# Patient Record
Sex: Female | Born: 1945
Health system: Southern US, Community
[De-identification: ages and names within clinical notes are randomized; demographics above are authoritative.]

## PROBLEM LIST (undated history)

## (undated) ENCOUNTER — Emergency Department (HOSPITAL_COMMUNITY): Disposition: A | Discharge status: Home / Self Care | Payer: Self-pay

## (undated) DIAGNOSIS — E785 Hyperlipidemia, unspecified: Secondary | ICD-10-CM

## (undated) DIAGNOSIS — I10 Essential (primary) hypertension: Secondary | ICD-10-CM

## (undated) DIAGNOSIS — I639 Cerebral infarction, unspecified: Secondary | ICD-10-CM

## (undated) DIAGNOSIS — B1920 Unspecified viral hepatitis C without hepatic coma: Secondary | ICD-10-CM

## (undated) DIAGNOSIS — E079 Disorder of thyroid, unspecified: Secondary | ICD-10-CM

## (undated) DIAGNOSIS — T7840XA Allergy, unspecified, initial encounter: Secondary | ICD-10-CM

## (undated) DIAGNOSIS — Z9221 Personal history of antineoplastic chemotherapy: Secondary | ICD-10-CM

## (undated) DIAGNOSIS — C801 Malignant (primary) neoplasm, unspecified: Secondary | ICD-10-CM

## (undated) HISTORY — DX: Malignant (primary) neoplasm, unspecified: C80.1

## (undated) HISTORY — DX: Hyperlipidemia, unspecified: E78.5

## (undated) HISTORY — DX: Allergy, unspecified, initial encounter: T78.40XA

## (undated) HISTORY — DX: Unspecified viral hepatitis C without hepatic coma: B19.20

## (undated) HISTORY — PX: FRACTURE SURGERY: SHX138

## (undated) HISTORY — DX: Disorder of thyroid, unspecified: E07.9

## (undated) HISTORY — PX: MASTECTOMY: SHX3

## (undated) HISTORY — PX: LEG SURGERY: SHX1003

## (undated) HISTORY — PX: BREAST SURGERY: SHX581

---

## 2003-08-16 HISTORY — PX: KNEE SURGERY: SHX244

## 2004-04-15 ENCOUNTER — Emergency Department (HOSPITAL_COMMUNITY): Admission: EM | Admit: 2004-04-15 | Discharge: 2004-04-16 | Payer: Self-pay | Admitting: Emergency Medicine

## 2004-04-20 ENCOUNTER — Ambulatory Visit (HOSPITAL_COMMUNITY): Admission: RE | Admit: 2004-04-20 | Discharge: 2004-04-20 | Payer: Self-pay | Admitting: Orthopedic Surgery

## 2004-05-05 ENCOUNTER — Observation Stay (HOSPITAL_COMMUNITY): Admission: RE | Admit: 2004-05-05 | Discharge: 2004-05-07 | Payer: Self-pay | Admitting: Orthopedic Surgery

## 2006-08-15 HISTORY — PX: WRIST SURGERY: SHX841

## 2007-05-12 ENCOUNTER — Inpatient Hospital Stay (HOSPITAL_COMMUNITY): Admission: EM | Admit: 2007-05-12 | Discharge: 2007-05-17 | Payer: Self-pay | Admitting: Emergency Medicine

## 2007-05-14 ENCOUNTER — Encounter (INDEPENDENT_AMBULATORY_CARE_PROVIDER_SITE_OTHER): Payer: Self-pay | Admitting: Internal Medicine

## 2007-05-15 ENCOUNTER — Ambulatory Visit: Payer: Self-pay | Admitting: Physical Medicine & Rehabilitation

## 2007-05-24 ENCOUNTER — Ambulatory Visit: Payer: Self-pay | Admitting: Family Medicine

## 2007-05-24 ENCOUNTER — Encounter (INDEPENDENT_AMBULATORY_CARE_PROVIDER_SITE_OTHER): Payer: Self-pay | Admitting: Nurse Practitioner

## 2007-05-24 LAB — CONVERTED CEMR LAB
ALT: 61 units/L — ABNORMAL HIGH (ref 0–35)
AST: 54 units/L — ABNORMAL HIGH (ref 0–37)
Albumin: 4.3 g/dL (ref 3.5–5.2)
Alkaline Phosphatase: 96 units/L (ref 39–117)
BUN: 10 mg/dL (ref 6–23)
Basophils Relative: 0 % (ref 0–1)
Creatinine, Ser: 0.55 mg/dL (ref 0.40–1.20)
Eosinophils Absolute: 0.1 10*3/uL (ref 0.0–0.7)
Glucose, Bld: 88 mg/dL (ref 70–99)
HCT: 43.9 % (ref 36.0–46.0)
Hemoglobin: 15.2 g/dL — ABNORMAL HIGH (ref 12.0–15.0)
Lymphs Abs: 3.8 10*3/uL — ABNORMAL HIGH (ref 0.7–3.3)
MCHC: 34.6 g/dL (ref 30.0–36.0)
MCV: 99.3 fL (ref 78.0–100.0)
Neutro Abs: 7.5 10*3/uL (ref 1.7–7.7)
Neutrophils Relative %: 62 % (ref 43–77)
Platelets: 291 10*3/uL (ref 150–400)
RDW: 12 % (ref 11.5–14.0)
WBC: 12.1 10*3/uL — ABNORMAL HIGH (ref 4.0–10.5)

## 2007-06-14 ENCOUNTER — Encounter (INDEPENDENT_AMBULATORY_CARE_PROVIDER_SITE_OTHER): Payer: Self-pay | Admitting: Nurse Practitioner

## 2007-06-14 ENCOUNTER — Ambulatory Visit: Payer: Self-pay | Admitting: *Deleted

## 2007-06-14 ENCOUNTER — Ambulatory Visit: Payer: Self-pay | Admitting: Family Medicine

## 2007-06-25 ENCOUNTER — Encounter: Admission: RE | Admit: 2007-06-25 | Discharge: 2007-06-25 | Payer: Self-pay | Admitting: Nurse Practitioner

## 2007-06-29 ENCOUNTER — Encounter: Admission: RE | Admit: 2007-06-29 | Discharge: 2007-06-29 | Payer: Self-pay | Admitting: Nurse Practitioner

## 2007-07-07 ENCOUNTER — Inpatient Hospital Stay (HOSPITAL_COMMUNITY): Admission: EM | Admit: 2007-07-07 | Discharge: 2007-07-13 | Payer: Self-pay | Admitting: Emergency Medicine

## 2007-08-28 ENCOUNTER — Encounter: Admission: RE | Admit: 2007-08-28 | Discharge: 2007-11-26 | Payer: Self-pay | Admitting: Orthopedic Surgery

## 2007-11-06 ENCOUNTER — Encounter: Admission: RE | Admit: 2007-11-06 | Discharge: 2007-11-13 | Payer: Self-pay | Admitting: Nurse Practitioner

## 2007-11-22 ENCOUNTER — Ambulatory Visit: Payer: Self-pay | Admitting: Internal Medicine

## 2007-12-18 ENCOUNTER — Ambulatory Visit: Payer: Self-pay | Admitting: Internal Medicine

## 2007-12-21 ENCOUNTER — Encounter: Admission: RE | Admit: 2007-12-21 | Discharge: 2007-12-21 | Payer: Self-pay | Admitting: Family Medicine

## 2008-01-01 ENCOUNTER — Ambulatory Visit: Payer: Self-pay | Admitting: Family Medicine

## 2008-03-19 ENCOUNTER — Ambulatory Visit: Payer: Self-pay | Admitting: Family Medicine

## 2008-03-19 LAB — CONVERTED CEMR LAB
Albumin: 4 g/dL (ref 3.5–5.2)
Basophils Absolute: 0 10*3/uL (ref 0.0–0.1)
Basophils Relative: 0 % (ref 0–1)
Calcium: 9.2 mg/dL (ref 8.4–10.5)
Eosinophils Absolute: 0.1 10*3/uL (ref 0.0–0.7)
Eosinophils Relative: 1 % (ref 0–5)
Glucose, Bld: 87 mg/dL (ref 70–99)
HDL: 68 mg/dL (ref >39)
MCV: 102.7 fL — ABNORMAL HIGH (ref 78.0–100.0)
Monocytes Absolute: 0.8 10*3/uL (ref 0.1–1.0)
Monocytes Relative: 9 % (ref 3–12)
Neutro Abs: 5.8 10*3/uL (ref 1.7–7.7)
Neutrophils Relative %: 63 % (ref 43–77)
Platelets: 254 10*3/uL (ref 150–400)
RBC: 4.41 M/uL (ref 3.87–5.11)
RDW: 13.3 % (ref 11.5–15.5)
Sodium: 133 meq/L — ABNORMAL LOW (ref 135–145)
TSH: 3.783 microintl units/mL (ref 0.350–4.50)
Total CHOL/HDL Ratio: 2.5
Total Protein: 7.7 g/dL (ref 6.0–8.3)
Triglycerides: 65 mg/dL (ref <150)
VLDL: 13 mg/dL (ref 0–40)

## 2008-03-20 ENCOUNTER — Encounter (INDEPENDENT_AMBULATORY_CARE_PROVIDER_SITE_OTHER): Payer: Self-pay | Admitting: Family Medicine

## 2008-04-02 ENCOUNTER — Ambulatory Visit: Payer: Self-pay | Admitting: Internal Medicine

## 2008-05-02 ENCOUNTER — Ambulatory Visit (HOSPITAL_BASED_OUTPATIENT_CLINIC_OR_DEPARTMENT_OTHER): Admission: RE | Admit: 2008-05-02 | Discharge: 2008-05-02 | Payer: Self-pay | Admitting: General Surgery

## 2008-06-30 ENCOUNTER — Encounter: Admission: RE | Admit: 2008-06-30 | Discharge: 2008-06-30 | Payer: Self-pay | Admitting: Family Medicine

## 2008-07-31 ENCOUNTER — Ambulatory Visit: Payer: Self-pay | Admitting: Internal Medicine

## 2008-12-03 ENCOUNTER — Ambulatory Visit: Payer: Self-pay | Admitting: Internal Medicine

## 2008-12-05 ENCOUNTER — Encounter (INDEPENDENT_AMBULATORY_CARE_PROVIDER_SITE_OTHER): Payer: Self-pay | Admitting: *Deleted

## 2008-12-08 ENCOUNTER — Ambulatory Visit: Payer: Self-pay | Admitting: Internal Medicine

## 2009-03-27 ENCOUNTER — Ambulatory Visit: Payer: Self-pay | Admitting: Internal Medicine

## 2009-03-27 ENCOUNTER — Encounter (INDEPENDENT_AMBULATORY_CARE_PROVIDER_SITE_OTHER): Payer: Self-pay | Admitting: Internal Medicine

## 2009-03-27 LAB — CONVERTED CEMR LAB
Chloride: 98 meq/L (ref 96–112)
Microalb, Ur: 0.5 mg/dL (ref 0.00–1.89)

## 2009-04-22 ENCOUNTER — Ambulatory Visit: Payer: Self-pay | Admitting: Internal Medicine

## 2009-04-23 ENCOUNTER — Encounter: Payer: Self-pay | Admitting: Family Medicine

## 2009-04-23 LAB — CONVERTED CEMR LAB
BUN: 18 mg/dL (ref 6–23)
CO2: 23 meq/L (ref 19–32)
Calcium: 9.4 mg/dL (ref 8.4–10.5)
Chloride: 98 meq/L (ref 96–112)
Creatinine, Ser: 0.6 mg/dL (ref 0.40–1.20)
Glucose, Bld: 111 mg/dL — ABNORMAL HIGH (ref 70–99)
Sodium: 133 meq/L — ABNORMAL LOW (ref 135–145)

## 2009-05-21 ENCOUNTER — Encounter (INDEPENDENT_AMBULATORY_CARE_PROVIDER_SITE_OTHER): Payer: Self-pay | Admitting: Internal Medicine

## 2009-05-21 ENCOUNTER — Ambulatory Visit: Payer: Self-pay | Admitting: Internal Medicine

## 2009-05-21 LAB — CONVERTED CEMR LAB
Calcium: 9.5 mg/dL (ref 8.4–10.5)
Chloride: 103 meq/L (ref 96–112)
Creatinine, Ser: 0.6 mg/dL (ref 0.40–1.20)
Glucose, Bld: 96 mg/dL (ref 70–99)
Sodium: 136 meq/L (ref 135–145)

## 2009-06-26 ENCOUNTER — Ambulatory Visit: Payer: Self-pay | Admitting: Internal Medicine

## 2009-06-26 ENCOUNTER — Encounter (INDEPENDENT_AMBULATORY_CARE_PROVIDER_SITE_OTHER): Payer: Self-pay | Admitting: Internal Medicine

## 2009-06-26 LAB — CONVERTED CEMR LAB
BUN: 14 mg/dL (ref 6–23)
CO2: 20 meq/L (ref 19–32)
Calcium: 9.4 mg/dL (ref 8.4–10.5)
Glucose, Bld: 102 mg/dL — ABNORMAL HIGH (ref 70–99)
Potassium: 4.8 meq/L (ref 3.5–5.3)

## 2009-07-16 ENCOUNTER — Ambulatory Visit: Payer: Self-pay | Admitting: Internal Medicine

## 2009-08-18 ENCOUNTER — Encounter: Admission: RE | Admit: 2009-08-18 | Discharge: 2009-08-18 | Payer: Self-pay | Admitting: Family Medicine

## 2009-09-25 ENCOUNTER — Ambulatory Visit: Payer: Self-pay | Admitting: Internal Medicine

## 2009-10-22 ENCOUNTER — Ambulatory Visit: Payer: Self-pay | Admitting: Internal Medicine

## 2009-10-23 ENCOUNTER — Encounter (INDEPENDENT_AMBULATORY_CARE_PROVIDER_SITE_OTHER): Payer: Self-pay | Admitting: Internal Medicine

## 2009-10-23 LAB — CONVERTED CEMR LAB
Calcium: 9.7 mg/dL (ref 8.4–10.5)
Cholesterol: 166 mg/dL (ref 0–200)
Creatinine, Ser: 0.63 mg/dL (ref 0.40–1.20)
Glucose, Bld: 87 mg/dL (ref 70–99)
Sodium: 139 meq/L (ref 135–145)
Total CHOL/HDL Ratio: 2.8

## 2009-10-29 ENCOUNTER — Ambulatory Visit: Payer: Self-pay | Admitting: Internal Medicine

## 2010-01-18 ENCOUNTER — Ambulatory Visit: Payer: Self-pay | Admitting: Internal Medicine

## 2010-01-18 LAB — CONVERTED CEMR LAB
ALT: 38 units/L — ABNORMAL HIGH (ref 0–35)
BUN: 14 mg/dL (ref 6–23)
Basophils Relative: 0 % (ref 0–1)
CO2: 25 meq/L (ref 19–32)
Calcium: 10.2 mg/dL (ref 8.4–10.5)
Chloride: 101 meq/L (ref 96–112)
Cholesterol: 163 mg/dL (ref 0–200)
Creatinine, Ser: 0.6 mg/dL (ref 0.40–1.20)
Eosinophils Absolute: 0.1 10*3/uL (ref 0.0–0.7)
Eosinophils Relative: 1 % (ref 0–5)
Glucose, Bld: 99 mg/dL (ref 70–99)
HCT: 44.6 % (ref 36.0–46.0)
Hgb A1c MFr Bld: 5.4 % (ref <5.7)
Lymphs Abs: 2.5 10*3/uL (ref 0.7–4.0)
MCHC: 32.5 g/dL (ref 30.0–36.0)
MCV: 102.5 fL — ABNORMAL HIGH (ref 78.0–100.0)
Monocytes Absolute: 0.6 10*3/uL (ref 0.1–1.0)
Monocytes Relative: 7 % (ref 3–12)
Neutrophils Relative %: 60 % (ref 43–77)
RBC: 4.35 M/uL (ref 3.87–5.11)
Total Bilirubin: 0.5 mg/dL (ref 0.3–1.2)
Total CHOL/HDL Ratio: 2.7
Triglycerides: 86 mg/dL (ref <150)
VLDL: 17 mg/dL (ref 0–40)
WBC: 8 10*3/uL (ref 4.0–10.5)

## 2010-01-20 ENCOUNTER — Ambulatory Visit: Payer: Self-pay | Admitting: Internal Medicine

## 2010-02-17 ENCOUNTER — Ambulatory Visit: Payer: Self-pay | Admitting: Internal Medicine

## 2010-02-17 LAB — CONVERTED CEMR LAB
BUN: 11 mg/dL (ref 6–23)
CO2: 23 meq/L (ref 19–32)
Chloride: 96 meq/L (ref 96–112)
Creatinine, Ser: 0.5 mg/dL (ref 0.40–1.20)
Potassium: 4.4 meq/L (ref 3.5–5.3)

## 2010-03-31 ENCOUNTER — Ambulatory Visit: Payer: Self-pay | Admitting: Internal Medicine

## 2010-03-31 LAB — CONVERTED CEMR LAB
BUN: 13 mg/dL (ref 6–23)
Chloride: 103 meq/L (ref 96–112)
Creatinine, Ser: 0.67 mg/dL (ref 0.40–1.20)
Glucose, Bld: 117 mg/dL — ABNORMAL HIGH (ref 70–99)
Potassium: 4.8 meq/L (ref 3.5–5.3)

## 2010-12-28 NOTE — Op Note (Signed)
NAME:  [name], [name] NO.:  [id number]   MEDICAL RECORD NO.:  [id number]          PATIENT TYPE:  INP   LOCATION:  5020                         FACILITY:  [facility]   PHYSICIAN:  Madelynn Done, MD  DATE OF BIRTH:  30-Nov-1945   DATE OF PROCEDURE:  07/07/2007  DATE OF DISCHARGE:                               OPERATIVE REPORT   PREOPERATIVE DIAGNOSIS:  1. Left displaced intra-articular distal radius fracture, three or      more fragments.  2. Tobacco use.  3. History of cerebrovascular accident and left-sided weakness.   POSTOPERATIVE DIAGNOSIS:  1. Left displaced intra-articular distal radius fracture, three or      more fragments.  2. Tobacco use.  3. History of cerebrovascular accident and left-sided weakness.   SURGEON:  Madelynn Done, M.D. who was scrubbed and present for the  entire procedure.   ASSISTANT SURGEON:  None.   PROCEDURE:  1. Open treatment of left distal radius fracture, intra-articular      fracture with three or more fragments with internal fixation.  2. Radiograph three views, left wrist,stress radiography  3. Brachial radialis tenotomy, left wrist.   SURGICAL IMPLANTS:  Hand innovation, the volar distal radius plate with  three proximal bicortical screws and distal locking pegs and two  partially threaded locking pegs.  VITOSS 5 mL of bone graft substitute.   ANESTHESIA:  Infraclavicular block with IV sedation.   TOURNIQUET TIME:  75 minutes at 250 mmHg.   BLOOD LOSS:  Minimal.   INDICATIONS:  Michelle Sosa is a 65 year old, right-hand-dominant  female who fell while at the grocery store.  She landed on her left  wrist.  She sustained a displaced intra-articular distal radius  fracture.  The patient was seen and evaluated in the emergency  department.  We talked about the displacement of the fracture.  We felt  it was amenable to open reduction and internal fixation.   Risks, benefits and alternatives were discussed in  detail with the  patient and signed informed consent was obtained on the day of surgery.  Risks include but not limited to bleeding, infection, damage to nearby  nerves and arteries, nonunion, malunion, hardware failure, need for  further surgical intervention, loss of motion of the wrist and digits.   DESCRIPTION OF PROCEDURE:  The patient was properly identified in preop  holding area, marked with a permanent marker. [name] was made  on the left  wrist  to indicate correct operative site.  The infraclavicular block  was performed by Anesthesia.  The patient tolerated this well.  The  patient then brought back to the operating room.  She received  preoperative antibiotics prior to any skin incision.  A well-padded  tourniquet was then placed on the left brachium and sealed with a 1000  drape after to the appropriate position.  The left upper extremity was  prepped with DuraPrep and sterilely draped.  Time-out was called.  The  correct site was identified.  The procedure was then begun.  Using a  standard FCR approach to the distal radius and after adequate Esmarch  exsanguination, the tourniquet was then insufflated.  Dissection was  carried down through the skin and subcutaneous tissues.  The FCR tendon  was identified and retracted ulnarly.  The FPL was then identified.  Care was taken to protect the cutaneous veins.  Dissection was carried  down to identify the pronator quadratus.  An L-shaped pronator quadratus  flap was then elevated ulnarly.  The fracture site was then exposed.  An  open reduction was then performed.  There were several fragments  displaced intra-articular which extended into the articular surface.  There was a defect volarly extending dorsally and this, therefore, the  bone graft substitute, VITOSS, was then placed within the defect and  also in order to help support the radial and central columns to the  wrist.  Following placement of the bone graft the hand  innovations plate  was placed volarly.  The oblong screw hole proximally was then  appropriately drilled and then the position of the plate was then  confirmed using the mini C-arm.  After it was felt that adequate  position of the plate, the distal locking screws and pegs were then  appropriately drilled.  Beginning at the ulnar corner, the 2 most ulnar  screws were then placed.  One partially-threaded locking peg and one  fully-threaded pegs were then placed after the appropriate measurement.  The remaining pegs were then placed moving radially.  One more locking  partially-threaded peg was then placed in the central column.  The  remaining pegs were nonthreaded.  Following placement of the distal  fixation, two more proximal screws were then placed after the  appropriate drill guide, drill and depth gauge measurement.   Final mini C-arm images were then obtained and live flouroscopy and  stress imaging.  The wound was then thoroughly irrigated.  The pronator  quadratus was loosely tacked back with a 3-0 Vicryl to provide some  layer between the tendons in the plate.  The tourniquet was then  deflated.  Good hemostasis was obtained.  The subcutaneous tissues were  then closed with 4-0 Vicryl suture and then the skin was then closed  with a running 4-0 nylon horizontal mattress suture.  Adaptic dressing  and sterile compressive dressing were then applied.  The distal  radioulnar joint was assessed.  There did not appear to be any  instability after fixation of the distal radius nor was there any under  stress radiography of the wrist.  There did not appear to be any  widening of the intercarpal ligament.   The patient was then placed in a well molded sugar-tong splint for pain  control.  The patient was then taken to recovery room in good condition.   POSTOPERATIVE PLAN:  The patient is going to be admitted for IV  antibiotics and pain control.  We will see her back at the at 2-week   mark for wound check and suture removal.  X-rays at that visit.  Total  of 3 weeks long-arm immobilization and 3 more weeks of short arm  immobilization.  See her back at the 2-week mark, 3-week mark, 4-1/2  week mark, and then the 6-week mark.   Radiograph reviews of left wrist do show the internal fixation plate,  there appears to be good  alignment in all three planes.      Madelynn Done, MD  Electronically Signed     FWO/MEDQ  D:  07/07/2007  T:  07/09/2007  Job:  [id number]

## 2010-12-28 NOTE — Discharge Summary (Signed)
[name], PLANT NO.:  [id number]   MEDICAL RECORD NO.:  [id number]          PATIENT TYPE:  INP   LOCATION:  3034                         FACILITY:  [facility]   PHYSICIAN:  Marcellus Scott, MD     DATE OF BIRTH:  1946/06/06   DATE OF ADMISSION:  05/12/2007  DATE OF DISCHARGE:                               DISCHARGE SUMMARY   DISCHARGE DIAGNOSES:  1. Acute right brain stroke with left hemiparesis.  2. Hypertension.  3. E-coli urinary tract infection.  4. Tobacco abuse.  5. Alcohol abuse.  6. Mild transaminitis.   DISCHARGE MEDICATIONS:  1. Enteric coated aspirin 81 mg p.o. daily.  2. Hydrochlorothiazide  12.5 mg p.o. daily.  3. Lisinopril 10 mg p.o. daily.  4. Metoprolol 25 mg p.o. b.i.d.  5. Multivitamins 1 p.o. daily.  6. Nicotine 21 mg for 24 hour patch, 1 daily for 5 weeks and then      taper.  7. K-Dur 20 mEq p.o. daily.  8. Thiamin 100 mg p.o. daily.  9. Tylenol 650 mg p.o. q.4-6 hours p.r.n.  10.Senokot S 1 p.o. q.h.s.   PROCEDURES:  1. MRI of the brain without and with contrast.  Impression:      a.     Acute right hemisphere infarct affecting the thalamus and       posterior limb of the internal capsule.      b.     Premature atrophy with advanced small vessel disease.      c.     Small micro bleeds in the brain stem and in the right       hemisphere likely to due to chronic hypertension.      d.     No abnormal enhancement.  2. MRA of the head. Impression:  Unremarkable MRA of the intracranial      circulation without contrast.  3. MRA of the neck.  Impression:  Unremarkable MR and angiography of      the extra cranial circulation without and with contrast.  4. CT of the head without contrast.  Impression:      a.     Moderate chronic small vessel white matter ischemic changes       in both cerebral hemisphere's.  Any of these could potentially       represent an area of subacute infarction.      b.     Old bilateral basal ganglion lacunar  infarct or prominent       ventricular spaces.      c.     No intracranial hemorrhage.  5. Two-D echocardiogram on the 09/29.  Summary: Overall left      ventricular systolic function was normal.  Left ventricular      ejection fraction with estimated range being 60 to 65%.  There were      no left ventricular regional wall motion abnormalities.  Left      ventricular wall thickness was mildly increased.  Features were      consistent with mild diastolic dysfunction.  No intracardiac      thrombus was identified.  There was mild mitral annular      calcification.  6. Carotid Doppler's.  Summary:  Vertebral artery flow antegrade      bilaterally.  No significant right or left internal carotid artery      stenosis.   PERTINENT LABORATORY RESULTS:  Urine culture E. coli which is pan  sensitive.  Basic metabolic panel normal with BUN 12, creatinine 0.87,  homocystine of 7.3, hemoglobin A1c 5.3, TSH 6.183, lipid profile except  an HDL of 37 normal.  CBC is with hemoglobin 13.2, hematocrit 38.5,  blood alcohol level of 42 on admission.  Urine microscopy with many  bacteria, and positive for nitrite.   HOSPITAL COURSE AND PATIENT DISPOSITION:  For details of initial  admission please refer to the history and physical note.  In summary,  the patient is a pleasant 65 year old Caucasian female patient with  history of hypertension, noncompliant with medications, right breast  cancer status post mastectomy chemotherapy and radiation therapy,  hepatitis C secondary to IV drug abuse who presented with history of  left sided weakness and numbness on the morning of admission.  She  called EMS and was transferred to the emergency room.  In the emergency  room she was noted to have significant elevated blood pressures of  226/93.  On further evaluation revealed a left hemiparesis consistent  with acute right brain stroke over underlying previous infarcts and  malignant hypertension.  Also with  alcohol abuse.  The patient was  thereby admitted to telemetry for further evaluation and management.  Extensive evaluation was done including MRI, MRA of the head and neck,  echocardiogram and carotid Doppler's with findings as indicated above.  She was initially placed on full dose aspirin but with the micro bleeds  was reduced to a low dose aspirin.  Her blood pressure was addressed  with metoprolol, hydrochlorothiazide and lisinopril with good control.  She passed a swallow evaluation and her diet was advanced.  She was also  placed on Thiamin and multivitamins and monitored for withdraw symptoms  which she has not had.  Tobacco and alcohol cessation counseling has  been provided.  Physical therapy as worked with the patient.  Patient at  this time is only symptomatic of her left extremity hemiparesis with  upper extremity strength 4/5 and lower extremity strength 3/5.  Physical  therapy and occupational therapy have indicated the patient can either  be discharged home with Home Health versus SNF. Inpatient rehab had seen  her and thought she was an appropriate candidate, they have no beds  available at this time and future is still unclear.  However, patient  has indicated that she would be willing to go home with Home Health.  However if her weakness does not improve or it gets worse she would like  to be considered for SNF from home.  Social worker has revealed that  this maybe possible.  The patient at this time is stable for discharge.  The patient has also applied for emergency Medicaid which is pending.  Meanwhile, the services will be provided for her to see if she qualifies  with [facility]  for followup until her insurance comes through.  Patient has also been instructed to seek immediate medical attention if  there is any further deterioration in her condition.      Marcellus Scott, MD  Electronically Signed     AH/MEDQ  D:  05/17/2007  T:  05/17/2007  Job:  [id number]

## 2010-12-28 NOTE — Discharge Summary (Signed)
Michelle Sosa, Michelle Sosa NO.:  [id number]   MEDICAL RECORD NO.:  [id number]          PATIENT TYPE:  INP   LOCATION:  5020                         FACILITY:  [facility]   PHYSICIAN:  Madelynn Done, MD  DATE OF BIRTH:  1945-10-08   DATE OF ADMISSION:  07/07/2007  DATE OF DISCHARGE:  07/10/2007                               DISCHARGE SUMMARY   ADMISSION DIAGNOSES:  1. Left comminuted intraarticular distal radius fracture.  2. Cerebral vascular accident with history of cerebral vascular      accident with left-sided weakness.  3. Hypertension.  4. Chronic tobacco use.  5. Hepatitis C.   DISCHARGE DIAGNOSES:  1. Left comminuted intraarticular distal radius fracture.  2. Cerebral vascular accident with history of cerebral vascular      accident with left-sided weakness.  3. Hypertension.  4. Chronic tobacco use.  5. Hepatitis C.   PROCEDURES AND DATES:  Left wrist open reduction internal fixation on  July 07, 2007.   DISCHARGE MEDICATIONS:  1. Resume home medications and doses.  2. Colace 100 mg p.o. b.i.d.  3. Percocet 1 to 2 tablets every 4 to 6 hours as needed for pain.   REASON FOR ADMISSION:  Patient is a 65 year old female who sustained a  fall on an outstretched left wrist.  The patient presented to the  emergency department with pain and deformity to her left wrist.  She was  taken to the operating room on the day of admission to undergo surgery  for her left wrist.   HOSPITAL COURSE:  The patient was admitted postoperatively.  Pain  control was obtained with IV and oral pain medications.  She was  continued on postoperative antibiotics.  Physical therapy and OT were  consulted for postoperative evaluation and treatment.  She was able to  get up out of bed with the use of assistive devices.  The patient was  seen and examined on postoperative day #3 and felt ready to be  discharged to home versus a skilled nursing facility.  On the day of  discharge, the patient is afebrile, vital signs are stable and normal,  she was tolerating a regular diet, and her pain was controlled on oral  pain medications.   RECOMMENDATIONS AND DISPOSITION:  The patient is to follow up in my  office in 2 weeks.  She needs to keep the current splint on at all  times.  No weight through the left wrist.  Smoking cessation is highly  encouraged.  She is to continue with the above discharge medications.  Keep her hand elevated while at rest.  She needs to keep the bandage  clean and dry.  Prior to her discharge, the patient's discharge  instructions were explained to her, her questions were answered, and the  patient voiced understanding with the discharge instructions.  All  questions were addressed.   ADDENDUM: Pt was discharged to home on 07/11/2007. Pt wanted to go home  and had arranged assistance to do so at home.  Pt felt comfortable going  home.  She was given my cell phone number to call  if she has any  problems.  Her hospitalization was necessary to help arrange placement  and assistance for her to go home or to a skilled nursing facility.      Madelynn Done, MD  Electronically Signed     FWO/MEDQ  D:  07/10/2007  T:  07/10/2007  Job:  [id number]

## 2010-12-28 NOTE — Op Note (Signed)
NAME:  Michelle Sosa, Michelle Sosa             ACCOUNT NO.:  [id number]   MEDICAL RECORD NO.:  [id number]          PATIENT TYPE:  AMB   LOCATION:  DSC                          FACILITY:  [facility]   PHYSICIAN:  Cherylynn Ridges, M.D.    DATE OF BIRTH:  09-15-1945   DATE OF PROCEDURE:  05/02/2008  DATE OF DISCHARGE:                               OPERATIVE REPORT   PREOPERATIVE DIAGNOSIS:  Left subclavian Port-A-Cath.   POSTOPERATIVE DIAGNOSIS:  Left subclavian Port-A-Cath with irretrievable  vascular portion of the Port-A-Cath.   PROCEDURE:  Partial removal of Port-A-Cath.   SURGEON:  Cherylynn Ridges, MD.   ANESTHESIA:  Monitored anesthesia care with local anesthetic, 1%  Xylocaine without epinephrine.   ESTIMATED BLOOD LOSS:  Less than 20 mL.   COMPLICATIONS:  None.   CONDITION:  Stable.   FINDINGS:  The patient had a 65 year old Port-A-Cath that had been in  place for treatment of breast cancer.  She was urged to have it removed  by her primary care physicians at [facility] because of possibility of  infection.  There was no signs of infection when she came into the  office.  However, the patient desired the port to be removed, and  therefore, we scheduled her to have it removed.   OPERATION:  The patient was taken to the operating room, and placed on  the table in the supine position.  After an adequate amount of IV  sedation was given, she was prepped and draped in usual sterile manner  exposing the chest wall in the subclavian area.   We marked the area of the previous incision for the port of her Port-A-  Cath, and then anesthetized the area with 1% Xylocaine.  We made an  incision around the previous scar to be closed primarily, and dissected  down to subcutaneous tissue, down to the port was that the superior  aspect of the incision.  We dissected it off the port from its scarred  capsule and an attempt was made to retrieve the catheter from the pocket  side of the port, however, but  it was snagging or pulling, which did not  allow the catheter to pull through, and therefore, the decision was made  to make a counter-incision at the subclavian site in order to continue  to remove the catheter.   We anesthetized the area in the subclavian area where the catheter went  deep underneath the clavicle and into the vein.  We made a transverse  incision initially about 1-1/2 to 2 cm long and elongated that to about  4 cm long after we had difficulty removing the catheter.   We dissected on to the subcu and to the area of the catheter, and we  were able to dissect and encircled it easily.  However, on attempts to  retrieve it from the subclavian sites from the vein down towards the  deltopectoral and the clavipectoral groove, it was unsuccessful removing  the catheter.  All we can manage to do is stretch it and perhaps pull it  back some as we demonstrated on the fluoroscopy.  However, we cannot  remove the catheter.  The catheter was also tethered to the subcutaneous  tissue between the port site in the tunnel, which we do not free up  either mainly because at some point we decided that the catheter was not  retrievable from the vein.   Attempts to dissect down underneath the clavipectoral and the  deltopectoral fascia into the area where the vein may have gone directly  into the vein to cause some venous bleeding possibly secondary to some  tearing of the subclavian vein.  This was not enough to require any type  of primary venous vascular repair.  We were able to take care of it  primarily with pressure.  Once this was done, we did use fluoroscopy to  confirm that the catheter was in the innominate vein, which we could  demonstrate.  However, it did not abut or move at all upon attempts to  remove it from the vein itself.  We pulled up on an appropriate amount  of tension, did not break the catheter.  Although, there was a tear on  superior wall, and therefore, the  decision was made to leave the  catheter in place.  We attempted to access the port with a Huber needle  and saline, and could not flush or aspirate from the catheter, and once  we had decided to cut the catheter in the tunnel between the port site  and the tunnel site, there was clot in the catheter itself.   We placed two medium-sized clips on the catheter completely occluding it  just before it went deep into the tunnel up to the subclavian site, and  also two clips on the catheter as it just before dogged on into the  subclavian vein in order to occlude it.  We allowed the catheter to fall  back into its natural position, and then transected the catheter just  before it entered the tunnel.  This left a significant amount of  catheter in the chest wall, and in case attempts were made to retrieve  it in the future, which I would not recommend based on the fact that  this was very difficult to retrieve currently, and will probably require  some type of thoracic procedure.   We irrigated with saline solution, and then we closed in several layers.  A 3-0 Vicryl layer was used in subcu, and then the skin above the port  site and the counter-incision site were closed using running  subcuticular stitch with 4-0 Monocryl.  Dermabond, Steri-Strips, and  Tegaderm were applied as final dressing.  All counts were correct.      Cherylynn Ridges, M.D.  Electronically Signed     JOW/MEDQ  D:  05/02/2008  T:  05/03/2008  Job:  [id number]

## 2010-12-28 NOTE — H&P (Signed)
NAME:  Michelle Sosa, Michelle Sosa NO.:  [id number]   MEDICAL RECORD NO.:  [id number]          PATIENT TYPE:  INP   LOCATION:  1833                         FACILITY:  [facility]   PHYSICIAN:  Elliot Cousin, M.D.    DATE OF BIRTH:  01-26-1946   DATE OF ADMISSION:  05/12/2007  DATE OF DISCHARGE:                              HISTORY & PHYSICAL   PRIMARY CARE PHYSICIAN:  The patient is unassigned.   CHIEF COMPLAINT:  Left-sided numbness and weakness.   HISTORY OF PRESENT ILLNESS:  The patient is a 65 year old woman with a  past medical history significant for breast cancer and hypertension, who  presents to the emergency department with a chief complaint of left-  sided weakness and numbness.  The patient was in her usual state of  health this morning.  She did some shopping at the [employer] at  around 9 o'clock a.m.  She proceeded to go to another grocery store at  approximately 10:30 a.m.  While in the grocery store, she suddenly felt  her left arm and leg go numb.  They began to feel heavy.  She tried to  walk and noticed that she was having difficulty because her left leg was  weak.  She was able to walk to her car and proceeded to drive home  without any difficulty given that she was using her right arm and right  leg to steer and brake.  When she attempted to get out of the car, she  found it much more difficult to ambulate to the door.  She held on to  the side of the car and on the railing of her porch.  She proceeded into  the house to lay down.  She became nervous and drank two beers.  As her  symptoms worsened, she decided to call EMS for further assistance.  She  denies associated right-sided weakness and numbness.  She denies any  associated dizziness, headache, slurred speech, difficulty speaking,  difficulty swallowing, visual changes, incontinence of her bladder or  bowels, shortness of breath, palpitations, or chest pain.  She decided  to take two aspirin  earlier today thinking that she may be having a  stroke.   During the evaluation in the emergency department, the patient was noted  to be quite hypertensive on arrival to the emergency department with a  blood pressure of 226/93. A CT scan of her head was ordered by the  emergency department physician and it revealed moderate chronic small  vessel white matter ischemic changes in both hemispheres, old bilateral  basal ganglia lacunar infarcts or prominent perivascular spaces, and no  intracranial hemorrhage.  Her lab data are significant for a serum  potassium of 3.3, serum sodium of 130, and an alcohol level of 42.  The  patient will be admitted for further evaluation and management.   PAST MEDICAL HISTORY:  1. History of hypertension, diagnosed during the hospitalization in      2005.  The patient was lost to follow-up.  She has not been on any      antihypertensive medications since then.  2.  Right-sided breast cancer status post mastectomy and status post      chemotherapy and radiation therapy in 1987.  3. Status post ORIF of the left knee in 2005.  4. Hepatitis C, probably secondary to remote history of intravenous      drug use in the 1970s.  5. History of right leg fracture, status post surgery.   MEDICATIONS:  The patient takes no routine medications, although  occasionally she will take an aspirin but certainly not daily.   ALLERGIES:  The patient is allergic to PENICILLIN and SULFA DRUGS.   SOCIAL HISTORY:  The patient is single.  She has no children.  [place]  She is currently unemployed and is  receiving unemployment benefits.  She is a former Estate agent.  She smokes one-pack of cigarettes per day and has been  doing so for 40 years.  She drinks approximately one six-pack of beer  daily.  She denies any illicit drug use.  She acknowledges a remote  history of intravenous drug use in the early 1970s.    FAMILY HISTORY:  Her mother is 63 years of age and has a history of  hypertension and an abdominal aortic aneurysm.  Her father died at 64  years of age of complications of pulmonary interstitial fibrosis.  He  did have a history of heart disease.   REVIEW OF SYSTEMS:  Positive for anxiousness, otherwise negative.   PHYSICAL EXAMINATION:  VITAL SIGNS:  Temperature 97.5, blood pressure  226/93, repeated at 164/81 following labetalol.  Pulse 88, respiratory  rate 18, oxygen saturation 97% on room air.  GENERAL:  The patient is a pleasant 65 year old Caucasian woman who is  currently sitting up in bed in no acute distress.  HEENT: Head is normocephalic, nontraumatic.  Pupils equal, round,  reactive to light.  Extraocular muscles are intact.  Conjunctivae are  clear.  Sclerae are white.  Tympanic membranes clear bilaterally.  Nasal  mucosa is mildly dry.  No sinus tenderness.  Oropharynx reveals very  poor dentition with multiple caries, cavities, and gingivitis.  Mucous  membranes are mildly dry.  No posterior exudates or erythema.  NECK:  Supple.  No thyromegaly, no bruit, no JVD, no adenopathy.  LUNGS:  Clear to auscultation bilaterally.  HEART:  S1, S2 with no murmurs, rubs or gallops.  CHEST WALL:  Well-healed mastectomy scar on the right without any  tenderness or abnormal masses palpated.  BREAST:  Left breast is soft without any masses palpated.  ABDOMEN:  Positive bowel sounds, soft, nontender, nondistended.  No  hepatosplenomegaly, no masses palpated.  GU AND RECTAL:  Deferred.  EXTREMITIES:  Pedal pulses palpable bilaterally.  No pretibial edema and  no pedal edema.  NEUROLOGIC:  The patient is alert and oriented x3.  Cranial nerves II-  XII appear to be grossly intact with the exception of decrease in  sensation over the left side of the face.  Gag reflex is present.  No  obvious facial droop.  Strength of the left upper extremity is  approximately 4/5 and 5/5 of  the right  upper extremity.  Strength of  the left lower extremity is approximately 3+/ 5 and 5/5 of the right  lower extremity.  Pronator drift is positive on the left.  Sensation is  grossly deficient to soft and sharp touch on the left.  Cerebellar with  finger-to-nose testing reveals a slight overshoot on the left and within  normal limits on the right.  No obvious nystagmus.  Plantar reflexes  appear to be upgoing on the left and downgoing on the right.  Gait not  assessed.   ADMISSION LABORATORIES:  EKG reveals normal sinus rhythm with PVCs and a  heart rate of 89 beats per minute.  A wbc of 11.7, hemoglobin 14.6,  platelet count 229.  PTT 28, PT 13.5, INR 1.  Troponin I 0.01.  Sodium  130, potassium 3.4, chloride 98, BUN 8, creatinine 0.5, bicarbonate 21,  glucose 116.  Urinalysis revealed positive nitrites, many bacteria,  leukocyte esterase negative.  Alcohol level 42.   ASSESSMENT:  1. This is a 65 year old right-handed Caucasian woman who presents      with left-sided hemiparesis consistent with an acute right brain      stroke.  She also has evidence of previous infarcts per the CT scan      of the head performed in the emergency department.  She presented      to the emergency department today outside of the window for TPA      therapy.  2. Malignant hypertension.  The patient's presenting blood pressure      was 226/93.  The patient has a history of hypertension but was lost      to follow-up.  3. Alcohol use/abuse.  The patient admits to drinking a six-pack of      beer most days of the week if not daily.  She denies any history of      alcohol withdrawal symptoms or DTs.  She acknowledges that she      recently went without drinking for at least one week while she was      visiting a relative.  Her alcohol level was noted to be 42 in the      emergency department.  She does not appear to be intoxicated.  4. Mild hyponatremia and hypokalemia.  5. Bacteriuria with no complaints of  dysuria.  6. Mild leukocytosis.   PLAN:  1. The patient will be admitted for further evaluation and management.  2. She was given intravenous labetalol by the emergency department      physician.  3. Will continue blood pressure management with metoprolol,      hydrochlorothiazide and lisinopril.  Will add p.r.n. IV Lopressor      for a systolic blood pressure greater than or equal to 185 and a      diastolic blood pressure greater than or equal to 105.  4. Bedside swallow evaluation.  If she passes, will start a heart      healthy diet.  If she does not pass, will consult the speech      therapist for further evaluation.  5. Will start aspirin therapy at 325 mg daily.  6. Will start vitamin therapy with thiamine and a multivitamin with      iron once daily.  7. Tobacco and alcohol cessation counseling.  Will add Ativan p.r.n.  8. Prophylactic Protonix and Lovenox.  9. For further evaluation, will check a MRI and MRA of the brain and      head, carotid Dopplers, and a 2-D echocardiogram.  We will also      culture the urine and if convincingly positive, consider antibiotic      treatment.  10.Replete potassium chloride.      Elliot Cousin, M.D.  Electronically Signed     DF/MEDQ  D:  05/12/2007  T:  05/13/2007  Job:  [id number]

## 2010-12-31 NOTE — Discharge Summary (Signed)
NAMESHEMIKA, ROBBS NO.:  [id number]   MEDICAL RECORD NO.:  [id number]          PATIENT TYPE:  INP   LOCATION:  5020                         FACILITY:  [facility]   PHYSICIAN:  Madelynn Done, MD  DATE OF BIRTH:  June 16, 1946   DATE OF ADMISSION:  07/07/2007  DATE OF DISCHARGE:  07/13/2007                               DISCHARGE SUMMARY   ADDENDUM:  Covers November 25, 200 to July 13, 2007.  Mrs. Carolan was  continued in her hospital course secondary to placement issues.  She did  not have someone to go home with and someone to take care of her.  Therefore, she was a remained in the hospital until  the placement issue  and potentially skilled nursing facility was found.  The patient was  seen and examined on July 13, 2007.  She was comfortable going home  with the assistance of friends and family.  The patient was discharged  to home on September 11, 2006, in good condition.  On the day of  discharge, the patient was seen examined.  Her vital signs were stable  and normal.  She was tolerating a regular diet and felt ready to go  home.   This is an addendum to an already-dictated discharge summary.      Madelynn Done, MD  Electronically Signed     FWO/MEDQ  D:  07/26/2007  T:  07/27/2007  Job:  [id number]

## 2010-12-31 NOTE — Op Note (Signed)
NAMEEFFA, Michelle Sosa             ACCOUNT NO.:  [id number]   MEDICAL RECORD NO.:  [id number]          PATIENT TYPE:  OBV   LOCATION:  0474                         [facility]ACILITY:  [facility]   PHYSICIAN:  Madlyn Frankel. Charlann Boxer, M.D.  DATE OF BIRTH:  04/18/1946   DATE OF PROCEDURE:  05/05/2004  DATE OF DISCHARGE:                                 OPERATIVE REPORT   PREOPERATIVE DIAGNOSIS:  Displaced split pattern at lateral tibial plateau  fracture, left.   POSTOPERATIVE DIAGNOSIS:  Displaced split pattern at lateral tibial plateau  fracture, left.   PROCEDURE:  1.  Arthroscopically assisted open reduction/internal fixation of left      tibial plateau fracture.  2.  Diagnostic and operative left knee arthroscopy with hematoma debridement      as well as minor chondroplasty in the lateral compartment off the tibial      plateau surface.   SURGEON:  Madlyn Frankel. Charlann Boxer, M.D.   ASSISTANTDruscilla Brownie. Cherlynn June.   ANESTHESIA:  Spinal plus MAC.   BLOOD LOSS:  Minimal.   DRAINS:  None.   INDICATIONS FOR PROCEDURE:  Michelle Sosa is a 65 year old female who had  presented to the office on referral evaluation by Dr. Leonides Grills for  evaluation of left tibial plateau fracture.  After initial evaluation, CT  scan was obtained, which he already had but was not initially available for  my review.  Upon review of this, there is an indication of a 3-4 mm  displacement of the lateral tibial plateau surface.  This corresponded with  radiographs, indicating a majority split-type pattern to the tibial plateau.  After reviewing this with several of my partners, it was felt that at least  a diagnostic evaluation arthroscopically and exam under anesthesia would be  beneficial to maximize her potential return to normalcy as well as an early  range of motion.  Based on this, risks and benefits were reviewed with Ms.  Sosa, including the diagnostic and arthroscopic portion of the case.  Knee arthroscopy was  carried out to rule out and evaluate for a meniscal  pathology or significant chondral defects or loose bodies.  After reviewing  these risks and benefits, she consented for the above procedure.   PROCEDURE IN DETAIL:  Patient was brought to the operating theater.  Once  adequate anesthesia and preoperative antibiotics were administered, Ancef 1  gm, the patient was positioned supine.  The left lower extremity was then  prepped and draped in a sterile fashion.  Please note that preoperatively,  she does have a nondisplaced left distal fibular fracture.  We did place  into a double sugar-tong splint that was just placed on the distal one third  of the leg and covered with Coban.  This allowed [place] to prep this out with an  impervious stockinette as well as to prevent any further motion across this  fibular fracture that would cause a problem and cause it to become  displaced.  Following this, knee arthroscopy was carried out, utilizing a  standard inferolateral, superolateral, and inferomedial portals.  Following  debridement of the knee of  hematoma, the knee was evaluated.  We found that  the patellofemoral compartment was intact.  Other than hematoma, the medial  compartment was relatively intact with some grade 2 chondromalacia changes  over the medial femoral condyle, there was an intact meniscus.  The ACL was  intact.  Laterally, there was an intact meniscus with no evidence of tears.  There were some chondral defects noted, mainly on the anterolateral aspect  of the tibial plateau surface.  The fracture line was never identified;  however, I did not spend time debriding significant tissue in order to  identify it.  Following this and further debridement of the knee, the  arthroscopic instruments were removed, and attention was directed to fixing  the fracture.  X-ray was brought into the field.  Under radiographic  evaluation, the fracture site was identified.  A [place] clamp was  placed  on the fracture fragment to help it to be reduced.  This was confirmed  radiographically.  A guidewire for a 6.5 cannulated screw was then passed  from anterolateral to slightly posteromedial direction.  This was confirmed  radiographically and angulated slightly to capture this oblique pattern of  the fracture in an effort to possibly elevate this fracture fragment.  Following this guidewire position, we measured a 65 mm screw, and a 65 mm  cannulated 6.5 mm Titanium screw was passed.  We utilized a washer to help  with compression.  The pressure was applied, and under radiograph  evaluation, it confirmed reduction and elevation of the fracture fragments.  Final radiographs were obtained, and the guidewire was removed.  The wounds  were copiously irrigated with normal saline solution.  The wounds were then  reapproximated using a 2-0 Vicryl and 3-0 nylon for all portal sites.  Note  that there were two stab incisions for the reduction and passing of a  cannulated screw.  The portals were also reapproximated.  There were a total  of six stab-type incisions for this procedure.  The patient's knee was then  cleaned, dried, and dressed sterilely with Adaptic dressing, sponges, and a  bulky Jones dressing.  Her splint was removed.  The cam walker was  reapplied.  Ice packs applied to the knee, and a knee immobilizer  positioned.  The patient was then transferred to the recovery room in stable  condition.  She tolerated the procedure well without apparent complication.      MDO/MEDQ  D:  05/05/2004  T:  05/06/2004  Job:  [id number]

## 2011-05-16 LAB — BASIC METABOLIC PANEL
CO2: 27
Calcium: 9.4
Creatinine, Ser: 0.55
GFR calc Af Amer: >60
GFR calc non Af Amer: >60
Glucose, Bld: 125 — ABNORMAL HIGH

## 2011-05-24 LAB — CBC
MCHC: 34.2
MCV: 98.6
Platelets: 241
RDW: 12

## 2011-05-24 LAB — I-STAT 8, (EC8 V) (CONVERTED LAB)
BUN: 8
Bicarbonate: 23
Glucose, Bld: 105 — ABNORMAL HIGH
Hemoglobin: 16.7 — ABNORMAL HIGH
Sodium: 126 — ABNORMAL LOW

## 2011-05-24 LAB — DIFFERENTIAL
Basophils Absolute: 0.1
Basophils Relative: 1
Eosinophils Absolute: 0 — ABNORMAL LOW
Monocytes Relative: 6
Neutrophils Relative %: 73

## 2011-05-24 LAB — PROTIME-INR
INR: 1
Prothrombin Time: 13.2

## 2011-05-26 LAB — COMPREHENSIVE METABOLIC PANEL
ALT: 57 — ABNORMAL HIGH
ALT: 69 — ABNORMAL HIGH
AST: 65 — ABNORMAL HIGH
Albumin: 3.5
Alkaline Phosphatase: 103
BUN: 6
CO2: 22
CO2: 24
Calcium: 8.9
Chloride: 107
Chloride: 96
GFR calc Af Amer: >60
GFR calc non Af Amer: >60
GFR calc non Af Amer: >60
Glucose, Bld: 99
Potassium: 4.8
Sodium: 130 — ABNORMAL LOW
Sodium: 139
Total Bilirubin: 0.7
Total Bilirubin: 0.8

## 2011-05-26 LAB — CBC
HCT: 38.5
HCT: 42.5
Hemoglobin: 13.2
Hemoglobin: 14.6
MCV: 99.5
RBC: 3.88
RBC: 4.27
RDW: 12.3
WBC: 11.7 — ABNORMAL HIGH
WBC: 9.8

## 2011-05-26 LAB — BASIC METABOLIC PANEL
CO2: 26
Chloride: 101
GFR calc Af Amer: >60
Glucose, Bld: 101 — ABNORMAL HIGH
Sodium: 135

## 2011-05-26 LAB — LIPID PANEL
LDL Cholesterol: 78
Total CHOL/HDL Ratio: 3.5
VLDL: 13

## 2011-05-26 LAB — I-STAT 8, (EC8 V) (CONVERTED LAB)
Acid-base deficit: 3 — ABNORMAL HIGH
Bicarbonate: 20.7
Chloride: 98
HCT: 47 — ABNORMAL HIGH
Operator id: 285491
pCO2, Ven: 32.5 — ABNORMAL LOW

## 2011-05-26 LAB — URINALYSIS, ROUTINE W REFLEX MICROSCOPIC
Bilirubin Urine: NEGATIVE
Glucose, UA: NEGATIVE
Hgb urine dipstick: NEGATIVE
Ketones, ur: NEGATIVE
Protein, ur: NEGATIVE

## 2011-05-26 LAB — URINE CULTURE: Colony Count: >=100000

## 2011-05-26 LAB — HEMOGLOBIN A1C
Hgb A1c MFr Bld: 5.3
Mean Plasma Glucose: 111

## 2011-05-26 LAB — POCT I-STAT CREATININE: Creatinine, Ser: 0.5

## 2011-05-26 LAB — DIFFERENTIAL
Eosinophils Absolute: 0
Eosinophils Relative: 0
Lymphs Abs: 1.5
Monocytes Absolute: 0.5

## 2011-05-26 LAB — PROTIME-INR: Prothrombin Time: 13.5

## 2011-09-21 DIAGNOSIS — I10 Essential (primary) hypertension: Secondary | ICD-10-CM | POA: Diagnosis not present

## 2011-09-21 DIAGNOSIS — F17200 Nicotine dependence, unspecified, uncomplicated: Secondary | ICD-10-CM | POA: Diagnosis not present

## 2012-03-09 DIAGNOSIS — E785 Hyperlipidemia, unspecified: Secondary | ICD-10-CM | POA: Diagnosis not present

## 2012-03-09 DIAGNOSIS — E782 Mixed hyperlipidemia: Secondary | ICD-10-CM | POA: Diagnosis not present

## 2012-03-09 DIAGNOSIS — I10 Essential (primary) hypertension: Secondary | ICD-10-CM | POA: Diagnosis not present

## 2012-07-18 DIAGNOSIS — Z23 Encounter for immunization: Secondary | ICD-10-CM | POA: Diagnosis not present

## 2012-10-25 ENCOUNTER — Emergency Department (INDEPENDENT_AMBULATORY_CARE_PROVIDER_SITE_OTHER)
Admission: EM | Admit: 2012-10-25 | Discharge: 2012-10-25 | Disposition: A | Discharge status: Home / Self Care | Payer: Medicare Other | Source: Home / Self Care

## 2012-10-25 ENCOUNTER — Encounter (HOSPITAL_COMMUNITY): Payer: Self-pay

## 2012-10-25 DIAGNOSIS — I10 Essential (primary) hypertension: Secondary | ICD-10-CM | POA: Diagnosis not present

## 2012-10-25 HISTORY — DX: Essential (primary) hypertension: I10

## 2012-10-25 HISTORY — DX: Cerebral infarction, unspecified: I63.9

## 2012-10-25 LAB — BASIC METABOLIC PANEL
BUN: 12 mg/dL (ref 6–23)
Chloride: 99 mEq/L (ref 96–112)
Creatinine, Ser: 0.46 mg/dL — ABNORMAL LOW (ref 0.50–1.10)
Glucose, Bld: 100 mg/dL — ABNORMAL HIGH (ref 70–99)
Potassium: 4.3 mEq/L (ref 3.5–5.1)

## 2012-10-25 MED ORDER — POTASSIUM CHLORIDE CRYS ER 20 MEQ PO TBCR: 20.0000 meq | EXTENDED_RELEASE_TABLET | Freq: Every day | ORAL | Status: DC

## 2012-10-25 MED ORDER — IRBESARTAN 300 MG PO TABS: 300.0000 mg | ORAL_TABLET | Freq: Every day | ORAL | Status: DC

## 2012-10-25 MED ORDER — AMLODIPINE BESYLATE 10 MG PO TABS: 10.0000 mg | ORAL_TABLET | Freq: Every day | ORAL | Status: DC

## 2012-10-25 NOTE — ED Notes (Signed)
Patient has a history of high blood pressure Needs medication refills

## 2012-10-25 NOTE — ED Provider Notes (Signed)
History     CSN: [id number]  Arrival date & time 10/25/12  1147   None     Chief Complaint  Patient presents with  . Hypertension    (Consider location/radiation/quality/duration/timing/severity/associated sxs/prior treatment) HPI Patient is 67 year old female who presents to clinic for regular followup, blood pressure check, needs medication refill. She reports being in usual state of health, no chest pain or shortness of breath, no abdominal or urinary concerns. Patient reports taking medications as prescribed, checks her blood pressure regularly and reports numbers are usually less than 130/80.  History reviewed. No pertinent past medical history.  History reviewed. No pertinent past surgical history.  No family history of medical problem as per pt.   History  Substance Use Topics  . Smoking status: Not on file  . Smokeless tobacco: Not on file  . Alcohol Use: Not on file    OB History   Grav Para Term Preterm Abortions TAB SAB Ect Mult Living                  Review of Systems Review of Systems  Constitutional: Negative for fever, chills, diaphoresis, activity change, appetite change and fatigue.  HENT: Negative for ear pain, nosebleeds, congestion, facial swelling, rhinorrhea, neck pain, neck stiffness and ear discharge.   Eyes: Negative for pain, discharge, redness, itching and visual disturbance.  Respiratory: Negative for cough, choking, chest tightness, shortness of breath, wheezing and stridor.   Cardiovascular: Negative for chest pain, palpitations and leg swelling.  Gastrointestinal: Negative for abdominal distention.  Genitourinary: Negative for dysuria, urgency, frequency, hematuria, flank pain, decreased urine volume, difficulty urinating and dyspareunia.  Musculoskeletal: Negative for back pain, joint swelling, arthralgias and gait problem.  Neurological: Negative for dizziness, tremors, seizures, syncope, facial asymmetry, speech difficulty, weakness,  light-headedness, numbness and headaches.  Hematological: Negative for adenopathy. Does not bruise/bleed easily.  Psychiatric/Behavioral: Negative for hallucinations, behavioral problems, confusion, dysphoric mood, decreased concentration and agitation.    Allergies  Codeine; Penicillins; and Sulfa antibiotics  Home Medications   Current Outpatient Rx  Name  Route  Sig  Dispense  Refill  . amLODipine (NORVASC) 10 MG tablet   Oral   Take 10 mg by mouth daily.         . irbesartan (AVAPRO) 300 MG tablet   Oral   Take 300 mg by mouth at bedtime.         . potassium chloride SA (K-DUR,KLOR-CON) 20 MEQ tablet      20 mEq daily.           BP 166/69  Pulse 86  Temp(Src) 97.7 F (36.5 C) (Oral)  Resp 16  SpO2 97%  Physical Exam Physical Exam  Constitutional: Appears well-developed and well-nourished. No distress.  HENT: Normocephalic. External right and left ear normal. Oropharynx is clear and moist.  Eyes: Conjunctivae and EOM are normal. PERRLA, no scleral icterus.  Neck: Normal ROM. Neck supple. No JVD. No tracheal deviation. No thyromegaly.  CVS: RRR, S1/S2 +, no murmurs, no gallops, no carotid bruit.  Pulmonary: Effort and breath sounds normal, no stridor, rhonchi, wheezes, rales.  Abdominal: Soft. BS +,  no distension, tenderness, rebound or guarding.  Musculoskeletal: Normal range of motion. No edema and no tenderness.  Lymphadenopathy: No lymphadenopathy noted, cervical, inguinal. Neuro: Alert. Normal reflexes, muscle tone coordination. No cranial nerve deficit. Skin: Skin is warm and dry. No rash noted. Not diaphoretic. No erythema. No pallor.  Psychiatric: Normal mood and affect. Behavior, judgment, thought content normal.  ED Course  Procedures (including critical care time)  Labs Reviewed - No data to display No results found.  Hypertension  - Somewhat elevated on today's exam - Since patient is on ARB and potassium will check a BMP today to ensure  that electrolytes are stable - I have advised patient to continue checking her blood pressure regularly and to write it down, also advised to bring that record with her so that we can readjust medication dosage appropriately - Continue current blood pressure medication Norvasc and Avapro   MDM  Hypertension         Dorothea Ogle, MD 10/25/12 1300

## 2012-12-20 ENCOUNTER — Emergency Department (INDEPENDENT_AMBULATORY_CARE_PROVIDER_SITE_OTHER)
Admission: EM | Admit: 2012-12-20 | Discharge: 2012-12-20 | Disposition: A | Discharge status: Home / Self Care | Payer: Medicare Other | Source: Home / Self Care

## 2012-12-20 ENCOUNTER — Encounter (HOSPITAL_COMMUNITY): Payer: Self-pay

## 2012-12-20 DIAGNOSIS — I10 Essential (primary) hypertension: Secondary | ICD-10-CM

## 2012-12-20 DIAGNOSIS — R635 Abnormal weight gain: Secondary | ICD-10-CM

## 2012-12-20 LAB — CBC
Platelets: 240 10*3/uL (ref 150–400)
RDW: 12.4 % (ref 11.5–15.5)
WBC: 8.1 10*3/uL (ref 4.0–10.5)

## 2012-12-20 LAB — LIPID PANEL
HDL: 68 mg/dL (ref >39)
LDL Cholesterol: 105 mg/dL — ABNORMAL HIGH (ref 0–99)
Total CHOL/HDL Ratio: 2.8 RATIO
VLDL: 18 mg/dL (ref 0–40)

## 2012-12-20 LAB — TSH: TSH: 5.177 u[IU]/mL — ABNORMAL HIGH (ref 0.350–4.500)

## 2012-12-20 MED ORDER — ASPIRIN 81 MG PO TBEC: 81.0000 mg | DELAYED_RELEASE_TABLET | Freq: Every day | ORAL | Status: DC

## 2012-12-20 NOTE — ED Notes (Signed)
Follow up hypertension

## 2012-12-20 NOTE — ED Provider Notes (Signed)
History     CSN: [id number]  Arrival date & time 12/20/12  1006   None     Chief Complaint  Patient presents with  . Follow-up   67 year old female who is here for followup of her hypertension as well as overall health. The patient has been charting her blood pressure at home but has not brought in these numbers for me to review. The patient states that she quit smoking a year ago and has gained about 20 pounds in the last one year. She is concerned about her weight gain.. She states that she had a lipid panel checked at [facility]  last July. She had a stroke 2 years ago which has left her with permanent weakness and debility and she uses a cane to walk. HPI  Past Medical History  Diagnosis Date  . Hypertension   . Stroke     Past Surgical History  Procedure Laterality Date  . Mastectomy Right     No family history on file.  History  Substance Use Topics  . Smoking status: Former Games developer  . Smokeless tobacco: Not on file  . Alcohol Use: No    OB History   Grav Para Term Preterm Abortions TAB SAB Ect Mult Living                  Review of Systems Constitutional: Negative for fever, chills, diaphoresis, activity change, appetite change and fatigue.  HENT: Negative for ear pain, nosebleeds, congestion, facial swelling, rhinorrhea, neck pain, neck stiffness and ear discharge.  Eyes: Negative for pain, discharge, redness, itching and visual disturbance.  Respiratory: Negative for cough, choking, chest tightness, shortness of breath, wheezing and stridor.  Cardiovascular: Negative for chest pain, palpitations and leg swelling.  Gastrointestinal: Negative for abdominal distention.  Genitourinary: Negative for dysuria, urgency, frequency, hematuria, flank pain, decreased urine volume, difficulty urinating and dyspareunia.  Musculoskeletal: Negative for back pain, joint swelling, arthralgias and gait problem.  Neurological: Negative for dizziness, tremors, seizures, syncope,  facial asymmetry, speech difficulty, weakness, light-headedness, numbness and headaches.  Hematological: Negative for adenopathy. Does not bruise/bleed easily.  Psychiatric/Behavioral: Negative for hallucinations, behavioral problems, confusion, dysphoric mood, decreased concentration and agitation.   Allergies  Codeine; Penicillins; and Sulfa antibiotics  Home Medications   Current Outpatient Rx  Name  Route  Sig  Dispense  Refill  . amLODipine (NORVASC) 10 MG tablet   Oral   Take 1 tablet (10 mg total) by mouth daily.   30 tablet   3   . aspirin 81 MG EC tablet   Oral   Take 1 tablet (81 mg total) by mouth daily. Swallow whole.   30 tablet   12   . irbesartan (AVAPRO) 300 MG tablet   Oral   Take 1 tablet (300 mg total) by mouth at bedtime.   30 tablet   3   . potassium chloride SA (K-DUR,KLOR-CON) 20 MEQ tablet   Oral   Take 1 tablet (20 mEq total) by mouth daily.   30 tablet   3     BP 142/56  Pulse 68  Temp(Src) 97.7 F (36.5 C) (Oral)  Resp 17  SpO2 99%  Physical Exam Constitutional: Appears well-developed and well-nourished. No distress.  HENT: Normocephalic. External right and left ear normal. Oropharynx is clear and moist.  Eyes: Conjunctivae and EOM are normal. PERRLA, no scleral icterus.  Neck: Normal ROM. Neck supple. No JVD. No tracheal deviation. No thyromegaly.  CVS: RRR, S1/S2 +, no murmurs, no  gallops, no carotid bruit.  Pulmonary: Effort and breath sounds normal, no stridor, rhonchi, wheezes, rales.  Abdominal: Soft. BS +, no distension, tenderness, rebound or guarding.  Musculoskeletal: Normal range of motion. No edema and no tenderness.  Lymphadenopathy: No lymphadenopathy noted, cervical, inguinal. Neuro: Alert. Normal reflexes, muscle tone coordination. No cranial nerve deficit.  Skin: Skin is warm and dry. No rash noted. Not diaphoretic. No erythema. No pallor.  Psychiatric: Normal mood and affect. Behavior, judgment, thought content  normal.   ED Course  Procedures (including critical care time)  Labs Reviewed  LIPID PANEL  CBC  TSH   No results found.   1. Hypertension continue current medications, continue charting blood pressure at home, followup in 2 months   2. Weight gain and check a TSH    3. Previous CVA last check a lipid panel, patient advised to continue with aspirin    MDM           Richarda Overlie, MD 12/20/12 1035

## 2013-01-02 ENCOUNTER — Telehealth: Payer: Self-pay | Admitting: General Practice

## 2013-02-13 ENCOUNTER — Ambulatory Visit: Payer: Medicare Other | Attending: Family Medicine | Admitting: Internal Medicine

## 2013-02-13 VITALS — BP 162/80 | HR 75 | Temp 98.2°F | Resp 15 | Ht 63.0 in | Wt 157.0 lb

## 2013-02-13 DIAGNOSIS — R5381 Other malaise: Secondary | ICD-10-CM | POA: Diagnosis not present

## 2013-02-13 DIAGNOSIS — R5383 Other fatigue: Secondary | ICD-10-CM | POA: Insufficient documentation

## 2013-02-13 DIAGNOSIS — I10 Essential (primary) hypertension: Secondary | ICD-10-CM | POA: Diagnosis not present

## 2013-02-13 DIAGNOSIS — R946 Abnormal results of thyroid function studies: Secondary | ICD-10-CM | POA: Diagnosis not present

## 2013-02-13 DIAGNOSIS — E039 Hypothyroidism, unspecified: Secondary | ICD-10-CM

## 2013-02-13 DIAGNOSIS — Z8673 Personal history of transient ischemic attack (TIA), and cerebral infarction without residual deficits: Secondary | ICD-10-CM | POA: Insufficient documentation

## 2013-02-13 LAB — BASIC METABOLIC PANEL
BUN: 13 mg/dL (ref 6–23)
Calcium: 10.2 mg/dL (ref 8.4–10.5)
Chloride: 102 mEq/L (ref 96–112)
Creat: 0.64 mg/dL (ref 0.50–1.10)

## 2013-02-13 LAB — T4, FREE: Free T4: 1.08 ng/dL (ref 0.80–1.80)

## 2013-02-13 LAB — TSH: TSH: 6.16 u[IU]/mL — ABNORMAL HIGH (ref 0.350–4.500)

## 2013-02-13 MED ORDER — IRBESARTAN 300 MG PO TABS: 300.0000 mg | ORAL_TABLET | Freq: Every day | ORAL | Status: DC

## 2013-02-13 MED ORDER — ASPIRIN 81 MG PO TBEC: 81.0000 mg | DELAYED_RELEASE_TABLET | Freq: Every day | ORAL | Status: DC

## 2013-02-13 MED ORDER — POTASSIUM CHLORIDE CRYS ER 20 MEQ PO TBCR: 20.0000 meq | EXTENDED_RELEASE_TABLET | Freq: Every day | ORAL | Status: DC

## 2013-02-13 MED ORDER — AMLODIPINE BESYLATE 10 MG PO TABS: 10.0000 mg | ORAL_TABLET | Freq: Every day | ORAL | Status: DC

## 2013-02-13 NOTE — Progress Notes (Signed)
Patient ID: Michelle Sosa, female   DOB: [birth year], 67 y.o.   MRN: [id number]  CC:  HPI: 67 year old female who is here for hypertension followup She states that she has been gaining some weight and has gained 20 pounds in the last couple of years. She had a stroke in 2008. She does not exercise. She checks her blood pressure at home and her systolic blood pressures have been running between 120 and 130. She denies any chest pain, shortness of breath, dizziness .  Allergies  Allergen Reactions  . Codeine   . Penicillins   . Sulfa Antibiotics    Past Medical History  Diagnosis Date  . Hypertension   . Stroke    No current outpatient prescriptions on file prior to visit.   No current facility-administered medications on file prior to visit.   History reviewed. No pertinent family history. History   Social History  . Marital Status: Single    Spouse Name: N/A    Number of Children: N/A  . Years of Education: N/A   Occupational History  . Not on file.   Social History Main Topics  . Smoking status: Former Games developer  . Smokeless tobacco: Not on file  . Alcohol Use: No  . Drug Use: Not on file  . Sexually Active: Not on file   Other Topics Concern  . Not on file   Social History Narrative  . No narrative on file    Review of Systems  Constitutional: Negative for fever, chills, diaphoresis, activity change, appetite change and fatigue.  HENT: Negative for ear pain, nosebleeds, congestion, facial swelling, rhinorrhea, neck pain, neck stiffness and ear discharge.   Eyes: Negative for pain, discharge, redness, itching and visual disturbance.  Respiratory: Negative for cough, choking, chest tightness, shortness of breath, wheezing and stridor.   Cardiovascular: Negative for chest pain, palpitations and leg swelling.  Gastrointestinal: Negative for abdominal distention.  Genitourinary: Negative for dysuria, urgency, frequency, hematuria, flank pain, decreased urine  volume, difficulty urinating and dyspareunia.  Musculoskeletal: Negative for back pain, joint swelling, arthralgias and gait problem.  Neurological: Negative for dizziness, tremors, seizures, syncope, facial asymmetry, speech difficulty, weakness, light-headedness, numbness and headaches.  Hematological: Negative for adenopathy. Does not bruise/bleed easily.  Psychiatric/Behavioral: Negative for hallucinations, behavioral problems, confusion, dysphoric mood, decreased concentration and agitation.    Objective:   Filed Vitals:   02/13/13 0918  BP: 162/80  Pulse: 75  Temp: 98.2 F (36.8 C)  Resp: 15    Physical Exam  Constitutional: Appears well-developed and well-nourished. No distress.  HENT: Normocephalic. External right and left ear normal. Oropharynx is clear and moist.  Eyes: Conjunctivae and EOM are normal. PERRLA, no scleral icterus.  Neck: Normal ROM. Neck supple. No JVD. No tracheal deviation. No thyromegaly.  CVS: RRR, S1/S2 +, no murmurs, no gallops, no carotid bruit.  Pulmonary: Effort and breath sounds normal, no stridor, rhonchi, wheezes, rales.  Abdominal: Soft. BS +,  no distension, tenderness, rebound or guarding.  Musculoskeletal: Normal range of motion. No edema and no tenderness.  Lymphadenopathy: No lymphadenopathy noted, cervical, inguinal. Neuro: Alert. Normal reflexes, muscle tone coordination. No cranial nerve deficit. Skin: Skin is warm and dry. No rash noted. Not diaphoretic. No erythema. No pallor.  Psychiatric: Normal mood and affect. Behavior, judgment, thought content normal.   Lab Results  Component Value Date   WBC 8.1 12/20/2012   HGB 13.5 12/20/2012   HCT 39.0 12/20/2012   MCV 92.4 12/20/2012   PLT 240 12/20/2012  Lab Results  Component Value Date   CREATININE 0.46* 10/25/2012   BUN 12 10/25/2012   NA 134* 10/25/2012   K 4.3 10/25/2012   CL 99 10/25/2012   CO2 24 10/25/2012    Lab Results  Component Value Date   HGBA1C 5.4 01/18/2010   Lipid  Panel     Component Value Date/Time   CHOL 191 12/20/2012 1028   TRIG 92 12/20/2012 1028   HDL 68 12/20/2012 1028   CHOLHDL 2.8 12/20/2012 1028   VLDL 18 12/20/2012 1028   LDLCALC 105* 12/20/2012 1028       Assessment and plan:   There are no active problems to display for this patient.  Hypertension Continue current dose of the same medications Patient states that her blood pressure is quite well controlled at home Check her kidney function today, ordered a BMP  Fatigue TSH mildly elevated We'll check a free T4, free T3 Patient instructed to call in a couple of days to get the results If a free T3 and T4 are normal, no further workup is indicated If these are low than she would need to start on Synthroid

## 2013-02-13 NOTE — Progress Notes (Signed)
Patient here for follow up for her blood pressure Would like results of lab work

## 2013-02-18 ENCOUNTER — Ambulatory Visit: Payer: Medicare Other

## 2013-04-16 ENCOUNTER — Ambulatory Visit: Payer: Medicare Other | Attending: Internal Medicine | Admitting: Internal Medicine

## 2013-04-16 ENCOUNTER — Encounter: Payer: Self-pay | Admitting: Internal Medicine

## 2013-04-16 VITALS — BP 138/80 | HR 72 | Temp 98.3°F | Resp 16 | Ht 64.17 in | Wt 158.0 lb

## 2013-04-16 DIAGNOSIS — I10 Essential (primary) hypertension: Secondary | ICD-10-CM | POA: Insufficient documentation

## 2013-04-16 MED ORDER — IRBESARTAN 300 MG PO TABS: 300.0000 mg | ORAL_TABLET | Freq: Every day | ORAL | Status: DC

## 2013-04-16 MED ORDER — HYDROCHLOROTHIAZIDE 12.5 MG PO TABS: 12.5000 mg | ORAL_TABLET | Freq: Every day | ORAL | Status: DC

## 2013-04-16 MED ORDER — AMLODIPINE BESYLATE 10 MG PO TABS: 10.0000 mg | ORAL_TABLET | Freq: Every day | ORAL | Status: DC

## 2013-04-16 MED ORDER — POTASSIUM CHLORIDE CRYS ER 20 MEQ PO TBCR: 20.0000 meq | EXTENDED_RELEASE_TABLET | Freq: Every day | ORAL | Status: DC

## 2013-04-16 NOTE — Progress Notes (Signed)
Patient ID: Michelle Sosa, female   DOB: July 07, 1946, 67 y.o.   MRN: [id number]  CC: Followup visit  HPI: Patient is in the clinic today for followup visit. She has no significant complaint except for the weight gain which has been ongoing since she stopped smoking. She claims she has gained about 20 pounds and she is worried where the weight gain is coming from. Thyroid function was checked and is within normal event noted TSH was slightly elevated. She has no chest pain no headache no abdominal pain, no shortness of breath. She thinks she may be retaining fluid and remembers she had done than before and she was placed on water pill. She wants to try the water pill again.  Allergies  Allergen Reactions  . Codeine   . Penicillins   . Sulfa Antibiotics    Past Medical History  Diagnosis Date  . Hypertension   . Stroke    Current Outpatient Prescriptions on File Prior to Visit  Medication Sig Dispense Refill  . aspirin 81 MG EC tablet Take 1 tablet (81 mg total) by mouth daily. Swallow whole.  30 tablet  12   No current facility-administered medications on file prior to visit.   No family history on file. History   Social History  . Marital Status: Single    Spouse Name: N/A    Number of Children: N/A  . Years of Education: N/A   Occupational History  . Not on file.   Social History Main Topics  . Smoking status: Former Smoker    Types: Cigarettes    Quit date: 02/14/2011  . Smokeless tobacco: Not on file  . Alcohol Use: No  . Drug Use: Not on file  . Sexual Activity: Not on file   Other Topics Concern  . Not on file   Social History Narrative  . No narrative on file    Review of Systems: Constitutional: Negative for fever, chills, diaphoresis, activity change, appetite change and fatigue. HENT: Negative for ear pain, nosebleeds, congestion, facial swelling, rhinorrhea, neck pain, neck stiffness and ear discharge.  Eyes: Negative for pain, discharge, redness,  itching and visual disturbance. Respiratory: Negative for cough, choking, chest tightness, shortness of breath, wheezing and stridor.  Cardiovascular: Negative for chest pain, palpitations and leg swelling. Gastrointestinal: Negative for abdominal distention. Genitourinary: Negative for dysuria, urgency, frequency, hematuria, flank pain, decreased urine volume, difficulty urinating and dyspareunia.  Musculoskeletal: Negative for back pain, joint swelling, arthralgias and gait problem. Neurological: Negative for dizziness, tremors, seizures, syncope, facial asymmetry, speech difficulty, weakness, light-headedness, numbness and headaches.  Hematological: Negative for adenopathy. Does not bruise/bleed easily. Psychiatric/Behavioral: Negative for hallucinations, behavioral problems, confusion, dysphoric mood, decreased concentration and agitation.    Objective:   Filed Vitals:   04/16/13 1550  BP: 138/80  Pulse: 72  Temp: 98.3 F (36.8 C)  Resp: 16    Physical Exam: Constitutional: Patient appears well-developed and well-nourished. No distress. HENT: Normocephalic, atraumatic, External right and left ear normal. Oropharynx is clear and moist.  Eyes: Conjunctivae and EOM are normal. PERRLA, no scleral icterus. Neck: Normal ROM. Neck supple. No JVD. No tracheal deviation. No thyromegaly. CVS: RRR, S1/S2 +, no murmurs, no gallops, no carotid bruit.  Pulmonary: Effort and breath sounds normal, no stridor, rhonchi, wheezes, rales.  Abdominal: Soft. BS +,  no distension, tenderness, rebound or guarding.  Musculoskeletal: Normal range of motion. No edema and no tenderness.  Lymphadenopathy: No lymphadenopathy noted, cervical, inguinal or axillary Neuro: Alert. Normal reflexes,  muscle tone coordination. No cranial nerve deficit. Skin: Skin is warm and dry. No rash noted. Not diaphoretic. No erythema. No pallor. Psychiatric: Normal mood and affect. Behavior, judgment, thought content  normal.  Lab Results  Component Value Date   WBC 8.1 12/20/2012   HGB 13.5 12/20/2012   HCT 39.0 12/20/2012   MCV 92.4 12/20/2012   PLT 240 12/20/2012   Lab Results  Component Value Date   CREATININE 0.64 02/13/2013   BUN 13 02/13/2013   NA 137 02/13/2013   K 4.2 02/13/2013   CL 102 02/13/2013   CO2 23 02/13/2013    Lab Results  Component Value Date   HGBA1C 5.4 01/18/2010   Lipid Panel     Component Value Date/Time   CHOL 191 12/20/2012 1028   TRIG 92 12/20/2012 1028   HDL 68 12/20/2012 1028   CHOLHDL 2.8 12/20/2012 1028   VLDL 18 12/20/2012 1028   LDLCALC 105* 12/20/2012 1028       Assessment and plan:   Patient Active Problem List   Diagnosis Date Noted  . Essential hypertension, benign 04/16/2013   Refill the medication: Amlodipine 10 mg tablet. By mouth daily Irbesartan 300 mg tablet by mouth daily at night Potassium tablet 20 mEq by mouth daily Add hydrochlorothiazide 12.5 mg tablet by mouth daily Aspirin 81 mg tablet by mouth daily  Patient extensively counseled to continue smoking cessation Exercise and nutrition counseling done in the clinic Return to clinic in 3 months for follow-up  Michelle Sosa was given clear instructions to go to ER or return to the clinic if symptoms don't improve, worsen or new problems develop.  Michelle Sosa verbalized understanding.  Michelle Sosa was told to call to get lab results if hasn't heard anything in the next week.    Jeanann Lewandowsky, MD [facility] [place], [place] [phone]   04/16/2013, 4:10 PM

## 2013-04-16 NOTE — Progress Notes (Signed)
Pt is here today for a follow up visit. She is here today to go over her lab results. She has questions about her recent wait gain.

## 2013-06-20 DIAGNOSIS — Z23 Encounter for immunization: Secondary | ICD-10-CM | POA: Diagnosis not present

## 2013-07-16 ENCOUNTER — Ambulatory Visit: Payer: Medicare Other | Attending: Internal Medicine | Admitting: Internal Medicine

## 2013-07-16 ENCOUNTER — Encounter: Payer: Self-pay | Admitting: Internal Medicine

## 2013-07-16 VITALS — BP 133/79 | HR 75 | Temp 98.2°F | Resp 16 | Ht 63.0 in | Wt 159.0 lb

## 2013-07-16 DIAGNOSIS — I10 Essential (primary) hypertension: Secondary | ICD-10-CM | POA: Diagnosis not present

## 2013-07-16 DIAGNOSIS — E039 Hypothyroidism, unspecified: Secondary | ICD-10-CM

## 2013-07-16 LAB — CMP AND LIVER
AST: 28 U/L (ref 0–37)
Albumin: 4.3 g/dL (ref 3.5–5.2)
Alkaline Phosphatase: 78 U/L (ref 39–117)
BUN: 14 mg/dL (ref 6–23)
Potassium: 4.7 mEq/L (ref 3.5–5.3)

## 2013-07-16 LAB — LIPID PANEL
HDL: 53 mg/dL (ref >39)
LDL Cholesterol: 107 mg/dL — ABNORMAL HIGH (ref 0–99)
Total CHOL/HDL Ratio: 3.5 Ratio
VLDL: 27 mg/dL (ref 0–40)

## 2013-07-16 NOTE — Patient Instructions (Signed)
Hypertension  As your heart beats, it forces blood through your arteries. This force is your blood pressure. If the pressure is too high, it is called hypertension (HTN) or high blood pressure. HTN is dangerous because you may have it and not know it. High blood pressure may mean that your heart has to work harder to pump blood. Your arteries may be narrow or stiff. The extra work puts you at risk for heart disease, stroke, and other problems.   Blood pressure consists of two numbers, a higher number over a lower, 110/72, for example. It is stated as "110 over 72." The ideal is below 120 for the top number (systolic) and under 80 for the bottom (diastolic). Write down your blood pressure today.  You should pay close attention to your blood pressure if you have certain conditions such as:   Heart failure.   Prior heart attack.   Diabetes   Chronic kidney disease.   Prior stroke.   Multiple risk factors for heart disease.  To see if you have HTN, your blood pressure should be measured while you are seated with your arm held at the level of the heart. It should be measured at least twice. A one-time elevated blood pressure reading (especially in the Emergency Department) does not mean that you need treatment. There may be conditions in which the blood pressure is different between your right and left arms. It is important to see your caregiver soon for a recheck.  Most people have essential hypertension which means that there is not a specific cause. This type of high blood pressure may be lowered by changing lifestyle factors such as:   Stress.   Smoking.   Lack of exercise.   Excessive weight.   Drug/tobacco/alcohol use.   Eating less salt.  Most people do not have symptoms from high blood pressure until it has caused damage to the body. Effective treatment can often prevent, delay or reduce that damage.  TREATMENT    When a cause has been identified, treatment for high blood pressure is directed at the cause. There are a large number of medications to treat HTN. These fall into several categories, and your caregiver will help you select the medicines that are best for you. Medications may have side effects. You should review side effects with your caregiver.  If your blood pressure stays high after you have made lifestyle changes or started on medicines,    Your medication(s) may need to be changed.   Other problems may need to be addressed.   Be certain you understand your prescriptions, and know how and when to take your medicine.   Be sure to follow up with your caregiver within the time frame advised (usually within two weeks) to have your blood pressure rechecked and to review your medications.   If you are taking more than one medicine to lower your blood pressure, make sure you know how and at what times they should be taken. Taking two medicines at the same time can result in blood pressure that is too low.  SEEK IMMEDIATE MEDICAL CARE IF:   You develop a severe headache, blurred or changing vision, or confusion.   You have unusual weakness or numbness, or a faint feeling.   You have severe chest or abdominal pain, vomiting, or breathing problems.  MAKE SURE YOU:    Understand these instructions.   Will watch your condition.   Will get help right away if you are not doing well   or get worse.  Document Released: 08/01/2005 Document Revised: 10/24/2011 Document Reviewed: 03/21/2008  ExitCare Patient Information 2014 [employer]  DASH Diet   The DASH diet stands for "Dietary Approaches to Stop Hypertension." It is a healthy eating plan that has been shown to reduce high blood pressure (hypertension) in as little as 14 days, while also possibly providing other significant health benefits. These other health benefits include reducing the risk of breast cancer after menopause and reducing the risk of type 2 diabetes, heart disease, colon cancer, and stroke. Health benefits also include weight loss and slowing kidney failure in patients with chronic kidney disease.   DIET GUIDELINES   Limit salt (sodium). Your diet should contain less than 1500 mg of sodium daily.   Limit refined or processed carbohydrates. Your diet should include mostly whole grains. Desserts and added sugars should be used sparingly.   Include small amounts of heart-healthy fats. These types of fats include nuts, oils, and tub margarine. Limit saturated and trans fats. These fats have been shown to be harmful in the body.  CHOOSING FOODS   The following food groups are based on a 2000 calorie diet. See your Registered Dietitian for individual calorie needs.  Grains and Grain Products (6 to 8 servings daily)   Eat More Often: Whole-wheat bread, brown rice, whole-grain or wheat pasta, quinoa, popcorn without added fat or salt (air popped).   Eat Less Often: White bread, white pasta, white rice, cornbread.  Vegetables (4 to 5 servings daily)   Eat More Often: Fresh, frozen, and canned vegetables. Vegetables may be raw, steamed, roasted, or grilled with a minimal amount of fat.   Eat Less Often/Avoid: Creamed or fried vegetables. Vegetables in a cheese sauce.  Fruit (4 to 5 servings daily)   Eat More Often: All fresh, canned (in natural juice), or frozen fruits. Dried fruits without added sugar. One hundred percent fruit juice ( cup [237 mL] daily).   Eat Less Often: Dried fruits with added sugar. Canned fruit in light or heavy syrup.   Lean Meats, Fish, and Poultry (2 servings or less daily. One serving is 3 to 4 oz [85-114 g]).   Eat More Often: Ninety percent or leaner ground beef, tenderloin, sirloin. Round cuts of beef, chicken breast, turkey breast. All fish. Grill, bake, or broil your meat. Nothing should be fried.   Eat Less Often/Avoid: Fatty cuts of meat, turkey, or chicken leg, thigh, or wing. Fried cuts of meat or fish.  Dairy (2 to 3 servings)   Eat More Often: Low-fat or fat-free milk, low-fat plain or light yogurt, reduced-fat or part-skim cheese.   Eat Less Often/Avoid: Milk (whole, 2%).Whole milk yogurt. Full-fat cheeses.  Nuts, Seeds, and Legumes (4 to 5 servings per week)   Eat More Often: All without added salt.   Eat Less Often/Avoid: Salted nuts and seeds, canned beans with added salt.  Fats and Sweets (limited)   Eat More Often: Vegetable oils, tub margarines without trans fats, sugar-free gelatin. Mayonnaise and salad dressings.   Eat Less Often/Avoid: Coconut oils, palm oils, butter, stick margarine, cream, half and half, cookies, candy, pie.  FOR MORE INFORMATION  The Dash Diet Eating Plan: www.dashdiet.org  Document Released: 07/21/2011 Document Revised: 10/24/2011 Document Reviewed: 07/21/2011  [employer] Patient Information 2014 [employer]

## 2013-07-16 NOTE — Progress Notes (Signed)
Pt is here following up on her HTN. 

## 2013-07-16 NOTE — Progress Notes (Signed)
Patient ID: Michelle Sosa, female   DOB: 05-22-1946, 67 y.o.   MRN: [id number] Patient Demographics  Michelle Sosa, is a 67 y.o. female  [id number]  [id number]  DOB - October 01, 1945  Chief Complaint  Patient presents with  . Follow-up        Subjective:   Michelle Sosa is a 67 y.o. pleasant woman here today for a follow up visit. She is known to have hypertension with remote history of stroke on amlodipine and hydrochlorothiazide, blood pressure is well-controlled. She has no complaint today. Recently got her pneumococcal vaccine as well as flu shot. She has enough refills of her medications Patient has No headache, No chest pain, No abdominal pain - No Nausea, No new weakness tingling or numbness, No Cough - SOB.  ALLERGIES: Allergies  Allergen Reactions  . Codeine   . Penicillins   . Sulfa Antibiotics     PAST MEDICAL HISTORY: Past Medical History  Diagnosis Date  . Hypertension   . Stroke     MEDICATIONS AT HOME: Prior to Admission medications   Medication Sig Start Date End Date Taking? Authorizing Provider  amLODipine (NORVASC) 10 MG tablet Take 1 tablet (10 mg total) by mouth daily. 04/16/13  Yes Jeanann Lewandowsky, MD  aspirin 81 MG EC tablet Take 1 tablet (81 mg total) by mouth daily. Swallow whole. 02/13/13  Yes Richarda Overlie, MD  hydrochlorothiazide (HYDRODIURIL) 12.5 MG tablet Take 1 tablet (12.5 mg total) by mouth daily. 04/16/13  Yes Jeanann Lewandowsky, MD  irbesartan (AVAPRO) 300 MG tablet Take 1 tablet (300 mg total) by mouth at bedtime. 04/16/13  Yes Jeanann Lewandowsky, MD  potassium chloride SA (K-DUR,KLOR-CON) 20 MEQ tablet Take 1 tablet (20 mEq total) by mouth daily. 04/16/13  Yes Jeanann Lewandowsky, MD  thiamine (VITAMIN B-1) 100 MG tablet Take 100 mg by mouth daily.   Yes Historical Provider, MD     Objective:   Filed Vitals:   07/16/13 0918  BP: 133/79  Pulse: 75  Temp: 98.2 F (36.8 C)  TempSrc: Oral  Resp: 16  Height: 5\' 3"  (1.6 m)   Weight: 159 lb (72.122 kg)  SpO2: 98%    Exam General appearance : Awake, alert, not in any distress. Speech Clear. Not toxic looking, poor dentition HEENT: Atraumatic and Normocephalic, pupils equally reactive to light and accomodation Neck: supple, no JVD. No cervical lymphadenopathy.  Chest:Good air entry bilaterally, no added sounds  CVS: S1 S2 regular, no murmurs.  Abdomen: Bowel sounds present, Non tender and not distended with no gaurding, rigidity or rebound. Extremities: B/L Lower Ext shows no edema, both legs are warm to touch Neurology: Awake alert, and oriented X 3, CN II-XII intact, Non focal Skin:No Rash Wounds:N/A   Data Review   CBC No results found for this basename: WBC, HGB, HCT, PLT, MCV, MCH, MCHC, RDW, NEUTRABS, LYMPHSABS, MONOABS, EOSABS, BASOSABS, BANDABS, BANDSABD,  in the last 168 hours  Chemistries   No results found for this basename: NA, K, CL, CO2, GLUCOSE, BUN, CREATININE, GFRCGP, CALCIUM, MG, AST, ALT, ALKPHOS, BILITOT,  in the last 168 hours ------------------------------------------------------------------------------------------------------------------ No results found for this basename: HGBA1C,  in the last 72 hours ------------------------------------------------------------------------------------------------------------------ No results found for this basename: CHOL, HDL, LDLCALC, TRIG, CHOLHDL, LDLDIRECT,  in the last 72 hours ------------------------------------------------------------------------------------------------------------------ No results found for this basename: TSH, T4TOTAL, FREET3, T3FREE, THYROIDAB,  in the last 72 hours ------------------------------------------------------------------------------------------------------------------ No results found for this basename: VITAMINB12, FOLATE, FERRITIN, TIBC, IRON, RETICCTPCT,  in the last 72 hours  Coagulation profile  No results found for this basename: INR, PROTIME,  in the  last 168 hours    Assessment & Plan   1. Essential hypertension, benign  Continue hydrochlorothiazide 12.5 mg tablet by mouth daily Continue amlodipine 10 mg tablet by mouth daily Continue irbesartan 300 mg tablet by mouth daily  - CMP and Liver - Urinalysis, Complete - Lipid panel  2. Unspecified hypothyroidism Previous results shows normal T3 and T4 with high TSH, asymptomatic - TSH - T4, free  Patient has been counseled extensively about nutrition and diet   Health Maintenance -Colonoscopy: Patient wants to defer -Pap Smear: Deferred by patient -Mammogram: Deferred by patient, saying too many things going on in the family at the moment -Vaccinations:  -Influenza already given  Follow up in 3 months or when necessary   The patient was given clear instructions to go to ER or return to medical center if symptoms don't improve, worsen or new problems develop. The patient verbalized understanding. The patient was told to call to get lab results if they haven't heard anything in the next week.    Jeanann Lewandowsky, MD, MHA, FACP, FAAP [facility] [place], [place] [phone]   07/16/2013, 9:49 AM

## 2013-07-17 ENCOUNTER — Telehealth: Payer: Self-pay | Admitting: Emergency Medicine

## 2013-07-17 LAB — URINALYSIS, COMPLETE
Bilirubin Urine: NEGATIVE
Casts: NONE SEEN
Crystals: NONE SEEN
Glucose, UA: NEGATIVE mg/dL
Hgb urine dipstick: NEGATIVE
Ketones, ur: NEGATIVE mg/dL
Nitrite: NEGATIVE
Protein, ur: NEGATIVE mg/dL
Specific Gravity, Urine: 1.008 (ref 1.005–1.030)
Squamous Epithelial / HPF: NONE SEEN
Urobilinogen, UA: 0.2 mg/dL (ref 0.0–1.0)
pH: 6.5 (ref 5.0–8.0)

## 2013-07-17 LAB — T4, FREE: Free T4: 1.14 ng/dL (ref 0.80–1.80)

## 2013-07-17 LAB — TSH: TSH: 3.876 u[IU]/mL (ref 0.350–4.500)

## 2013-07-17 NOTE — Telephone Encounter (Signed)
Pt given normal lab results 

## 2013-07-17 NOTE — Telephone Encounter (Signed)
Message copied by Darlis Loan on Wed Jul 17, 2013  5:38 PM ------      Message from: Jeanann Lewandowsky E      Created: Wed Jul 17, 2013  4:44 PM       Please inform patient that the results came back normal. Her thyroid function is within normal as well as her cholesterol level and urine test ------

## 2013-10-11 ENCOUNTER — Telehealth: Payer: Self-pay | Admitting: Surgery

## 2013-10-11 DIAGNOSIS — I10 Essential (primary) hypertension: Secondary | ICD-10-CM

## 2013-10-11 MED ORDER — POTASSIUM CHLORIDE CRYS ER 20 MEQ PO TBCR: 20.0000 meq | EXTENDED_RELEASE_TABLET | Freq: Every day | ORAL | Status: DC

## 2013-10-11 MED ORDER — AMLODIPINE BESYLATE 10 MG PO TABS: 10.0000 mg | ORAL_TABLET | Freq: Every day | ORAL | Status: DC

## 2013-10-11 MED ORDER — HYDROCHLOROTHIAZIDE 12.5 MG PO TABS: 12.5000 mg | ORAL_TABLET | Freq: Every day | ORAL | Status: DC

## 2013-10-11 MED ORDER — IRBESARTAN 300 MG PO TABS: 300.0000 mg | ORAL_TABLET | Freq: Every day | ORAL | Status: DC

## 2013-10-11 NOTE — Telephone Encounter (Signed)
Contacted patient regarding B/P check appointment. Rescheduled with Courtney CPP 3/4 at 945. Medication refills ordered and sent to [facility] on [address] at patient's request. Appt confirmed.

## 2013-10-14 ENCOUNTER — Ambulatory Visit: Payer: Medicare Other | Admitting: Internal Medicine

## 2013-10-16 ENCOUNTER — Encounter: Payer: Self-pay | Admitting: Pharmacist

## 2013-10-16 ENCOUNTER — Ambulatory Visit: Payer: Medicare Other | Attending: Internal Medicine | Admitting: Pharmacist

## 2013-10-16 VITALS — BP 115/68 | HR 72 | Temp 98.1°F | Ht 63.0 in | Wt 152.6 lb

## 2013-10-16 DIAGNOSIS — I10 Essential (primary) hypertension: Secondary | ICD-10-CM

## 2013-10-16 NOTE — Patient Instructions (Signed)
DASH Diet  The DASH diet stands for "Dietary Approaches to Stop Hypertension." It is a healthy eating plan that has been shown to reduce high blood pressure (hypertension) in as little as 14 days, while also possibly providing other significant health benefits. These other health benefits include reducing the risk of breast cancer after menopause and reducing the risk of type 2 diabetes, heart disease, colon cancer, and stroke. Health benefits also include weight loss and slowing kidney failure in patients with chronic kidney disease.   DIET GUIDELINES  · Limit salt (sodium). Your diet should contain less than 1500 mg of sodium daily.  · Limit refined or processed carbohydrates. Your diet should include mostly whole grains. Desserts and added sugars should be used sparingly.  · Include small amounts of heart-healthy fats. These types of fats include nuts, oils, and tub margarine. Limit saturated and trans fats. These fats have been shown to be harmful in the body.  CHOOSING FOODS   The following food groups are based on a 2000 calorie diet. See your Registered Dietitian for individual calorie needs.  Grains and Grain Products (6 to 8 servings daily)  · Eat More Often: Whole-wheat bread, brown rice, whole-grain or wheat pasta, quinoa, popcorn without added fat or salt (air popped).  · Eat Less Often: White bread, white pasta, white rice, cornbread.  Vegetables (4 to 5 servings daily)  · Eat More Often: Fresh, frozen, and canned vegetables. Vegetables may be raw, steamed, roasted, or grilled with a minimal amount of fat.  · Eat Less Often/Avoid: Creamed or fried vegetables. Vegetables in a cheese sauce.  Fruit (4 to 5 servings daily)  · Eat More Often: All fresh, canned (in natural juice), or frozen fruits. Dried fruits without added sugar. One hundred percent fruit juice (½ cup [237 mL] daily).  · Eat Less Often: Dried fruits with added sugar. Canned fruit in light or heavy syrup.  Lean Meats, Fish, and Poultry (2  servings or less daily. One serving is 3 to 4 oz [85-114 g]).  · Eat More Often: Ninety percent or leaner ground beef, tenderloin, sirloin. Round cuts of beef, chicken breast, turkey breast. All fish. Grill, bake, or broil your meat. Nothing should be fried.  · Eat Less Often/Avoid: Fatty cuts of meat, turkey, or chicken leg, thigh, or wing. Fried cuts of meat or fish.  Dairy (2 to 3 servings)  · Eat More Often: Low-fat or fat-free milk, low-fat plain or light yogurt, reduced-fat or part-skim cheese.  · Eat Less Often/Avoid: Milk (whole, 2%). Whole milk yogurt. Full-fat cheeses.  Nuts, Seeds, and Legumes (4 to 5 servings per week)  · Eat More Often: All without added salt.  · Eat Less Often/Avoid: Salted nuts and seeds, canned beans with added salt.  Fats and Sweets (limited)  · Eat More Often: Vegetable oils, tub margarines without trans fats, sugar-free gelatin. Mayonnaise and salad dressings.  · Eat Less Often/Avoid: Coconut oils, palm oils, butter, stick margarine, cream, half and half, cookies, candy, pie.  FOR MORE INFORMATION  The Dash Diet Eating Plan: www.dashdiet.org  Document Released: 07/21/2011 Document Revised: 10/24/2011 Document Reviewed: 07/21/2011  Exit[employer]® Patient Information ©2014 [employer].

## 2013-10-16 NOTE — Progress Notes (Signed)
S:    Patient arrives this morning to clinic for ambulatory blood pressure evaluation.  Diagnosed with Hypertension in the year of  2008.  Medication compliance is reported to taking everyday.  Current BP Medications include:  Amlodipine 10 mg, HCTZ 12. 5 mg, avapro 300 mg  Antihypertensives tried in the past include: n/a  Patient returned to the clinic and reported no issues at this time.  She checks blood pressure at home at least 3 times a week.  O:  Last 3 Office BP readings: 133/79 mmHg 138/80 mmHg 162/80 mmHg  Today's Office BP reading: 115/68 mmHg   BMET    Component Value Date/Time   NA 136 07/16/2013 0950   K 4.7 07/16/2013 0950   CL 98 07/16/2013 0950   CO2 28 07/16/2013 0950   GLUCOSE 87 07/16/2013 0950   BUN 14 07/16/2013 0950   CREATININE 0.61 07/16/2013 0950   CREATININE 0.46* 10/25/2012 1250   CALCIUM 10.0 07/16/2013 0950   GFRNONAA >90 10/25/2012 1250   GFRAA >90 10/25/2012 1250    A/P: There are no changes at this time.  BP is at goal this visit.  Pt is candidate for pneumo vaccine. Also, pt had a stroke in 2008 and she is not currently on statin therapy. Continue to take BP at home.  Follow up in 3 months with PCP.      

## 2013-10-16 NOTE — Assessment & Plan Note (Deleted)
S:    Patient arrives this morning to clinic for ambulatory blood pressure evaluation.  Diagnosed with Hypertension in the year of  2008.  Medication compliance is reported to taking everyday.  Current BP Medications include:  Amlodipine 10 mg, HCTZ 12. 5 mg, avapro 300 mg  Antihypertensives tried in the past include: n/a  Patient returned to the clinic and reported no issues at this time.  She checks blood pressure at home at least 3 times a week.  O:  Last 3 Office BP readings: 133/79 mmHg 138/80 mmHg 162/80 mmHg  Today's Office BP reading: 115/68 mmHg   BMET    Component Value Date/Time   NA 136 07/16/2013 0950   K 4.7 07/16/2013 0950   CL 98 07/16/2013 0950   CO2 28 07/16/2013 0950   GLUCOSE 87 07/16/2013 0950   BUN 14 07/16/2013 0950   CREATININE 0.61 07/16/2013 0950   CREATININE 0.46* 10/25/2012 1250   CALCIUM 10.0 07/16/2013 0950   GFRNONAA >90 10/25/2012 1250   GFRAA >90 10/25/2012 1250    A/P: There are no changes at this time.  BP is at goal this visit.  Pt is candidate for pneumo vaccine. Also, pt had a stroke in 2008 and she is not currently on statin therapy. Continue to take BP at home.  Follow up in 3 months with PCP.

## 2014-01-13 ENCOUNTER — Encounter: Payer: Self-pay | Admitting: Internal Medicine

## 2014-01-13 ENCOUNTER — Ambulatory Visit: Payer: Medicare Other | Attending: Internal Medicine | Admitting: Internal Medicine

## 2014-01-13 VITALS — BP 141/84 | HR 65 | Temp 98.4°F | Resp 16 | Ht 63.0 in | Wt 154.0 lb

## 2014-01-13 DIAGNOSIS — M7989 Other specified soft tissue disorders: Secondary | ICD-10-CM

## 2014-01-13 DIAGNOSIS — I10 Essential (primary) hypertension: Secondary | ICD-10-CM

## 2014-01-13 LAB — COMPLETE METABOLIC PANEL WITH GFR
ALBUMIN: 4.4 g/dL (ref 3.5–5.2)
ALT: 28 U/L (ref 0–35)
AST: 25 U/L (ref 0–37)
Alkaline Phosphatase: 64 U/L (ref 39–117)
BUN: 12 mg/dL (ref 6–23)
CALCIUM: 10.1 mg/dL (ref 8.4–10.5)
CHLORIDE: 93 meq/L — AB (ref 96–112)
CO2: 26 meq/L (ref 19–32)
CREATININE: 0.58 mg/dL (ref 0.50–1.10)
GLUCOSE: 87 mg/dL (ref 70–99)
POTASSIUM: 4.7 meq/L (ref 3.5–5.3)
Sodium: 127 mEq/L — ABNORMAL LOW (ref 135–145)
Total Bilirubin: 0.5 mg/dL (ref 0.2–1.2)
Total Protein: 8 g/dL (ref 6.0–8.3)

## 2014-01-13 MED ORDER — IRBESARTAN 300 MG PO TABS
300.0000 mg | ORAL_TABLET | Freq: Every day | ORAL | Status: DC
Start: 2014-01-13 — End: 2014-04-14

## 2014-01-13 MED ORDER — HYDROCHLOROTHIAZIDE 12.5 MG PO TABS: 12.5000 mg | ORAL_TABLET | Freq: Every day | ORAL | Status: DC

## 2014-01-13 MED ORDER — DICLOFENAC SODIUM 1 % TD GEL: 2.0000 g | Freq: Two times a day (BID) | TRANSDERMAL | Status: DC

## 2014-01-13 MED ORDER — AMLODIPINE BESYLATE 10 MG PO TABS: 10.0000 mg | ORAL_TABLET | Freq: Every day | ORAL | Status: DC

## 2014-01-13 NOTE — Patient Instructions (Signed)
If continue to have back pain return to clinic DASH Diet [employer] stands for "Dietary Approaches to Stop Hypertension." It is a healthy eating plan that has been shown to reduce high blood pressure (hypertension) in as little as 14 days, while also possibly providing other significant health benefits. These other health benefits include reducing the risk of breast cancer after menopause and reducing the risk of type 2 diabetes, heart disease, colon cancer, and stroke. Health benefits also include weight loss and slowing kidney failure in patients with chronic kidney disease.  DIET GUIDELINES  Limit salt (sodium). Your diet should contain less than 1500 mg of sodium daily.  Limit refined or processed carbohydrates. Your diet should include mostly whole grains. Desserts and added sugars should be used sparingly.  Include small amounts of heart-healthy fats. These types of fats include nuts, oils, and tub margarine. Limit saturated and trans fats. These fats have been shown to be harmful in the body. CHOOSING FOODS  The following food groups are based on a 2000 calorie diet. See your Registered Dietitian for individual calorie needs. Grains and Grain Products (6 to 8 servings daily)  Eat More Often: Whole-wheat bread, brown rice, whole-grain or wheat pasta, quinoa, popcorn without added fat or salt (air popped).  Eat Less Often: White bread, white pasta, white rice, cornbread. Vegetables (4 to 5 servings daily)  Eat More Often: Fresh, frozen, and canned vegetables. Vegetables may be raw, steamed, roasted, or grilled with a minimal amount of fat.  Eat Less Often/Avoid: Creamed or fried vegetables. Vegetables in a cheese sauce. Fruit (4 to 5 servings daily)  Eat More Often: All fresh, canned (in natural juice), or frozen fruits. Dried fruits without added sugar. One hundred percent fruit juice ( cup [237 mL] daily).  Eat Less Often: Dried fruits with added sugar. Canned fruit in light or  heavy syrup. [employer], Fish, and Poultry (2 servings or less daily. One serving is 3 to 4 oz [85-114 g]).  Eat More Often: Ninety percent or leaner ground beef, tenderloin, sirloin. Round cuts of beef, chicken breast, [place] breast. All fish. Grill, bake, or broil your meat. Nothing should be fried.  Eat Less Often/Avoid: Fatty cuts of meat, [place], or chicken leg, thigh, or wing. Fried cuts of meat or fish. Dairy (2 to 3 servings)  Eat More Often: Low-fat or fat-free milk, low-fat plain or light yogurt, reduced-fat or part-skim cheese.  Eat Less Often/Avoid: Milk (whole, 2%).Whole milk yogurt. Full-fat cheeses. Nuts, Seeds, and Legumes (4 to 5 servings per week)  Eat More Often: All without added salt.  Eat Less Often/Avoid: Salted nuts and seeds, canned beans with added salt. Fats and Sweets (limited)  Eat More Often: Vegetable oils, tub margarines without trans fats, sugar-free gelatin. Mayonnaise and salad dressings.  Eat Less Often/Avoid: Coconut oils, palm oils, butter, stick margarine, cream, half and half, cookies, candy, pie. FOR MORE INFORMATION The Dash Diet Eating Plan: www.dashdiet.org Document Released: 07/21/2011 Document Revised: 10/24/2011 Document Reviewed: 07/21/2011 [facility] Patient Information 2014 [place], [place]. Back Injury Prevention Back injuries can be extremely painful and difficult to heal. After having one back injury, you are much more likely to experience another later on. It is important to learn how to avoid injuring or re-injuring your back. The following tips can help you to prevent a back injury. PHYSICAL FITNESS  Exercise regularly and try to develop good tone in your abdominal muscles. Your abdominal muscles provide a lot of the support needed by your  back.  Do aerobic exercises (walking, jogging, biking, swimming) regularly.  Do exercises that increase balance and strength (tai chi, yoga) regularly. This can decrease your risk of falling and  injuring your back.  Stretch before and after exercising.  Maintain a healthy weight. The more you weigh, the more stress is placed on your back. For every pound of weight, 10 times that amount of pressure is placed on the back. DIET  Talk to your caregiver about how much calcium and vitamin D you need per day. These nutrients help to prevent weakening of the bones (osteoporosis). Osteoporosis can cause broken (fractured) bones that lead to back pain.  Include good sources of calcium in your diet, such as dairy products, green, leafy vegetables, and products with calcium added (fortified).  Include good sources of vitamin D in your diet, such as milk and foods that are fortified with vitamin D.  Consider taking a nutritional supplement or a multivitamin if needed.  Stop smoking if you smoke. POSTURE  Sit and stand up straight. Avoid leaning forward when you sit or hunching over when you stand.  Choose chairs with good low back (lumbar) support.  If you work at a desk, sit close to your work so you do not need to lean over. Keep your chin tucked in. Keep your neck drawn back and elbows bent at a right angle. Your arms should look like the letter "L."  Sit high and close to the steering wheel when you drive. Add a lumbar support to your car seat if needed.  Avoid sitting or standing in one position for too long. Take breaks to get up, stretch, and walk around at least once every hour. Take breaks if you are driving for long periods of time.  Sleep on your side with your knees slightly bent, or sleep on your back with a pillow under your knees. Do not sleep on your stomach. LIFTING, TWISTING, AND REACHING  Avoid heavy lifting, especially repetitive lifting. If you must do heavy lifting:  Stretch before lifting.  Work slowly.  Rest between lifts.  Use carts and dollies to move objects when possible.  Make several small trips instead of carrying 1 heavy load.  Ask for help when  you need it.  Ask for help when moving big, awkward objects.  Follow these steps when lifting:  Stand with your feet shoulder-width apart.  Get as close to the object as you can. Do not try to pick up heavy objects that are far from your body.  Use handles or lifting straps if they are available.  Bend at your knees. Squat down, but keep your heels off the floor.  Keep your shoulders pulled back, your chin tucked in, and your back straight.  Lift the object slowly, tightening the muscles in your legs, abdomen, and buttocks. Keep the object as close to the center of your body as possible.  When you put a load down, use these same guidelines in reverse.  Do not:  Lift the object above your waist.  Twist at the waist while lifting or carrying a load. Move your feet if you need to turn, not your waist.  Bend over without bending at your knees.  Avoid reaching over your head, across a table, or for an object on a high surface. OTHER TIPS  Avoid wet floors and keep sidewalks clear of ice to prevent falls.  Do not sleep on a mattress that is too soft or too hard.  Keep items  that are used frequently within easy reach.  Put heavier objects on shelves at waist level and lighter objects on lower or higher shelves.  Find ways to decrease your stress, such as exercise, massage, or relaxation techniques. Stress can build up in your muscles. Tense muscles are more vulnerable to injury.  Seek treatment for depression or anxiety if needed. These conditions can increase your risk of developing back pain. SEEK MEDICAL CARE IF:  You injure your back.  You have questions about diet, exercise, or other ways to prevent back injuries. MAKE SURE YOU:  Understand these instructions.  Will watch your condition.  Will get help right away if you are not doing well or get worse. Document Released: 09/08/2004 Document Revised: 10/24/2011 Document Reviewed: 09/12/2011 [facility] Patient  Information 2014 [place], [place].

## 2014-01-13 NOTE — Progress Notes (Signed)
Patient ID: Michelle Sosa, female   DOB: 1945/11/13, 68 y.o.   MRN: [id number]  CC: f/u HTN, back pain  HPI: Patient reports that she has been checking her BP at home and they have been around 120's over 70's.  Patient reports that she is complaint with medication.  Reports that she has been having pain in her thoracic spine area for one week.  She denies any injuries or falls.  Reports that she did lift a heavy watermelon around that time that may have caused her pain.  She reports achy pain and has not tried anything for pain. She reports some improvement in pain today.    Allergies  Allergen Reactions  . Codeine   . Penicillins   . Sulfa Antibiotics    Past Medical History  Diagnosis Date  . Hypertension   . Stroke    Current Outpatient Prescriptions on File Prior to Visit  Medication Sig Dispense Refill  . amLODipine (NORVASC) 10 MG tablet Take 1 tablet (10 mg total) by mouth daily.  30 tablet  3  . aspirin 81 MG EC tablet Take 1 tablet (81 mg total) by mouth daily. Swallow whole.  30 tablet  12  . irbesartan (AVAPRO) 300 MG tablet Take 1 tablet (300 mg total) by mouth at bedtime.  30 tablet  3  . multivitamin-iron-minerals-folic acid (CENTRUM) chewable tablet Chew 1 tablet by mouth daily.      . potassium chloride SA (K-DUR,KLOR-CON) 20 MEQ tablet Take 1 tablet (20 mEq total) by mouth daily.  30 tablet  3  . thiamine (VITAMIN B-1) 100 MG tablet Take 100 mg by mouth daily.      . vitamin B-12 (CYANOCOBALAMIN) 100 MCG tablet Take 100 mcg by mouth daily.      . hydrochlorothiazide (HYDRODIURIL) 12.5 MG tablet Take 1 tablet (12.5 mg total) by mouth daily.  90 tablet  3   No current facility-administered medications on file prior to visit.   History reviewed. No pertinent family history. History   Social History  . Marital Status: Single    Spouse Name: N/A    Number of Children: N/A  . Years of Education: N/A   Occupational History  . Not on file.   Social History Main  Topics  . Smoking status: Former Smoker    Types: Cigarettes    Quit date: 02/14/2011  . Smokeless tobacco: Not on file  . Alcohol Use: No  . Drug Use: Not on file  . Sexual Activity: Not on file   Other Topics Concern  . Not on file   Social History Narrative  . No narrative on file    Review of Systems  Eyes: Negative.   Respiratory: Negative.   Cardiovascular: Negative.   Musculoskeletal: Positive for back pain. Negative for falls.  Neurological: Negative for dizziness, tingling, sensory change and headaches.       Reports equilibrium is off due to hemiparesis from past stroke in 2008      Objective:   Filed Vitals:   01/13/14 1052  BP: 141/84  Pulse: 65  Temp: 98.4 F (36.9 C)  Resp: 16   Physical Exam  Vitals reviewed. Constitutional: She is oriented to person, place, and time.  Neck: Normal range of motion. Neck supple.  Cardiovascular: Normal rate, regular rhythm and normal heart sounds.   Pulmonary/Chest: Effort normal and breath sounds normal.  Abdominal: Soft. Bowel sounds are normal. She exhibits no distension. There is no tenderness.  Musculoskeletal: Normal range  of motion. She exhibits tenderness.  Has muscle swelling in left perivertebral area. Tender to touch  Neurological: She is alert and oriented to person, place, and time. She has normal reflexes.       Lab Results  Component Value Date   WBC 8.1 12/20/2012   HGB 13.5 12/20/2012   HCT 39.0 12/20/2012   MCV 92.4 12/20/2012   PLT 240 12/20/2012   Lab Results  Component Value Date   CREATININE 0.61 07/16/2013   BUN 14 07/16/2013   NA 136 07/16/2013   K 4.7 07/16/2013   CL 98 07/16/2013   CO2 28 07/16/2013    Lab Results  Component Value Date   HGBA1C 5.4 01/18/2010   Lipid Panel     Component Value Date/Time   CHOL 187 07/16/2013 0950   TRIG 135 07/16/2013 0950   HDL 53 07/16/2013 0950   CHOLHDL 3.5 07/16/2013 0950   VLDL 27 07/16/2013 0950   LDLCALC 107* 07/16/2013 0950       Assessment  and plan:   Michelle Sosa was seen today for follow-up and hypertension.  Diagnoses and associated orders for this visit:  Essential hypertension, benign - COMPLETE METABOLIC PANEL WITH GFR Continue current medication regimen - irbesartan (AVAPRO) 300 MG tablet; Take 1 tablet (300 mg total) by mouth at bedtime. - hydrochlorothiazide (HYDRODIURIL) 12.5 MG tablet; Take 1 tablet (12.5 mg total) by mouth daily. - amLODipine (NORVASC) 10 MG tablet; Take 1 tablet (10 mg total) by mouth daily. If potassium is low, will send refill to your pharmacy. Will not refill until lab results return.    Muscle swelling - diclofenac sodium (VOLTAREN) 1 % GEL; Apply 2 g topically 2 (two) times daily. May also try heat to area.    Return in about 3 months (around 04/15/2014) for with [facility] for HTN.       Lance Bosch, [facility] [phone] 01/14/2014, 1:38 PM

## 2014-01-13 NOTE — Progress Notes (Signed)
Pt comes in for blood pressure recheck with medication refills on Bp meds States pressure is reading higher than normal  BP 141/84 67 denies h/a,blurry vision or dizziness Denies sob, sats 96% r/a C/o upper left back strain

## 2014-01-14 ENCOUNTER — Encounter: Payer: Self-pay | Admitting: *Deleted

## 2014-01-14 ENCOUNTER — Telehealth: Payer: Self-pay | Admitting: *Deleted

## 2014-01-14 NOTE — Telephone Encounter (Signed)
Patient called to see if [facility] had contacted [employer] for Authorization. Informed patient fax has been received so we can start PA process. Patient verbalized agreement. Alverda Skeans, RN

## 2014-01-14 NOTE — Progress Notes (Signed)
PA completed  for Voltaren Gel. Will await approval and notify pharmacy of approval or denied. Alverda Skeans, RN

## 2014-04-14 ENCOUNTER — Encounter: Payer: Self-pay | Admitting: Internal Medicine

## 2014-04-14 ENCOUNTER — Ambulatory Visit: Payer: Medicare Other | Admitting: Internal Medicine

## 2014-04-14 ENCOUNTER — Ambulatory Visit: Payer: Medicare Other | Attending: Internal Medicine | Admitting: Internal Medicine

## 2014-04-14 VITALS — BP 122/75 | HR 67 | Temp 97.7°F | Resp 16 | Ht 63.0 in | Wt 152.1 lb

## 2014-04-14 DIAGNOSIS — C50919 Malignant neoplasm of unspecified site of unspecified female breast: Secondary | ICD-10-CM | POA: Diagnosis not present

## 2014-04-14 DIAGNOSIS — Z7982 Long term (current) use of aspirin: Secondary | ICD-10-CM | POA: Diagnosis not present

## 2014-04-14 DIAGNOSIS — Z853 Personal history of malignant neoplasm of breast: Secondary | ICD-10-CM | POA: Insufficient documentation

## 2014-04-14 DIAGNOSIS — Z87891 Personal history of nicotine dependence: Secondary | ICD-10-CM | POA: Diagnosis not present

## 2014-04-14 DIAGNOSIS — R5381 Other malaise: Secondary | ICD-10-CM | POA: Diagnosis not present

## 2014-04-14 DIAGNOSIS — I10 Essential (primary) hypertension: Secondary | ICD-10-CM | POA: Diagnosis not present

## 2014-04-14 DIAGNOSIS — R5383 Other fatigue: Secondary | ICD-10-CM

## 2014-04-14 DIAGNOSIS — Z9010 Acquired absence of unspecified breast and nipple: Secondary | ICD-10-CM | POA: Diagnosis not present

## 2014-04-14 DIAGNOSIS — I69998 Other sequelae following unspecified cerebrovascular disease: Secondary | ICD-10-CM | POA: Insufficient documentation

## 2014-04-14 DIAGNOSIS — C50911 Malignant neoplasm of unspecified site of right female breast: Secondary | ICD-10-CM

## 2014-04-14 MED ORDER — AMLODIPINE BESYLATE 10 MG PO TABS: 10.0000 mg | ORAL_TABLET | Freq: Every day | ORAL | Status: DC

## 2014-04-14 MED ORDER — IRBESARTAN 300 MG PO TABS: 300.0000 mg | ORAL_TABLET | Freq: Every day | ORAL | Status: DC

## 2014-04-14 MED ORDER — HYDROCHLOROTHIAZIDE 12.5 MG PO TABS: 12.5000 mg | ORAL_TABLET | Freq: Every day | ORAL | Status: DC

## 2014-04-14 NOTE — Progress Notes (Signed)
Patient ID: Nathanial Rancher, female   DOB: 03-Nov-1945, 68 y.o.   MRN: [id number]   Kirbie Stodghill, is a 68 y.o. female  [id number]  [id number]  DOB - August 17, 1945  Chief Complaint  Patient presents with  . Hypertension        Subjective:   Starsha Morning is a 68 y.o. female here today for a follow up visit. Patient has hypertension on amlodipine and hydrochlorothiazide, remote CVA with some residual weakness, here today for routine follow-up of hypertension. She has no new complaints. She is compliant with medications. She is also requesting for a prescription for breast prosthesis, status post right mastectomy for breast cancer. She quit smoking about 2 years ago. Patient has No headache, No chest pain, No abdominal pain - No Nausea, No new weakness tingling or numbness, No Cough - SOB.  Problem  Breast Cancer    ALLERGIES: Allergies  Allergen Reactions  . Codeine   . Penicillins   . Sulfa Antibiotics     PAST MEDICAL HISTORY: Past Medical History  Diagnosis Date  . Hypertension   . Stroke     MEDICATIONS AT HOME: Prior to Admission medications   Medication Sig Start Date End Date Taking? Authorizing Provider  amLODipine (NORVASC) 10 MG tablet Take 1 tablet (10 mg total) by mouth daily. 04/14/14  Yes Tresa Garter, MD  aspirin 81 MG EC tablet Take 1 tablet (81 mg total) by mouth daily. Swallow whole. 02/13/13  Yes Reyne Dumas, MD  hydrochlorothiazide (HYDRODIURIL) 12.5 MG tablet Take 1 tablet (12.5 mg total) by mouth daily. 04/14/14  Yes Tresa Garter, MD  irbesartan (AVAPRO) 300 MG tablet Take 1 tablet (300 mg total) by mouth at bedtime. 04/14/14  Yes Tresa Garter, MD  multivitamin-iron-minerals-folic acid (CENTRUM) chewable tablet Chew 1 tablet by mouth daily.   Yes Historical Provider, MD  thiamine (VITAMIN B-1) 100 MG tablet Take 100 mg by mouth daily.   Yes Historical Provider, MD  vitamin B-12 (CYANOCOBALAMIN) 100 MCG tablet Take 100 mcg  by mouth daily.   Yes Historical Provider, MD  diclofenac sodium (VOLTAREN) 1 % GEL Apply 2 g topically 2 (two) times daily. 01/13/14   Lance Bosch, NP  potassium chloride SA (K-DUR,KLOR-CON) 20 MEQ tablet Take 1 tablet (20 mEq total) by mouth daily. 10/11/13   Tresa Garter, MD     Objective:   Filed Vitals:   04/14/14 1237  BP: 122/75  Pulse: 67  Temp: 97.7 F (36.5 C)  TempSrc: Oral  Resp: 16  Height: 5\' 3"  (1.6 m)  Weight: 152 lb 2 oz (69.003 kg)  SpO2: 96%    Exam General appearance : Awake, alert, not in any distress. Speech Clear. Not toxic looking HEENT: Atraumatic and Normocephalic, pupils equally reactive to light and accomodation Neck: supple, no JVD. No cervical lymphadenopathy.  Chest: Status post right mastectomy Good air entry bilaterally, no added sounds  CVS: S1 S2 regular, no murmurs.  Abdomen: Bowel sounds present, Non tender and not distended with no gaurding, rigidity or rebound. Extremities: B/L Lower Ext shows no edema, both legs are warm to touch Neurology: Awake alert, and oriented X 3, CN II-XII intact, Non focal   Data Review Lab Results  Component Value Date   HGBA1C 5.4 01/18/2010   HGBA1C  Value: 5.3 (NOTE)   The ADA recommends the following therapeutic goals for glycemic   control related to Hgb A1C measurement:   Goal of Therapy:   < 7.0% Hgb  A1C   Action Suggested:  > 8.0% Hgb A1C   Ref:  Diabetes Care, 22, Suppl. 1, 1999 05/13/2007     Assessment & Plan   1. Essential hypertension, benign  - amLODipine (NORVASC) 10 MG tablet; Take 1 tablet (10 mg total) by mouth daily.  Dispense: 90 tablet; Refill: 3 - hydrochlorothiazide (HYDRODIURIL) 12.5 MG tablet; Take 1 tablet (12.5 mg total) by mouth daily.  Dispense: 90 tablet; Refill: 3 - irbesartan (AVAPRO) 300 MG tablet; Take 1 tablet (300 mg total) by mouth at bedtime.  Dispense: 90 tablet; Refill: 3  2. Breast cancer, right  Follow-up with oncologist for possible prescription of right  breast prosthesis   Patient was extensively counseled about nutrition and exercise  Return in about 6 months (around 10/13/2014), or if symptoms worsen or fail to improve, for Follow up HTN.  The patient was given clear instructions to go to ER or return to medical center if symptoms don't improve, worsen or new problems develop. The patient verbalized understanding. The patient was told to call to get lab results if they haven't heard anything in the next week.   This note has been created with Surveyor, quantity. Any transcriptional errors are unintentional.    Angelica Chessman, MD, [place], [place], [place] and [place], [place]   04/14/2014, 1:18 PM

## 2014-04-14 NOTE — Progress Notes (Signed)
Patient here for f/u on HTN  Patient need refills Patient would like to know if you can right a rx for a right breast prosthesis

## 2014-04-14 NOTE — Patient Instructions (Signed)
DASH Eating Plan DASH stands for "Dietary Approaches to Stop Hypertension." The DASH eating plan is a healthy eating plan that has been shown to reduce high blood pressure (hypertension). Additional health benefits may include reducing the risk of type 2 diabetes mellitus, heart disease, and stroke. The DASH eating plan may also help with weight loss. WHAT DO I NEED TO KNOW ABOUT THE DASH EATING PLAN? For the DASH eating plan, you will follow these general guidelines:  Choose foods with a percent daily value for sodium of less than 5% (as listed on the food label).  Use salt-free seasonings or herbs instead of table salt or sea salt.  Check with your health care provider or pharmacist before using salt substitutes.  Eat lower-sodium products, often labeled as "lower sodium" or "no salt added."  Eat fresh foods.  Eat more vegetables, fruits, and low-fat dairy products.  Choose whole grains. Look for the word "whole" as the first word in the ingredient list.  Choose fish and skinless chicken or turkey more often than red meat. Limit fish, poultry, and meat to 6 oz (170 g) each day.  Limit sweets, desserts, sugars, and sugary drinks.  Choose heart-healthy fats.  Limit cheese to 1 oz (28 g) per day.  Eat more home-cooked food and less restaurant, buffet, and fast food.  Limit fried foods.  Cook foods using methods other than frying.  Limit canned vegetables. If you do use them, rinse them well to decrease the sodium.  When eating at a restaurant, ask that your food be prepared with less salt, or no salt if possible. WHAT FOODS CAN I EAT? Seek help from a dietitian for individual calorie needs. Grains Whole grain or whole wheat bread. Brown rice. Whole grain or whole wheat pasta. Quinoa, bulgur, and whole grain cereals. Low-sodium cereals. Corn or whole wheat flour tortillas. Whole grain cornbread. Whole grain crackers. Low-sodium crackers. Vegetables Fresh or frozen vegetables  (raw, steamed, roasted, or grilled). Low-sodium or reduced-sodium tomato and vegetable juices. Low-sodium or reduced-sodium tomato sauce and paste. Low-sodium or reduced-sodium canned vegetables.  Fruits All fresh, canned (in natural juice), or frozen fruits. Meat and Other Protein Products Ground beef (85% or leaner), grass-fed beef, or beef trimmed of fat. Skinless chicken or turkey. Ground chicken or turkey. Pork trimmed of fat. All fish and seafood. Eggs. Dried beans, peas, or lentils. Unsalted nuts and seeds. Unsalted canned beans. Dairy Low-fat dairy products, such as skim or 1% milk, 2% or reduced-fat cheeses, low-fat ricotta or cottage cheese, or plain low-fat yogurt. Low-sodium or reduced-sodium cheeses. Fats and Oils Tub margarines without trans fats. Light or reduced-fat mayonnaise and salad dressings (reduced sodium). Avocado. Safflower, olive, or canola oils. Natural peanut or almond butter. Other Unsalted popcorn and pretzels. The items listed above may not be a complete list of recommended foods or beverages. Contact your dietitian for more options. WHAT FOODS ARE NOT RECOMMENDED? Grains White bread. White pasta. White rice. Refined cornbread. Bagels and croissants. Crackers that contain trans fat. Vegetables Creamed or fried vegetables. Vegetables in a cheese sauce. Regular canned vegetables. Regular canned tomato sauce and paste. Regular tomato and vegetable juices. Fruits Dried fruits. Canned fruit in light or heavy syrup. Fruit juice. Meat and Other Protein Products Fatty cuts of meat. Ribs, chicken wings, bacon, sausage, bologna, salami, chitterlings, fatback, hot dogs, bratwurst, and packaged luncheon meats. Salted nuts and seeds. Canned beans with salt. Dairy Whole or 2% milk, cream, half-and-half, and cream cheese. Whole-fat or sweetened yogurt. Full-fat   cheeses or blue cheese. Nondairy creamers and whipped toppings. Processed cheese, cheese spreads, or cheese  curds. Condiments Onion and garlic salt, seasoned salt, table salt, and sea salt. Canned and packaged gravies. Worcestershire sauce. Tartar sauce. Barbecue sauce. Teriyaki sauce. Soy sauce, including reduced sodium. Steak sauce. Fish sauce. Oyster sauce. Cocktail sauce. Horseradish. Ketchup and mustard. Meat flavorings and tenderizers. Bouillon cubes. Hot sauce. Tabasco sauce. Marinades. Taco seasonings. Relishes. Fats and Oils Butter, stick margarine, lard, shortening, ghee, and bacon fat. Coconut, palm kernel, or palm oils. Regular salad dressings. Other Pickles and olives. Salted popcorn and pretzels. The items listed above may not be a complete list of foods and beverages to avoid. Contact your dietitian for more information. WHERE CAN I FIND MORE INFORMATION? National Heart, Lung, and Blood Institute: www.nhlbi.nih.gov/health/health-topics/topics/dash/ Document Released: 07/21/2011 Document Revised: 12/16/2013 Document Reviewed: 06/05/2013 ExitCare Patient Information 2015 ExitCare[employer] LLC. This information is not intended to replace advice given to you by your health care provider. Make sure you discuss any questions you have with your health care provider. Hypertension Hypertension, commonly called high blood pressure, is when the force of blood pumping through your arteries is too strong. Your arteries are the blood vessels that carry blood from your heart throughout your body. A blood pressure reading consists of a higher number over a lower number, such as 110/72. The higher number (systolic) is the pressure inside your arteries when your heart pumps. The lower number (diastolic) is the pressure inside your arteries when your heart relaxes. Ideally you want your blood pressure below 120/80. Hypertension forces your heart to work harder to pump blood. Your arteries may become narrow or stiff. Having hypertension puts you at risk for heart disease, stroke, and other problems.  RISK  FACTORS Some risk factors for high blood pressure are controllable. Others are not.  Risk factors you cannot control include:   Race. You may be at higher risk if you are African American.  Age. Risk increases with age.  Gender. Men are at higher risk than women before age 45 years. After age 65, women are at higher risk than men. Risk factors you can control include:  Not getting enough exercise or physical activity.  Being overweight.  Getting too much fat, sugar, calories, or salt in your diet.  Drinking too much alcohol. SIGNS AND SYMPTOMS Hypertension does not usually cause signs or symptoms. Extremely high blood pressure (hypertensive crisis) may cause headache, anxiety, shortness of breath, and nosebleed. DIAGNOSIS  To check if you have hypertension, your health care provider will measure your blood pressure while you are seated, with your arm held at the level of your heart. It should be measured at least twice using the same arm. Certain conditions can cause a difference in blood pressure between your right and left arms. A blood pressure reading that is higher than normal on one occasion does not mean that you need treatment. If one blood pressure reading is high, ask your health care provider about having it checked again. TREATMENT  Treating high blood pressure includes making lifestyle changes and possibly taking medicine. Living a healthy lifestyle can help lower high blood pressure. You may need to change some of your habits. Lifestyle changes may include:  Following the DASH diet. This diet is high in fruits, vegetables, and whole grains. It is low in salt, red meat, and added sugars.  Getting at least 2 hours of brisk physical activity every week.  Losing weight if necessary.  Not smoking.  Limiting   alcoholic beverages.  Learning ways to reduce stress. If lifestyle changes are not enough to get your blood pressure under control, your health care provider may  prescribe medicine. You may need to take more than one. Work closely with your health care provider to understand the risks and benefits. HOME CARE INSTRUCTIONS  Have your blood pressure rechecked as directed by your health care provider.   Take medicines only as directed by your health care provider. Follow the directions carefully. Blood pressure medicines must be taken as prescribed. The medicine does not work as well when you skip doses. Skipping doses also puts you at risk for problems.   Do not smoke.   Monitor your blood pressure at home as directed by your health care provider. SEEK MEDICAL CARE IF:   You think you are having a reaction to medicines taken.  You have recurrent headaches or feel dizzy.  You have swelling in your ankles.  You have trouble with your vision. SEEK IMMEDIATE MEDICAL CARE IF:  You develop a severe headache or confusion.  You have unusual weakness, numbness, or feel faint.  You have severe chest or abdominal pain.  You vomit repeatedly.  You have trouble breathing. MAKE SURE YOU:   Understand these instructions.  Will watch your condition.  Will get help right away if you are not doing well or get worse. Document Released: 08/01/2005 Document Revised: 12/16/2013 Document Reviewed: 05/24/2013 ExitCare Patient Information 2015 ExitCare, LLC. This information is not intended to replace advice given to you by your health care provider. Make sure you discuss any questions you have with your health care provider.  

## 2014-04-15 LAB — COMPLETE METABOLIC PANEL WITH GFR
ALT: 29 U/L (ref 0–35)
AST: 26 U/L (ref 0–37)
Albumin: 4.4 g/dL (ref 3.5–5.2)
Alkaline Phosphatase: 70 U/L (ref 39–117)
BILIRUBIN TOTAL: 0.6 mg/dL (ref 0.2–1.2)
BUN: 14 mg/dL (ref 6–23)
CO2: 27 meq/L (ref 19–32)
Calcium: 9.9 mg/dL (ref 8.4–10.5)
Chloride: 98 mEq/L (ref 96–112)
Creat: 0.56 mg/dL (ref 0.50–1.10)
GFR, Est Non African American: >89 mL/min
Glucose, Bld: 91 mg/dL (ref 70–99)
Potassium: 4.4 mEq/L (ref 3.5–5.3)
SODIUM: 136 meq/L (ref 135–145)
TOTAL PROTEIN: 8 g/dL (ref 6.0–8.3)

## 2014-05-09 ENCOUNTER — Telehealth: Payer: Self-pay | Admitting: Emergency Medicine

## 2014-05-09 NOTE — Telephone Encounter (Signed)
Message copied by Ricci Barker on Fri May 09, 2014  3:44 PM ------      Message from: Angelica Chessman E      Created: Wed May 07, 2014 10:29 AM       Please inform patient that her comprehensive metabolic panel is normal ------

## 2014-05-09 NOTE — Telephone Encounter (Signed)
Pt given blood work results

## 2014-05-20 DIAGNOSIS — Z23 Encounter for immunization: Secondary | ICD-10-CM | POA: Diagnosis not present

## 2014-10-27 ENCOUNTER — Ambulatory Visit: Payer: Medicare Other | Admitting: Internal Medicine

## 2014-11-06 ENCOUNTER — Ambulatory Visit: Payer: Medicare Other | Attending: Internal Medicine | Admitting: Internal Medicine

## 2014-11-06 ENCOUNTER — Encounter: Payer: Self-pay | Admitting: Internal Medicine

## 2014-11-06 VITALS — BP 128/75 | HR 70 | Temp 98.2°F | Resp 16 | Ht 63.0 in | Wt 151.0 lb

## 2014-11-06 DIAGNOSIS — Z8673 Personal history of transient ischemic attack (TIA), and cerebral infarction without residual deficits: Secondary | ICD-10-CM | POA: Insufficient documentation

## 2014-11-06 DIAGNOSIS — I10 Essential (primary) hypertension: Secondary | ICD-10-CM | POA: Insufficient documentation

## 2014-11-06 LAB — CBC WITH DIFFERENTIAL/PLATELET
BASOS ABS: 0 10*3/uL (ref 0.0–0.1)
Basophils Relative: 0 % (ref 0–1)
EOS ABS: 0.1 10*3/uL (ref 0.0–0.7)
EOS PCT: 1 % (ref 0–5)
HCT: 40.2 % (ref 36.0–46.0)
Hemoglobin: 13.9 g/dL (ref 12.0–15.0)
Lymphocytes Relative: 27 % (ref 12–46)
Lymphs Abs: 2 10*3/uL (ref 0.7–4.0)
MCH: 32.1 pg (ref 26.0–34.0)
MCHC: 34.6 g/dL (ref 30.0–36.0)
MCV: 92.8 fL (ref 78.0–100.0)
MPV: 10.7 fL (ref 8.6–12.4)
Monocytes Absolute: 0.6 10*3/uL (ref 0.1–1.0)
Monocytes Relative: 8 % (ref 3–12)
Neutro Abs: 4.8 10*3/uL (ref 1.7–7.7)
Neutrophils Relative %: 64 % (ref 43–77)
PLATELETS: 307 10*3/uL (ref 150–400)
RBC: 4.33 MIL/uL (ref 3.87–5.11)
RDW: 12.8 % (ref 11.5–15.5)
WBC: 7.5 10*3/uL (ref 4.0–10.5)

## 2014-11-06 LAB — COMPLETE METABOLIC PANEL WITH GFR
ALT: 30 U/L (ref 0–35)
AST: 27 U/L (ref 0–37)
Albumin: 4.5 g/dL (ref 3.5–5.2)
Alkaline Phosphatase: 70 U/L (ref 39–117)
BUN: 16 mg/dL (ref 6–23)
CO2: 27 mEq/L (ref 19–32)
CREATININE: 0.47 mg/dL — AB (ref 0.50–1.10)
Calcium: 9.9 mg/dL (ref 8.4–10.5)
Chloride: 99 mEq/L (ref 96–112)
GFR, Est African American: >89 mL/min
GFR, Est Non African American: >89 mL/min
Glucose, Bld: 85 mg/dL (ref 70–99)
Potassium: 5.3 mEq/L (ref 3.5–5.3)
Sodium: 137 mEq/L (ref 135–145)
Total Bilirubin: 0.4 mg/dL (ref 0.2–1.2)
Total Protein: 8.1 g/dL (ref 6.0–8.3)

## 2014-11-06 LAB — LIPID PANEL
Cholesterol: 174 mg/dL (ref 0–200)
HDL: 48 mg/dL (ref >=46)
LDL Cholesterol: 111 mg/dL — ABNORMAL HIGH (ref 0–99)
Total CHOL/HDL Ratio: 3.6 Ratio
Triglycerides: 74 mg/dL (ref <150)
VLDL: 15 mg/dL (ref 0–40)

## 2014-11-06 NOTE — Progress Notes (Signed)
Patient ID: Michelle Sosa, female   DOB: [birth year], 69 y.o.   MRN: [id number]   Michelle Sosa, is a 69 y.o. female  [id number]  [id number]  DOB - [birth year]  Chief Complaint  Patient presents with  . Follow-up        Subjective:   Michelle Sosa is a 69 y.o. female here today for a follow up visit. Patient with history of hypertension and remote CVA came into clinic today for 6 months hypertension follow-up. She has no complaints today. She is compliant with medications, blood pressure has been controlled. She has no side effects. She has been going through domestic stress lately from her aged mother is at the point of death in a nursing home. She will prefer to defer her mammogram and colonoscopy for now. Patient has No headache, No chest pain, No abdominal pain - No Nausea, No new weakness tingling or numbness, No Cough - SOB.  No problems updated.  ALLERGIES: Allergies  Allergen Reactions  . Codeine   . Penicillins   . Sulfa Antibiotics     PAST MEDICAL HISTORY: Past Medical History  Diagnosis Date  . Hypertension   . Stroke     MEDICATIONS AT HOME: Prior to Admission medications   Medication Sig Start Date End Date Taking? Authorizing Provider  amLODipine (NORVASC) 10 MG tablet Take 1 tablet (10 mg total) by mouth daily. 04/14/14  Yes Tresa Garter, MD  aspirin 81 MG EC tablet Take 1 tablet (81 mg total) by mouth daily. Swallow whole. 02/13/13  Yes Reyne Dumas, MD  hydrochlorothiazide (HYDRODIURIL) 12.5 MG tablet Take 1 tablet (12.5 mg total) by mouth daily. 04/14/14  Yes Tresa Garter, MD  irbesartan (AVAPRO) 300 MG tablet Take 1 tablet (300 mg total) by mouth at bedtime. 04/14/14  Yes Tresa Garter, MD  multivitamin-iron-minerals-folic acid (CENTRUM) chewable tablet Chew 1 tablet by mouth daily.   Yes Historical Provider, MD  thiamine (VITAMIN B-1) 100 MG tablet Take 100 mg by mouth daily.   Yes Historical Provider, MD  vitamin B-12  (CYANOCOBALAMIN) 100 MCG tablet Take 100 mcg by mouth daily.   Yes Historical Provider, MD  diclofenac sodium (VOLTAREN) 1 % GEL Apply 2 g topically 2 (two) times daily. Patient not taking: Reported on 11/06/2014 01/13/14   Lance Bosch, NP  potassium chloride SA (K-DUR,KLOR-CON) 20 MEQ tablet Take 1 tablet (20 mEq total) by mouth daily. Patient not taking: Reported on 11/06/2014 10/11/13   Tresa Garter, MD     Objective:   Filed Vitals:   11/06/14 1450  BP: 128/75  Pulse: 70  Temp: 98.2 F (36.8 C)  TempSrc: Oral  Resp: 16  Height: 5\' 3"  (1.6 m)  Weight: 151 lb (68.493 kg)  SpO2: 100%    Exam General appearance : Awake, alert, not in any distress. Speech Clear. Not toxic looking HEENT: Atraumatic and Normocephalic, pupils equally reactive to light and accomodation Neck: supple, no JVD. No cervical lymphadenopathy.  Chest:Good air entry bilaterally, no added sounds  CVS: S1 S2 regular, no murmurs.  Abdomen: Bowel sounds present, Non tender and not distended with no gaurding, rigidity or rebound. Extremities: B/L Lower Ext shows no edema, both legs are warm to touch Neurology: Awake alert, and oriented X 3, CN II-XII intact, Non focal Skin:No Rash  Data Review Lab Results  Component Value Date   HGBA1C 5.4 01/18/2010   HGBA1C  05/13/2007    5.3 (NOTE)   The ADA recommends the following  therapeutic goals for glycemic   control related to Hgb A1C measurement:   Goal of Therapy:   < 7.0% Hgb A1C   Action Suggested:  > 8.0% Hgb A1C   Ref:  Diabetes Care, 22, Suppl. 1, 1999     Assessment & Plan   1. Essential hypertension, benign  - CBC with Differential/Platelet - COMPLETE METABOLIC PANEL WITH GFR - Lipid panel - TSH - Urinalysis, Complete  - We have discussed target BP range and blood pressure goal - I have advised patient to check BP regularly and to call us back or report to clinic if the numbers are consistently higher than 140/90  - We discussed the  importance of compliance with medical therapy and DASH diet recommended, consequences of uncontrolled hypertension discussed.  - continue current BP medications   Patient have been counseled extensively about nutrition and exercise Return in about 6 months (around 05/09/2015), or if symptoms worsen or fail to improve, for Follow up HTN.  The patient was given clear instructions to go to ER or return to medical center if symptoms don't improve, worsen or new problems develop. The patient verbalized understanding. The patient was told to call to get lab results if they haven't heard anything in the next week.   This note has been created with Surveyor, quantity. Any transcriptional errors are unintentional.    Angelica Chessman, MD, [place], [place], [place], [place] and [place], [place]   11/06/2014, 3:41 PM

## 2014-11-06 NOTE — Patient Instructions (Signed)
DASH Eating Plan DASH stands for "Dietary Approaches to Stop Hypertension." The DASH eating plan is a healthy eating plan that has been shown to reduce high blood pressure (hypertension). Additional health benefits may include reducing the risk of type 2 diabetes mellitus, heart disease, and stroke. The DASH eating plan may also help with weight loss. WHAT DO I NEED TO KNOW ABOUT THE DASH EATING PLAN? For the DASH eating plan, you will follow these general guidelines:  Choose foods with a percent daily value for sodium of less than 5% (as listed on the food label).  Use salt-free seasonings or herbs instead of table salt or sea salt.  Check with your health care provider or pharmacist before using salt substitutes.  Eat lower-sodium products, often labeled as "lower sodium" or "no salt added."  Eat fresh foods.  Eat more vegetables, fruits, and low-fat dairy products.  Choose whole grains. Look for the word "whole" as the first word in the ingredient list.  Choose fish and skinless chicken or turkey more often than red meat. Limit fish, poultry, and meat to 6 oz (170 g) each day.  Limit sweets, desserts, sugars, and sugary drinks.  Choose heart-healthy fats.  Limit cheese to 1 oz (28 g) per day.  Eat more home-cooked food and less restaurant, buffet, and fast food.  Limit fried foods.  Cook foods using methods other than frying.  Limit canned vegetables. If you do use them, rinse them well to decrease the sodium.  When eating at a restaurant, ask that your food be prepared with less salt, or no salt if possible. WHAT FOODS CAN I EAT? Seek help from a dietitian for individual calorie needs. Grains Whole grain or whole wheat bread. Brown rice. Whole grain or whole wheat pasta. Quinoa, bulgur, and whole grain cereals. Low-sodium cereals. Corn or whole wheat flour tortillas. Whole grain cornbread. Whole grain crackers. Low-sodium crackers. Vegetables Fresh or frozen vegetables  (raw, steamed, roasted, or grilled). Low-sodium or reduced-sodium tomato and vegetable juices. Low-sodium or reduced-sodium tomato sauce and paste. Low-sodium or reduced-sodium canned vegetables.  Fruits All fresh, canned (in natural juice), or frozen fruits. Meat and Other Protein Products Ground beef (85% or leaner), grass-fed beef, or beef trimmed of fat. Skinless chicken or turkey. Ground chicken or turkey. Pork trimmed of fat. All fish and seafood. Eggs. Dried beans, peas, or lentils. Unsalted nuts and seeds. Unsalted canned beans. Dairy Low-fat dairy products, such as skim or 1% milk, 2% or reduced-fat cheeses, low-fat ricotta or cottage cheese, or plain low-fat yogurt. Low-sodium or reduced-sodium cheeses. Fats and Oils Tub margarines without trans fats. Light or reduced-fat mayonnaise and salad dressings (reduced sodium). Avocado. Safflower, olive, or canola oils. Natural peanut or almond butter. Other Unsalted popcorn and pretzels. The items listed above may not be a complete list of recommended foods or beverages. Contact your dietitian for more options. WHAT FOODS ARE NOT RECOMMENDED? Grains White bread. White pasta. White rice. Refined cornbread. Bagels and croissants. Crackers that contain trans fat. Vegetables Creamed or fried vegetables. Vegetables in a cheese sauce. Regular canned vegetables. Regular canned tomato sauce and paste. Regular tomato and vegetable juices. Fruits Dried fruits. Canned fruit in light or heavy syrup. Fruit juice. Meat and Other Protein Products Fatty cuts of meat. Ribs, chicken wings, bacon, sausage, bologna, salami, chitterlings, fatback, hot dogs, bratwurst, and packaged luncheon meats. Salted nuts and seeds. Canned beans with salt. Dairy Whole or 2% milk, cream, half-and-half, and cream cheese. Whole-fat or sweetened yogurt. Full-fat   cheeses or blue cheese. Nondairy creamers and whipped toppings. Processed cheese, cheese spreads, or cheese  curds. Condiments Onion and garlic salt, seasoned salt, table salt, and sea salt. Canned and packaged gravies. Worcestershire sauce. Tartar sauce. Barbecue sauce. Teriyaki sauce. Soy sauce, including reduced sodium. Steak sauce. Fish sauce. Oyster sauce. Cocktail sauce. Horseradish. Ketchup and mustard. Meat flavorings and tenderizers. Bouillon cubes. Hot sauce. Tabasco sauce. Marinades. Taco seasonings. Relishes. Fats and Oils Butter, stick margarine, lard, shortening, ghee, and bacon fat. Coconut, palm kernel, or palm oils. Regular salad dressings. Other Pickles and olives. Salted popcorn and pretzels. The items listed above may not be a complete list of foods and beverages to avoid. Contact your dietitian for more information. WHERE CAN I FIND MORE INFORMATION? National Heart, Lung, and Blood Institute: www.nhlbi.nih.gov/health/health-topics/topics/dash/ Document Released: 07/21/2011 Document Revised: 12/16/2013 Document Reviewed: 06/05/2013 ExitCare Patient Information 2015 ExitCare[employer] LLC. This information is not intended to replace advice given to you by your health care provider. Make sure you discuss any questions you have with your health care provider. Hypertension Hypertension, commonly called high blood pressure, is when the force of blood pumping through your arteries is too strong. Your arteries are the blood vessels that carry blood from your heart throughout your body. A blood pressure reading consists of a higher number over a lower number, such as 110/72. The higher number (systolic) is the pressure inside your arteries when your heart pumps. The lower number (diastolic) is the pressure inside your arteries when your heart relaxes. Ideally you want your blood pressure below 120/80. Hypertension forces your heart to work harder to pump blood. Your arteries may become narrow or stiff. Having hypertension puts you at risk for heart disease, stroke, and other problems.  RISK  FACTORS Some risk factors for high blood pressure are controllable. Others are not.  Risk factors you cannot control include:   Race. You may be at higher risk if you are African American.  Age. Risk increases with age.  Gender. Men are at higher risk than women before age 45 years. After age 65, women are at higher risk than men. Risk factors you can control include:  Not getting enough exercise or physical activity.  Being overweight.  Getting too much fat, sugar, calories, or salt in your diet.  Drinking too much alcohol. SIGNS AND SYMPTOMS Hypertension does not usually cause signs or symptoms. Extremely high blood pressure (hypertensive crisis) may cause headache, anxiety, shortness of breath, and nosebleed. DIAGNOSIS  To check if you have hypertension, your health care provider will measure your blood pressure while you are seated, with your arm held at the level of your heart. It should be measured at least twice using the same arm. Certain conditions can cause a difference in blood pressure between your right and left arms. A blood pressure reading that is higher than normal on one occasion does not mean that you need treatment. If one blood pressure reading is high, ask your health care provider about having it checked again. TREATMENT  Treating high blood pressure includes making lifestyle changes and possibly taking medicine. Living a healthy lifestyle can help lower high blood pressure. You may need to change some of your habits. Lifestyle changes may include:  Following the DASH diet. This diet is high in fruits, vegetables, and whole grains. It is low in salt, red meat, and added sugars.  Getting at least 2 hours of brisk physical activity every week.  Losing weight if necessary.  Not smoking.  Limiting   alcoholic beverages.  Learning ways to reduce stress. If lifestyle changes are not enough to get your blood pressure under control, your health care provider may  prescribe medicine. You may need to take more than one. Work closely with your health care provider to understand the risks and benefits. HOME CARE INSTRUCTIONS  Have your blood pressure rechecked as directed by your health care provider.   Take medicines only as directed by your health care provider. Follow the directions carefully. Blood pressure medicines must be taken as prescribed. The medicine does not work as well when you skip doses. Skipping doses also puts you at risk for problems.   Do not smoke.   Monitor your blood pressure at home as directed by your health care provider. SEEK MEDICAL CARE IF:   You think you are having a reaction to medicines taken.  You have recurrent headaches or feel dizzy.  You have swelling in your ankles.  You have trouble with your vision. SEEK IMMEDIATE MEDICAL CARE IF:  You develop a severe headache or confusion.  You have unusual weakness, numbness, or feel faint.  You have severe chest or abdominal pain.  You vomit repeatedly.  You have trouble breathing. MAKE SURE YOU:   Understand these instructions.  Will watch your condition.  Will get help right away if you are not doing well or get worse. Document Released: 08/01/2005 Document Revised: 12/16/2013 Document Reviewed: 05/24/2013 ExitCare Patient Information 2015 ExitCare, LLC. This information is not intended to replace advice given to you by your health care provider. Make sure you discuss any questions you have with your health care provider.  

## 2014-11-06 NOTE — Progress Notes (Signed)
Pt is here following up on her HTN. Pt has no C.C. Today.

## 2014-11-07 LAB — URINALYSIS, COMPLETE
Bilirubin Urine: NEGATIVE
Casts: NONE SEEN
Crystals: NONE SEEN
GLUCOSE, UA: NEGATIVE mg/dL
HGB URINE DIPSTICK: NEGATIVE
KETONES UR: 15 mg/dL — AB
Nitrite: POSITIVE — AB
Protein, ur: NEGATIVE mg/dL
Specific Gravity, Urine: 1.015 (ref 1.005–1.030)
Squamous Epithelial / LPF: NONE SEEN
Urobilinogen, UA: 0.2 mg/dL (ref 0.0–1.0)
pH: 5.5 (ref 5.0–8.0)

## 2014-11-07 LAB — TSH: TSH: 3.051 u[IU]/mL (ref 0.350–4.500)

## 2014-11-21 ENCOUNTER — Telehealth: Payer: Self-pay

## 2014-11-21 MED ORDER — CIPROFLOXACIN HCL 500 MG PO TABS: 500.0000 mg | ORAL_TABLET | Freq: Two times a day (BID) | ORAL | Status: DC

## 2014-11-21 NOTE — Telephone Encounter (Signed)
Patient not available Left message on voice mail to return our call Prescription sent to pharmacy on file 

## 2014-11-21 NOTE — Telephone Encounter (Signed)
-----   Message from Tresa Garter, MD sent at 11/14/2014  5:58 PM EDT ----- Please inform patient that her laboratory test results are mostly within normal limit. She however has evidence of urinary tract infection was treated with antibiotics.  Please call in prescription for ciprofloxacin 500 mg tablet by mouth twice a day for 5 days.

## 2014-11-24 ENCOUNTER — Telehealth: Payer: Self-pay | Admitting: Internal Medicine

## 2014-11-24 NOTE — Telephone Encounter (Signed)
Pt called is returning nurse's phone call about results. Please f/u with pt to review results. She received a call from pharmacy stating that her medication are ready but pt is unaware what medication is for.

## 2014-11-24 NOTE — Telephone Encounter (Signed)
Pt would like clarification as to why she needs to take antibiotics.  Please f/u with pt with more information.

## 2014-11-25 ENCOUNTER — Telehealth: Payer: Self-pay | Admitting: Internal Medicine

## 2014-11-25 NOTE — Telephone Encounter (Signed)
Patient is calling to speak to a nurse about why she is taking the antibiotic. Please f/u with pt.

## 2015-04-13 ENCOUNTER — Other Ambulatory Visit: Payer: Self-pay | Admitting: Internal Medicine

## 2015-04-16 NOTE — Telephone Encounter (Signed)
Patient called to request a med refill for the following medications:  irbesartan (AVAPRO) 300 MG tablet  amLODipine (NORVASC) 10 MG tablet hydrochlorothiazide (HYDRODIURIL) 12.5 MG tablet   Patient will take her last pills today, patient uses [facility] on [address]. Please f/u with pt.

## 2015-04-17 ENCOUNTER — Other Ambulatory Visit: Payer: Self-pay | Admitting: *Deleted

## 2015-04-17 DIAGNOSIS — I10 Essential (primary) hypertension: Secondary | ICD-10-CM

## 2015-04-17 MED ORDER — HYDROCHLOROTHIAZIDE 12.5 MG PO TABS: 12.5000 mg | ORAL_TABLET | Freq: Every day | ORAL | Status: DC

## 2015-04-21 ENCOUNTER — Telehealth: Payer: Self-pay | Admitting: Internal Medicine

## 2015-04-21 NOTE — Telephone Encounter (Signed)
hydrochlorothiazide (HYDRODIURIL) 12.5 MG tablet amLODipine (NORVASC) 10 MG tablet irbesartan (AVAPRO) 300 MG tablet

## 2015-04-24 ENCOUNTER — Ambulatory Visit: Payer: Medicare Other | Attending: Family Medicine | Admitting: Family Medicine

## 2015-04-24 ENCOUNTER — Encounter: Payer: Self-pay | Admitting: Family Medicine

## 2015-04-24 VITALS — BP 138/78 | HR 66 | Temp 98.8°F | Resp 18 | Ht 63.0 in | Wt 152.2 lb

## 2015-04-24 DIAGNOSIS — Z8673 Personal history of transient ischemic attack (TIA), and cerebral infarction without residual deficits: Secondary | ICD-10-CM | POA: Diagnosis not present

## 2015-04-24 DIAGNOSIS — I10 Essential (primary) hypertension: Secondary | ICD-10-CM | POA: Diagnosis not present

## 2015-04-24 DIAGNOSIS — Z87891 Personal history of nicotine dependence: Secondary | ICD-10-CM | POA: Diagnosis not present

## 2015-04-24 MED ORDER — AMLODIPINE BESYLATE 10 MG PO TABS: 10.0000 mg | ORAL_TABLET | Freq: Every day | ORAL | Status: DC

## 2015-04-24 MED ORDER — IRBESARTAN 300 MG PO TABS: 300.0000 mg | ORAL_TABLET | Freq: Every day | ORAL | Status: DC

## 2015-04-24 MED ORDER — HYDROCHLOROTHIAZIDE 12.5 MG PO TABS: 12.5000 mg | ORAL_TABLET | Freq: Every day | ORAL | Status: DC

## 2015-04-24 NOTE — Progress Notes (Signed)
   Subjective:    Patient ID: Nathanial Rancher, female    DOB: 01/19/1946, 69 y.o.   MRN: [id number]  HPI Patient with HTN, here for follow up and medication refills.  Labs done earlier this year were unremarkable. BP well controlled on Avapro, HCTZ and amlodipine.  Also on ASA 81mg  daily.  History of breast cancer s/p XRT in 1987.    S/p CVA with L side residual weakness, she believes her CVA was hemorrhagic.    MRI brain 05/14/2007: "IMPRESSION: 1. Acute right hemisphere infarct affecting the thalamus and posterior limb of the internal capsule.  2. Premature atrophy with advanced small vessel disease.  3. Small microbleeds in the brainstem and right hemisphere likely due to chronic hypertension."  Former smoker.   ROS: Reports that she has no fevers or chills, no chest pain or palpitations. No shortness of breath or cough. No dysuria or hematuria, polyuria or incontinence.   Exam: Well appearing, no apparent distress HEENT neck supple, no cervical adenopathy. Moist mucus membranes.  COR Regular S1S2, no extra sounds PULM Clear bilaterally, no rales or wheezes.  EXTS no lower extremity edema.  NEURO Walks with cane; notable for L sided UE weakness.    Review of Systems     Objective:   Physical Exam        Assessment & Plan:

## 2015-04-24 NOTE — Assessment & Plan Note (Signed)
Patient with BP well controlled, for refills of medications.  Labs unremarkable earlier this year.  Ninety-day refills sent to her pharmacy, follow up in six months.   Discussed recommendation for flu vaccine this fall.

## 2015-04-24 NOTE — Progress Notes (Signed)
Patient is here for HTN F/U. Patient denies any pain at the moment.  Patient expresses dissatisfaction with communication about medication refill.  Patient has taken her medication today. Patient requesting medication refill. Patient would like scripts in paper form if possible.

## 2015-04-24 NOTE — Patient Instructions (Signed)
It was a pleasure to see you today.   I sent refills for the Avapro, Norvasc and HCTZ (90 days) to your [facility] today.   I am glad you are planning to get the flu shot at [facility]!  Follow up with Dr Doreene Burke in 6 months.

## 2015-04-28 ENCOUNTER — Telehealth: Payer: Self-pay | Admitting: Internal Medicine

## 2015-04-28 NOTE — Telephone Encounter (Signed)
amLODipine (NORVASC) 10 MG tablet irbesartan (AVAPRO) 300 MG tablet

## 2015-05-08 DIAGNOSIS — Z23 Encounter for immunization: Secondary | ICD-10-CM | POA: Diagnosis not present

## 2015-08-04 ENCOUNTER — Encounter: Payer: Self-pay | Admitting: *Deleted

## 2015-08-04 NOTE — Telephone Encounter (Signed)
Patient has refills that were already prescribed in September when she was seen by MD.

## 2015-08-04 NOTE — Telephone Encounter (Signed)
Patient seen my MD after request made.

## 2015-08-04 NOTE — Telephone Encounter (Signed)
Error

## 2015-10-22 ENCOUNTER — Encounter: Payer: Self-pay | Admitting: *Deleted

## 2016-01-07 ENCOUNTER — Encounter: Payer: Self-pay | Admitting: Internal Medicine

## 2016-01-07 ENCOUNTER — Ambulatory Visit: Payer: Medicare Other | Attending: Internal Medicine | Admitting: Internal Medicine

## 2016-01-07 VITALS — BP 128/76 | HR 66 | Temp 97.9°F | Resp 18 | Ht 64.0 in | Wt 159.4 lb

## 2016-01-07 DIAGNOSIS — Z880 Allergy status to penicillin: Secondary | ICD-10-CM | POA: Diagnosis not present

## 2016-01-07 DIAGNOSIS — Z1239 Encounter for other screening for malignant neoplasm of breast: Secondary | ICD-10-CM | POA: Insufficient documentation

## 2016-01-07 DIAGNOSIS — Z7982 Long term (current) use of aspirin: Secondary | ICD-10-CM | POA: Diagnosis not present

## 2016-01-07 DIAGNOSIS — E785 Hyperlipidemia, unspecified: Secondary | ICD-10-CM | POA: Insufficient documentation

## 2016-01-07 DIAGNOSIS — Z1231 Encounter for screening mammogram for malignant neoplasm of breast: Secondary | ICD-10-CM | POA: Diagnosis not present

## 2016-01-07 DIAGNOSIS — Z8673 Personal history of transient ischemic attack (TIA), and cerebral infarction without residual deficits: Secondary | ICD-10-CM

## 2016-01-07 DIAGNOSIS — Z79899 Other long term (current) drug therapy: Secondary | ICD-10-CM | POA: Diagnosis not present

## 2016-01-07 DIAGNOSIS — I10 Essential (primary) hypertension: Secondary | ICD-10-CM | POA: Insufficient documentation

## 2016-01-07 LAB — CBC WITH DIFFERENTIAL/PLATELET
BASOS PCT: 0 %
Basophils Absolute: 0 cells/uL (ref 0–200)
EOS PCT: 1 %
Eosinophils Absolute: 103 cells/uL (ref 15–500)
HCT: 40.8 % (ref 35.0–45.0)
Hemoglobin: 14 g/dL (ref 11.7–15.5)
LYMPHS PCT: 25 %
Lymphs Abs: 2575 cells/uL (ref 850–3900)
MCH: 32 pg (ref 27.0–33.0)
MCHC: 34.3 g/dL (ref 32.0–36.0)
MCV: 93.2 fL (ref 80.0–100.0)
MPV: 10.6 fL (ref 7.5–12.5)
Monocytes Absolute: 721 cells/uL (ref 200–950)
Monocytes Relative: 7 %
NEUTROS PCT: 67 %
Neutro Abs: 6901 cells/uL (ref 1500–7800)
Platelets: 318 10*3/uL (ref 140–400)
RBC: 4.38 MIL/uL (ref 3.80–5.10)
RDW: 12.6 % (ref 11.0–15.0)
WBC: 10.3 10*3/uL (ref 3.8–10.8)

## 2016-01-07 LAB — COMPLETE METABOLIC PANEL WITH GFR
ALBUMIN: 4.4 g/dL (ref 3.6–5.1)
ALK PHOS: 75 U/L (ref 33–130)
ALT: 21 U/L (ref 6–29)
AST: 20 U/L (ref 10–35)
BUN: 16 mg/dL (ref 7–25)
CALCIUM: 9.8 mg/dL (ref 8.6–10.4)
CO2: 24 mmol/L (ref 20–31)
Chloride: 97 mmol/L — ABNORMAL LOW (ref 98–110)
Creat: 0.62 mg/dL (ref 0.50–0.99)
GFR, Est African American: >89 mL/min (ref >=60)
Glucose, Bld: 85 mg/dL (ref 65–99)
POTASSIUM: 3.9 mmol/L (ref 3.5–5.3)
Sodium: 135 mmol/L (ref 135–146)
Total Bilirubin: 0.5 mg/dL (ref 0.2–1.2)
Total Protein: 8.1 g/dL (ref 6.1–8.1)

## 2016-01-07 LAB — LIPID PANEL
CHOL/HDL RATIO: 3.2 ratio (ref <=5.0)
CHOLESTEROL: 198 mg/dL (ref 125–200)
HDL: 61 mg/dL (ref >=46)
LDL Cholesterol: 115 mg/dL (ref <130)
Triglycerides: 111 mg/dL (ref <150)
VLDL: 22 mg/dL (ref <30)

## 2016-01-07 MED ORDER — IRBESARTAN 300 MG PO TABS: 300.0000 mg | ORAL_TABLET | Freq: Every day | ORAL | Status: DC

## 2016-01-07 MED ORDER — AMLODIPINE BESYLATE 10 MG PO TABS: 10.0000 mg | ORAL_TABLET | Freq: Every day | ORAL | Status: DC

## 2016-01-07 MED ORDER — HYDROCHLOROTHIAZIDE 12.5 MG PO CAPS: 12.5000 mg | ORAL_CAPSULE | Freq: Every day | ORAL | Status: DC

## 2016-01-07 NOTE — Progress Notes (Signed)
Pt here for blood pressure follow up and needs refill on all medications. Denies pain

## 2016-01-07 NOTE — Progress Notes (Signed)
Patient ID: Michelle Sosa, female   DOB: October 26, 1945, 71 y.o.   MRN: [id number]   Michelle Sosa, is a 70 y.o. female  [id number]  [id number]  DOB - 1946-06-28  Chief Complaint  Patient presents with  . Follow-up    HTN        Subjective:   Michelle Sosa is a 70 y.o. female with history of hypertension and remote CVA here today for a follow up visit and medication refills. Patient has no new complaints today. She denies any pain. She is compliant with her medications and blood pressure has been controlled. She is due for mammogram. Patient has No headache, No chest pain, No abdominal pain - No Nausea, No new weakness tingling or numbness, No Cough - SOB.  Problem  Breast Cancer Screening  Encounter for Screening Mammogram for Breast Cancer  Dyslipidemia    ALLERGIES: Allergies  Allergen Reactions  . Codeine   . Penicillins   . Sulfa Antibiotics     PAST MEDICAL HISTORY: Past Medical History  Diagnosis Date  . Hypertension   . Stroke [facility])     MEDICATIONS AT HOME: Prior to Admission medications   Medication Sig Start Date End Date Taking? Authorizing Provider  amLODipine (NORVASC) 10 MG tablet Take 1 tablet (10 mg total) by mouth daily. 01/07/16  Yes Tresa Garter, MD  aspirin 81 MG EC tablet Take 1 tablet (81 mg total) by mouth daily. Swallow whole. 02/13/13  Yes Reyne Dumas, MD  hydrochlorothiazide (MICROZIDE) 12.5 MG capsule Take 1 capsule (12.5 mg total) by mouth daily. 01/07/16  Yes Tresa Garter, MD  irbesartan (AVAPRO) 300 MG tablet Take 1 tablet (300 mg total) by mouth at bedtime. 01/07/16  Yes Tresa Garter, MD  multivitamin-iron-minerals-folic acid (CENTRUM) chewable tablet Chew 1 tablet by mouth daily.   Yes Historical Provider, MD  thiamine (VITAMIN B-1) 100 MG tablet Take 100 mg by mouth daily.   Yes Historical Provider, MD  vitamin B-12 (CYANOCOBALAMIN) 100 MCG tablet Take 100 mcg by mouth daily.   Yes Historical Provider, MD    diclofenac sodium (VOLTAREN) 1 % GEL Apply 2 g topically 2 (two) times daily. Patient not taking: Reported on 01/07/2016 01/13/14   Lance Bosch, NP     Objective:   Filed Vitals:   01/07/16 1440  BP: 128/76  Pulse: 66  Temp: 97.9 F (36.6 C)  TempSrc: Oral  Resp: 18  Height: 5\' 4"  (1.626 m)  Weight: 159 lb 6.4 oz (72.303 kg)  SpO2: 98%    Exam General appearance : Awake, alert, not in any distress. Speech Clear. Not toxic looking, obese, poor dentition HEENT: Atraumatic and Normocephalic, pupils equally reactive to light and accomodation Neck: supple, no JVD. No cervical lymphadenopathy.  Chest:Good air entry bilaterally, no added sounds  CVS: S1 S2 regular, no murmurs.  Abdomen: Bowel sounds present, Non tender and not distended with no gaurding, rigidity or rebound. Extremities: B/L Lower Ext shows no edema, both legs are warm to touch Neurology: Awake alert, and oriented X 3, CN II-XII intact, Non focal Skin: No Rash  Data Review Lab Results  Component Value Date   HGBA1C 5.4 01/18/2010   HGBA1C  05/13/2007    5.3 (NOTE)   The ADA recommends the following therapeutic goals for glycemic   control related to Hgb A1C measurement:   Goal of Therapy:   < 7.0% Hgb A1C   Action Suggested:  > 8.0% Hgb A1C   Ref:  Diabetes  Care, 22, Suppl. 1, 1999     Assessment & Plan   1. Essential hypertension, benign Refill - amLODipine (NORVASC) 10 MG tablet; Take 1 tablet (10 mg total) by mouth daily.  Dispense: 90 tablet; Refill: 3 - irbesartan (AVAPRO) 300 MG tablet; Take 1 tablet (300 mg total) by mouth at bedtime.  Dispense: 90 tablet; Refill: 3 - hydrochlorothiazide (MICROZIDE) 12.5 MG capsule; Take 1 capsule (12.5 mg total) by mouth daily.  Dispense: 90 capsule; Refill: 3 - CBC with Differential/Platelet - COMPLETE METABOLIC PANEL WITH GFR - Urinalysis, Complete  2. History of stroke  Continue current medical care  3. Encounter for screening mammogram for breast  cancer  - MM Digital Screening; Future  4. Dyslipidemia  - Lipid panel  To address this please limit saturated fat to no more than 7% of your calories, limit cholesterol to 200 mg/day, increase fiber and exercise as tolerated. If needed we may add another cholesterol lowering medication to your regimen.   Patient have been counseled extensively about nutrition and exercise  Return in about 6 months (around 07/09/2016) for Annual Physical, Follow up HTN.  The patient was given clear instructions to go to ER or return to medical center if symptoms don't improve, worsen or new problems develop. The patient verbalized understanding. The patient was told to call to get lab results if they haven't heard anything in the next week.   This note has been created with Surveyor, quantity. Any transcriptional errors are unintentional.    Angelica Chessman, MD, [place], [place], [place], [place] and [place], [place]   01/07/2016, 3:28 PM

## 2016-01-07 NOTE — Patient Instructions (Signed)
DASH Eating Plan °DASH stands for "Dietary Approaches to Stop Hypertension." The DASH eating plan is a healthy eating plan that has been shown to reduce high blood pressure (hypertension). Additional health benefits may include reducing the risk of type 2 diabetes mellitus, heart disease, and stroke. The DASH eating plan may also help with weight loss. °WHAT DO I NEED TO KNOW ABOUT THE DASH EATING PLAN? °For the DASH eating plan, you will follow these general guidelines: °· Choose foods with a percent daily value for sodium of less than 5% (as listed on the food label). °· Use salt-free seasonings or herbs instead of table salt or sea salt. °· Check with your health care provider or pharmacist before using salt substitutes. °· Eat lower-sodium products, often labeled as "lower sodium" or "no salt added." °· Eat fresh foods. °· Eat more vegetables, fruits, and low-fat dairy products. °· Choose whole grains. Look for the word "whole" as the first word in the ingredient list. °· Choose fish and skinless chicken or turkey more often than red meat. Limit fish, poultry, and meat to 6 oz (170 g) each day. °· Limit sweets, desserts, sugars, and sugary drinks. °· Choose heart-healthy fats. °· Limit cheese to 1 oz (28 g) per day. °· Eat more home-cooked food and less restaurant, buffet, and fast food. °· Limit fried foods. °· Cook foods using methods other than frying. °· Limit canned vegetables. If you do use them, rinse them well to decrease the sodium. °· When eating at a restaurant, ask that your food be prepared with less salt, or no salt if possible. °WHAT FOODS CAN I EAT? °Seek help from a dietitian for individual calorie needs. °Grains °Whole grain or whole wheat bread. Brown rice. Whole grain or whole wheat pasta. Quinoa, bulgur, and whole grain cereals. Low-sodium cereals. Corn or whole wheat flour tortillas. Whole grain cornbread. Whole grain crackers. Low-sodium crackers. °Vegetables °Fresh or frozen vegetables  (raw, steamed, roasted, or grilled). Low-sodium or reduced-sodium tomato and vegetable juices. Low-sodium or reduced-sodium tomato sauce and paste. Low-sodium or reduced-sodium canned vegetables.  °Fruits °All fresh, canned (in natural juice), or frozen fruits. °Meat and Other Protein Products °Ground beef (85% or leaner), grass-fed beef, or beef trimmed of fat. Skinless chicken or turkey. Ground chicken or turkey. Pork trimmed of fat. All fish and seafood. Eggs. Dried beans, peas, or lentils. Unsalted nuts and seeds. Unsalted canned beans. °Dairy °Low-fat dairy products, such as skim or 1% milk, 2% or reduced-fat cheeses, low-fat ricotta or cottage cheese, or plain low-fat yogurt. Low-sodium or reduced-sodium cheeses. °Fats and Oils °Tub margarines without trans fats. Light or reduced-fat mayonnaise and salad dressings (reduced sodium). Avocado. Safflower, olive, or canola oils. Natural peanut or almond butter. °Other °Unsalted popcorn and pretzels. °The items listed above may not be a complete list of recommended foods or beverages. Contact your dietitian for more options. °WHAT FOODS ARE NOT RECOMMENDED? °Grains °White bread. White pasta. White rice. Refined cornbread. Bagels and croissants. Crackers that contain trans fat. °Vegetables °Creamed or fried vegetables. Vegetables in a cheese sauce. Regular canned vegetables. Regular canned tomato sauce and paste. Regular tomato and vegetable juices. °Fruits °Dried fruits. Canned fruit in light or heavy syrup. Fruit juice. °Meat and Other Protein Products °Fatty cuts of meat. Ribs, chicken wings, bacon, sausage, bologna, salami, chitterlings, fatback, hot dogs, bratwurst, and packaged luncheon meats. Salted nuts and seeds. Canned beans with salt. °Dairy °Whole or 2% milk, cream, half-and-half, and cream cheese. Whole-fat or sweetened yogurt. Full-fat   cheeses or blue cheese. Nondairy creamers and whipped toppings. Processed cheese, cheese spreads, or cheese  curds. °Condiments °Onion and garlic salt, seasoned salt, table salt, and sea salt. Canned and packaged gravies. Worcestershire sauce. Tartar sauce. Barbecue sauce. Teriyaki sauce. Soy sauce, including reduced sodium. Steak sauce. Fish sauce. Oyster sauce. Cocktail sauce. Horseradish. Ketchup and mustard. Meat flavorings and tenderizers. Bouillon cubes. Hot sauce. Tabasco sauce. Marinades. Taco seasonings. Relishes. °Fats and Oils °Butter, stick margarine, lard, shortening, ghee, and bacon fat. Coconut, palm kernel, or palm oils. Regular salad dressings. °Other °Pickles and olives. Salted popcorn and pretzels. °The items listed above may not be a complete list of foods and beverages to avoid. Contact your dietitian for more information. °WHERE CAN I FIND MORE INFORMATION? °National Heart, Lung, and [employer] Institute: www.nhlbi.nih.gov/health/health-topics/topics/dash/ °  °This information is not intended to replace advice given to you by your health care provider. Make sure you discuss any questions you have with your health care provider. °  °Document Released: 07/21/2011 Document Revised: 08/22/2014 Document Reviewed: 06/05/2013 °Elsevier Interactive Patient Education ©2016 Else[employer] Inc. ° °Hypertension °Hypertension, commonly called high blood pressure, is when the force of blood pumping through your arteries is too strong. Your arteries are the blood vessels that carry blood from your heart throughout your body. A blood pressure reading consists of a higher number over a lower number, such as 110/72. The higher number (systolic) is the pressure inside your arteries when your heart pumps. The lower number (diastolic) is the pressure inside your arteries when your heart relaxes. Ideally you want your blood pressure below 120/80. °Hypertension forces your heart to work harder to pump blood. Your arteries may become narrow or stiff. Having untreated or uncontrolled hypertension can cause heart attack, stroke, kidney  disease, and other problems. °RISK FACTORS °Some risk factors for high blood pressure are controllable. Others are not.  °Risk factors you cannot control include:  °· Race. You may be at higher risk if you are African American. °· Age. Risk increases with age. °· Gender. Men are at higher risk than women before age 45 years. After age 65, women are at higher risk than men. °Risk factors you can control include: °· Not getting enough exercise or physical activity. °· Being overweight. °· Getting too much fat, sugar, calories, or salt in your diet. °· Drinking too much alcohol. °SIGNS AND SYMPTOMS °Hypertension does not usually cause signs or symptoms. Extremely high blood pressure (hypertensive crisis) may cause headache, anxiety, shortness of breath, and nosebleed. °DIAGNOSIS °To check if you have hypertension, your health care provider will measure your blood pressure while you are seated, with your arm held at the level of your heart. It should be measured at least twice using the same arm. Certain conditions can cause a difference in blood pressure between your right and left arms. A blood pressure reading that is higher than normal on one occasion does not mean that you need treatment. If it is not clear whether you have high blood pressure, you may be asked to return on a different day to have your blood pressure checked again. Or, you may be asked to monitor your blood pressure at home for 1 or more weeks. °TREATMENT °Treating high blood pressure includes making lifestyle changes and possibly taking medicine. Living a healthy lifestyle can help lower high blood pressure. You may need to change some of your habits. °Lifestyle changes may include: °· Following the DASH diet. This diet is high in fruits, vegetables, and whole   grains. It is low in salt, red meat, and added sugars. °· Keep your sodium intake below 2,300 mg per day. °· Getting at least 30-45 minutes of aerobic exercise at least 4 times per  week. °· Losing weight if necessary. °· Not smoking. °· Limiting alcoholic beverages. °· Learning ways to reduce stress. °Your health care provider may prescribe medicine if lifestyle changes are not enough to get your blood pressure under control, and if one of the following is true: °· You are 18-59 years of age and your systolic blood pressure is above 140. °· You are 60 years of age or older, and your systolic blood pressure is above 150. °· Your diastolic blood pressure is above 90. °· You have diabetes, and your systolic blood pressure is over 140 or your diastolic blood pressure is over 90. °· You have kidney disease and your blood pressure is above 140/90. °· You have heart disease and your blood pressure is above 140/90. °Your personal target blood pressure may vary depending on your medical conditions, your age, and other factors. °HOME CARE INSTRUCTIONS °· Have your blood pressure rechecked as directed by your health care provider.   °· Take medicines only as directed by your health care provider. Follow the directions carefully. Blood pressure medicines must be taken as prescribed. The medicine does not work as well when you skip doses. Skipping doses also puts you at risk for problems. °· Do not smoke.   °· Monitor your blood pressure at home as directed by your health care provider.  °SEEK MEDICAL CARE IF:  °· You think you are having a reaction to medicines taken. °· You have recurrent headaches or feel dizzy. °· You have swelling in your ankles. °· You have trouble with your vision. °SEEK IMMEDIATE MEDICAL CARE IF: °· You develop a severe headache or confusion. °· You have unusual weakness, numbness, or feel faint. °· You have severe chest or abdominal pain. °· You vomit repeatedly. °· You have trouble breathing. °MAKE SURE YOU:  °· Understand these instructions. °· Will watch your condition. °· Will get help right away if you are not doing well or get worse. °  °This information is not intended to  replace advice given to you by your health care provider. Make sure you discuss any questions you have with your health care provider. °  °Document Released: 08/01/2005 Document Revised: 12/16/2014 Document Reviewed: 05/24/2013 °Elsevier Interactive Patient Education ©2016 Elsevier Inc. ° °

## 2016-01-08 ENCOUNTER — Other Ambulatory Visit: Payer: Self-pay | Admitting: Internal Medicine

## 2016-01-08 DIAGNOSIS — Z1231 Encounter for screening mammogram for malignant neoplasm of breast: Secondary | ICD-10-CM

## 2016-01-08 LAB — URINALYSIS, COMPLETE
BACTERIA UA: NONE SEEN [HPF]
Bilirubin Urine: NEGATIVE
CRYSTALS: NONE SEEN [HPF]
Casts: NONE SEEN [LPF]
Glucose, UA: NEGATIVE
Hgb urine dipstick: NEGATIVE
Nitrite: NEGATIVE
Protein, ur: NEGATIVE
RBC / HPF: NONE SEEN RBC/HPF (ref <=2)
Specific Gravity, Urine: 1.013 (ref 1.001–1.035)
Yeast: NONE SEEN [HPF]
pH: 6 (ref 5.0–8.0)

## 2016-01-12 ENCOUNTER — Other Ambulatory Visit: Payer: Self-pay | Admitting: Internal Medicine

## 2016-01-12 MED ORDER — CIPROFLOXACIN HCL 500 MG PO TABS: 500.0000 mg | ORAL_TABLET | Freq: Two times a day (BID) | ORAL | Status: DC

## 2016-01-13 ENCOUNTER — Telehealth: Payer: Self-pay | Admitting: *Deleted

## 2016-01-13 NOTE — Telephone Encounter (Signed)
Patient verified DOB Patient is aware of lab results being within normal limits.  Patient advised to pickup her antibiotic and begin taking the prescription to treat the UTI present in her urine. Patient picked up medication yesterday and has begun treatment. No further questions at this time.

## 2016-01-13 NOTE — Telephone Encounter (Signed)
-----   Message from Tresa Garter, MD sent at 01/12/2016  5:44 PM EDT ----- Please inform patient that her laboratory results are mostly within normal limits but her urine test shows evidence of urinary tract infection. Antibiotics since to the pharmacy for pickup.

## 2016-01-26 ENCOUNTER — Ambulatory Visit
Admission: RE | Admit: 2016-01-26 | Discharge: 2016-01-26 | Disposition: A | Discharge status: Home / Self Care | Payer: Medicare Other | Source: Ambulatory Visit | Attending: Internal Medicine | Admitting: Internal Medicine

## 2016-01-26 DIAGNOSIS — Z1231 Encounter for screening mammogram for malignant neoplasm of breast: Secondary | ICD-10-CM

## 2016-02-04 ENCOUNTER — Telehealth: Payer: Self-pay | Admitting: *Deleted

## 2016-02-04 NOTE — Telephone Encounter (Signed)
Patient verified DOB Patient is aware of mammogram showing no evidence of malignancy and a recommended repeat should be completed in one year. Patient expressed her understanding and had no further questions at this time. Patient states she will return a phone call with her immunization records which she received at the drug store over the past few years.

## 2016-02-04 NOTE — Telephone Encounter (Signed)
-----   Message from Tresa Garter, MD sent at 01/27/2016  4:23 PM EDT ----- Please inform patient that her screening mammogram shows no evidence of malignancy. Recommend screening mammogram in one year

## 2016-06-01 DIAGNOSIS — Z23 Encounter for immunization: Secondary | ICD-10-CM | POA: Diagnosis not present

## 2016-07-14 ENCOUNTER — Encounter: Payer: Self-pay | Admitting: Internal Medicine

## 2016-07-14 ENCOUNTER — Ambulatory Visit: Payer: Medicare Other | Attending: Internal Medicine | Admitting: Internal Medicine

## 2016-07-14 VITALS — BP 129/78 | HR 80 | Temp 98.7°F | Resp 18 | Ht 63.0 in | Wt 163.0 lb

## 2016-07-14 DIAGNOSIS — E785 Hyperlipidemia, unspecified: Secondary | ICD-10-CM | POA: Diagnosis not present

## 2016-07-14 DIAGNOSIS — Z7982 Long term (current) use of aspirin: Secondary | ICD-10-CM | POA: Diagnosis not present

## 2016-07-14 DIAGNOSIS — R300 Dysuria: Secondary | ICD-10-CM | POA: Diagnosis not present

## 2016-07-14 DIAGNOSIS — Z8673 Personal history of transient ischemic attack (TIA), and cerebral infarction without residual deficits: Secondary | ICD-10-CM | POA: Insufficient documentation

## 2016-07-14 DIAGNOSIS — Z79899 Other long term (current) drug therapy: Secondary | ICD-10-CM | POA: Insufficient documentation

## 2016-07-14 DIAGNOSIS — Z880 Allergy status to penicillin: Secondary | ICD-10-CM | POA: Insufficient documentation

## 2016-07-14 DIAGNOSIS — I10 Essential (primary) hypertension: Secondary | ICD-10-CM | POA: Diagnosis not present

## 2016-07-14 DIAGNOSIS — Z885 Allergy status to narcotic agent status: Secondary | ICD-10-CM | POA: Diagnosis not present

## 2016-07-14 DIAGNOSIS — Z882 Allergy status to sulfonamides status: Secondary | ICD-10-CM | POA: Insufficient documentation

## 2016-07-14 MED ORDER — HYDROCHLOROTHIAZIDE 12.5 MG PO CAPS: 12.5000 mg | ORAL_CAPSULE | Freq: Every day | ORAL | 3 refills | Status: DC

## 2016-07-14 MED ORDER — IRBESARTAN 300 MG PO TABS: 300.0000 mg | ORAL_TABLET | Freq: Every day | ORAL | 3 refills | Status: DC

## 2016-07-14 MED ORDER — AMLODIPINE BESYLATE 10 MG PO TABS: 10.0000 mg | ORAL_TABLET | Freq: Every day | ORAL | 3 refills | Status: DC

## 2016-07-14 NOTE — Progress Notes (Signed)
Patient is here for FU HTN  Patient denies pain at this time.  Patient complains of a slight odor in her vaginal area.  Patient has taken medication today. Patient has not eaten today.

## 2016-07-14 NOTE — Patient Instructions (Signed)
DASH Eating Plan DASH stands for "Dietary Approaches to Stop Hypertension." The DASH eating plan is a healthy eating plan that has been shown to reduce high blood pressure (hypertension). Additional health benefits may include reducing the risk of type 2 diabetes mellitus, heart disease, and stroke. The DASH eating plan may also help with weight loss. What do I need to know about the DASH eating plan? For the DASH eating plan, you will follow these general guidelines:  Choose foods with less than 150 milligrams of sodium per serving (as listed on the food label).  Use salt-free seasonings or herbs instead of table salt or sea salt.  Check with your health care provider or pharmacist before using salt substitutes.  Eat lower-sodium products. These are often labeled as "low-sodium" or "no salt added."  Eat fresh foods. Avoid eating a lot of canned foods.  Eat more vegetables, fruits, and low-fat dairy products.  Choose whole grains. Look for the word "whole" as the first word in the ingredient list.  Choose fish and skinless chicken or turkey more often than red meat. Limit fish, poultry, and meat to 6 oz (170 g) each day.  Limit sweets, desserts, sugars, and sugary drinks.  Choose heart-healthy fats.  Eat more home-cooked food and less restaurant, buffet, and fast food.  Limit fried foods.  Do not fry foods. Cook foods using methods such as baking, boiling, grilling, and broiling instead.  When eating at a restaurant, ask that your food be prepared with less salt, or no salt if possible. What foods can I eat? Seek help from a dietitian for individual calorie needs. Grains  Whole grain or whole wheat bread. Brown rice. Whole grain or whole wheat pasta. Quinoa, bulgur, and whole grain cereals. Low-sodium cereals. Corn or whole wheat flour tortillas. Whole grain cornbread. Whole grain crackers. Low-sodium crackers. Vegetables  Fresh or frozen vegetables (raw, steamed, roasted, or  grilled). Low-sodium or reduced-sodium tomato and vegetable juices. Low-sodium or reduced-sodium tomato sauce and paste. Low-sodium or reduced-sodium canned vegetables. Fruits  All fresh, canned (in natural juice), or frozen fruits. Meat and Other Protein Products  Ground beef (85% or leaner), grass-fed beef, or beef trimmed of fat. Skinless chicken or turkey. Ground chicken or turkey. Pork trimmed of fat. All fish and seafood. Eggs. Dried beans, peas, or lentils. Unsalted nuts and seeds. Unsalted canned beans. Dairy  Low-fat dairy products, such as skim or 1% milk, 2% or reduced-fat cheeses, low-fat ricotta or cottage cheese, or plain low-fat yogurt. Low-sodium or reduced-sodium cheeses. Fats and Oils  Tub margarines without trans fats. Light or reduced-fat mayonnaise and salad dressings (reduced sodium). Avocado. Safflower, olive, or canola oils. Natural peanut or almond butter. Other  Unsalted popcorn and pretzels. The items listed above may not be a complete list of recommended foods or beverages. Contact your dietitian for more options.  What foods are not recommended? Grains  White bread. White pasta. White rice. Refined cornbread. Bagels and croissants. Crackers that contain trans fat. Vegetables  Creamed or fried vegetables. Vegetables in a cheese sauce. Regular canned vegetables. Regular canned tomato sauce and paste. Regular tomato and vegetable juices. Fruits  Canned fruit in light or heavy syrup. Fruit juice. Meat and Other Protein Products  Fatty cuts of meat. Ribs, chicken wings, bacon, sausage, bologna, salami, chitterlings, fatback, hot dogs, bratwurst, and packaged luncheon meats. Salted nuts and seeds. Canned beans with salt. Dairy  Whole or 2% milk, cream, half-and-half, and cream cheese. Whole-fat or sweetened yogurt. Full-fat cheeses   or blue cheese. Nondairy creamers and whipped toppings. Processed cheese, cheese spreads, or cheese curds. Condiments  Onion and garlic  salt, seasoned salt, table salt, and sea salt. Canned and packaged gravies. Worcestershire sauce. Tartar sauce. Barbecue sauce. Teriyaki sauce. Soy sauce, including reduced sodium. Steak sauce. Fish sauce. Oyster sauce. Cocktail sauce. Horseradish. Ketchup and mustard. Meat flavorings and tenderizers. Bouillon cubes. Hot sauce. Tabasco sauce. Marinades. Taco seasonings. Relishes. Fats and Oils  Butter, stick margarine, lard, shortening, ghee, and bacon fat. Coconut, palm kernel, or palm oils. Regular salad dressings. Other  Pickles and olives. Salted popcorn and pretzels. The items listed above may not be a complete list of foods and beverages to avoid. Contact your dietitian for more information.  Where can I find more information? National Heart, Lung, and Blood Institute: travelstabloid.com This information is not intended to replace advice given to you by your health care provider. Make sure you discuss any questions you have with your health care provider. Document Released: 07/21/2011 Document Revised: 01/07/2016 Document Reviewed: 06/05/2013 Elsevier Interactive Patient Education  2017 [employer] Hypertension Hypertension, commonly called high blood pressure, is when the force of blood pumping through your arteries is too strong. Your arteries are the blood vessels that carry blood from your heart throughout your body. A blood pressure reading consists of a higher number over a lower number, such as 110/72. The higher number (systolic) is the pressure inside your arteries when your heart pumps. The lower number (diastolic) is the pressure inside your arteries when your heart relaxes. Ideally you want your blood pressure below 120/80. Hypertension forces your heart to work harder to pump blood. Your arteries may become narrow or stiff. Having untreated or uncontrolled hypertension can cause heart attack, stroke, kidney disease, and other problems. What  increases the risk? Some risk factors for high blood pressure are controllable. Others are not. Risk factors you cannot control include:  Race. You may be at higher risk if you are African American.  Age. Risk increases with age.  Gender. Men are at higher risk than women before age 80 years. After age 60, women are at higher risk than men. Risk factors you can control include:  Not getting enough exercise or physical activity.  Being overweight.  Getting too much fat, sugar, calories, or salt in your diet.  Drinking too much alcohol. What are the signs or symptoms? Hypertension does not usually cause signs or symptoms. Extremely high blood pressure (hypertensive crisis) may cause headache, anxiety, shortness of breath, and nosebleed. How is this diagnosed? To check if you have hypertension, your health care provider will measure your blood pressure while you are seated, with your arm held at the level of your heart. It should be measured at least twice using the same arm. Certain conditions can cause a difference in blood pressure between your right and left arms. A blood pressure reading that is higher than normal on one occasion does not mean that you need treatment. If it is not clear whether you have high blood pressure, you may be asked to return on a different day to have your blood pressure checked again. Or, you may be asked to monitor your blood pressure at home for 1 or more weeks. How is this treated? Treating high blood pressure includes making lifestyle changes and possibly taking medicine. Living a healthy lifestyle can help lower high blood pressure. You may need to change some of your habits. Lifestyle changes may include:  Following the DASH diet. This  diet is high in fruits, vegetables, and whole grains. It is low in salt, red meat, and added sugars.  Keep your sodium intake below 2,300 mg per day.  Getting at least 30-45 minutes of aerobic exercise at least 4 times  per week.  Losing weight if necessary.  Not smoking.  Limiting alcoholic beverages.  Learning ways to reduce stress. Your health care provider may prescribe medicine if lifestyle changes are not enough to get your blood pressure under control, and if one of the following is true:  You are 24-39 years of age and your systolic blood pressure is above 140.  You are 66 years of age or older, and your systolic blood pressure is above 150.  Your diastolic blood pressure is above 90.  You have diabetes, and your systolic blood pressure is over 226 or your diastolic blood pressure is over 90.  You have kidney disease and your blood pressure is above 140/90.  You have heart disease and your blood pressure is above 140/90. Your personal target blood pressure may vary depending on your medical conditions, your age, and other factors. Follow these instructions at home:  Have your blood pressure rechecked as directed by your health care provider.  Take medicines only as directed by your health care provider. Follow the directions carefully. Blood pressure medicines must be taken as prescribed. The medicine does not work as well when you skip doses. Skipping doses also puts you at risk for problems.  Do not smoke.  Monitor your blood pressure at home as directed by your health care provider. Contact a health care provider if:  You think you are having a reaction to medicines taken.  You have recurrent headaches or feel dizzy.  You have swelling in your ankles.  You have trouble with your vision. Get help right away if:  You develop a severe headache or confusion.  You have unusual weakness, numbness, or feel faint.  You have severe chest or abdominal pain.  You vomit repeatedly.  You have trouble breathing. This information is not intended to replace advice given to you by your health care provider. Make sure you discuss any questions you have with your health care  provider. Document Released: 08/01/2005 Document Revised: 01/07/2016 Document Reviewed: 05/24/2013 Elsevier Interactive Patient Education  2017 [employer].

## 2016-07-14 NOTE — Progress Notes (Signed)
Michelle Sosa, is a 70 y.o. female  [id number]  [id number]  DOB - 28-Nov-1945  Chief Complaint  Patient presents with  . Hypertension      Subjective:   Michelle Sosa is a 70 y.o. female with history of hypertension and remote CVA here today for a follow up visit. Patient is complaining of some dysuria and vaginal odor but no vaginal discharge. She thinks she has UTI but wants to be sure before taking antibiotics. She is adherent with her medications, reprots no side effect. No other complaint today. BP is controlled. Patient has No headache, No chest pain, No abdominal pain - No Nausea, No new weakness tingling or numbness, No Cough - SOB.  Problem  Dysuria    ALLERGIES: Allergies  Allergen Reactions  . Codeine   . Penicillins   . Sulfa Antibiotics     PAST MEDICAL HISTORY: Past Medical History:  Diagnosis Date  . Hypertension   . Stroke [facility])     MEDICATIONS AT HOME: Prior to Admission medications   Medication Sig Start Date End Date Taking? Authorizing Provider  amLODipine (NORVASC) 10 MG tablet Take 1 tablet (10 mg total) by mouth daily. 07/14/16  Yes Tresa Garter, MD  aspirin 81 MG EC tablet Take 1 tablet (81 mg total) by mouth daily. Swallow whole. 02/13/13  Yes Reyne Dumas, MD  hydrochlorothiazide (MICROZIDE) 12.5 MG capsule Take 1 capsule (12.5 mg total) by mouth daily. 07/14/16  Yes Dari Carpenito Essie Christine, MD  irbesartan (AVAPRO) 300 MG tablet Take 1 tablet (300 mg total) by mouth at bedtime. 07/14/16  Yes Tresa Garter, MD  multivitamin-iron-minerals-folic acid (CENTRUM) chewable tablet Chew 1 tablet by mouth daily.   Yes Historical Provider, MD  thiamine (VITAMIN B-1) 100 MG tablet Take 100 mg by mouth daily.   Yes Historical Provider, MD  vitamin B-12 (CYANOCOBALAMIN) 100 MCG tablet Take 100 mcg by mouth daily.   Yes Historical Provider, MD  diclofenac sodium (VOLTAREN) 1 % GEL Apply 2 g topically 2 (two) times daily. Patient not taking:  Reported on 07/14/2016 01/13/14   Lance Bosch, NP    Objective:   Vitals:   07/14/16 1233  BP: 129/78  Pulse: 80  Resp: 18  Temp: 98.7 F (37.1 C)  TempSrc: Oral  SpO2: 96%  Weight: 163 lb (73.9 kg)  Height: 5\' 3"  (1.6 m)   Exam General appearance : Awake, alert, not in any distress. Speech Clear. Not toxic looking HEENT: Atraumatic and Normocephalic, pupils equally reactive to light and accomodation Neck: Supple, no JVD. No cervical lymphadenopathy.  Chest: Good air entry bilaterally, no added sounds  CVS: S1 S2 regular, no murmurs.  Abdomen: Bowel sounds present, Non tender and not distended with no gaurding, rigidity or rebound. Extremities: B/L Lower Ext shows no edema, both legs are warm to touch Neurology: Awake alert, and oriented X 3, CN II-XII intact, Non focal Skin: No Rash  Data Review Lab Results  Component Value Date   HGBA1C 5.4 01/18/2010   HGBA1C  05/13/2007    5.3 (NOTE)   The ADA recommends the following therapeutic goals for glycemic   control related to Hgb A1C measurement:   Goal of Therapy:   < 7.0% Hgb A1C   Action Suggested:  > 8.0% Hgb A1C   Ref:  Diabetes Care, 22, Suppl. 1, 1999    Assessment & Plan   1. Essential hypertension, benign  - hydrochlorothiazide (MICROZIDE) 12.5 MG capsule; Take 1 capsule (12.5 mg total)  by mouth daily.  Dispense: 90 capsule; Refill: 3 - irbesartan (AVAPRO) 300 MG tablet; Take 1 tablet (300 mg total) by mouth at bedtime.  Dispense: 90 tablet; Refill: 3 - amLODipine (NORVASC) 10 MG tablet; Take 1 tablet (10 mg total) by mouth daily.  Dispense: 90 tablet; Refill: 3  2. Dyslipidemia  To address this please limit saturated fat to no more than 7% of your calories, limit cholesterol to 200 mg/day, increase fiber and exercise as tolerated. If needed we may add another cholesterol lowering medication to your regimen.   3. Dysuria  - Urinalysis with Culture Reflex  Patient have been counseled extensively about  nutrition and exercise. Other issues discussed during this visit include: low cholesterol diet, weight control and daily exercise, importance of adherence with medications and regular follow-up. We also discussed long term complications of uncontrolled hypertension.   Return in about 6 months (around 01/11/2017) for Annual Physical, Follow up HTN.  The patient was given clear instructions to go to ER or return to medical center if symptoms don't improve, worsen or new problems develop. The patient verbalized understanding. The patient was told to call to get lab results if they haven't heard anything in the next week.   This note has been created with Surveyor, quantity. Any transcriptional errors are unintentional.    Angelica Chessman, MD, [place], [place], [place], [place], [place]   07/14/2016, 12:42 PM

## 2016-07-15 LAB — URINALYSIS W MICROSCOPIC + REFLEX CULTURE
BACTERIA UA: NONE SEEN [HPF]
BILIRUBIN URINE: NEGATIVE
Casts: NONE SEEN [LPF]
Crystals: NONE SEEN [HPF]
GLUCOSE, UA: NEGATIVE
Hgb urine dipstick: NEGATIVE
Ketones, ur: NEGATIVE
NITRITE: NEGATIVE
PH: 6 (ref 5.0–8.0)
PROTEIN: NEGATIVE
RBC / HPF: NONE SEEN RBC/HPF (ref <=2)
Specific Gravity, Urine: 1.009 (ref 1.001–1.035)
Yeast: NONE SEEN [HPF]

## 2016-07-17 LAB — URINE CULTURE

## 2016-07-19 ENCOUNTER — Other Ambulatory Visit: Payer: Self-pay | Admitting: Internal Medicine

## 2016-07-19 ENCOUNTER — Telehealth: Payer: Self-pay | Admitting: *Deleted

## 2016-07-19 MED ORDER — SULFAMETHOXAZOLE-TRIMETHOPRIM 800-160 MG PO TABS: 1.0000 | ORAL_TABLET | Freq: Two times a day (BID) | ORAL | 0 refills | Status: AC

## 2016-07-19 MED ORDER — DIPHENHYDRAMINE HCL 25 MG PO TABS: 25.0000 mg | ORAL_TABLET | Freq: Two times a day (BID) | ORAL | 0 refills | Status: DC

## 2016-07-19 NOTE — Progress Notes (Signed)
Patient states she was 7 and a teenager when she had the reaction. Patients mother passed a year ago so she is unable to ask. Patient states she believes she became itchy and Nauseous.

## 2016-07-19 NOTE — Telephone Encounter (Signed)
Patient verified DOB Patient is aware of UTI being present in urine. MA shared with patient the bacteria is only sensitive to treatments she has allergies too. Patient states reactions happened while she was a young child and she believes the reaction was itching and N/V. MA informed patient of information being routed to the PCP for advise. MA will contact the patient with the next step. No further questions at this time.

## 2016-07-19 NOTE — Telephone Encounter (Signed)
-----   Message from Tresa Garter, MD sent at 07/18/2016  4:09 PM EST ----- Please inform patient that her urine culture is positive for UTI. The bacteria is sensitive only to sulphur medication (bactrim) and penicillin (amoxicillin and Augmentin). But patient have allergy. Ask the specific symptoms to these medications, if itching only, we can prescribe the antibiotics plus phenergan or benadryl. Otherwise, the only other option will be injections.

## 2016-07-19 NOTE — Telephone Encounter (Signed)
E-Prescribed to the pharmacy [facility])

## 2016-07-19 NOTE — Telephone Encounter (Signed)
Patient verified DOB Patient was given three treatment options: 1. Bactrim and benadryl for itching reaction 2. Bactrim and Phenergan for N/V 3. Consecutive days of injections in office.  Patient states she would like to try the Bactrim with Benadryl 30 minutes prior to taking. Patient advised to watch for difficult breathing and throat swelling. Patient is to contact ED as soon as possible if symptoms persist.

## 2017-01-18 ENCOUNTER — Ambulatory Visit: Payer: PPO | Attending: Internal Medicine | Admitting: Internal Medicine

## 2017-01-18 VITALS — BP 145/80 | HR 67 | Temp 98.0°F | Resp 18 | Ht 63.0 in | Wt 163.0 lb

## 2017-01-18 DIAGNOSIS — E785 Hyperlipidemia, unspecified: Secondary | ICD-10-CM | POA: Diagnosis not present

## 2017-01-18 DIAGNOSIS — Z79899 Other long term (current) drug therapy: Secondary | ICD-10-CM | POA: Insufficient documentation

## 2017-01-18 DIAGNOSIS — I10 Essential (primary) hypertension: Secondary | ICD-10-CM

## 2017-01-18 DIAGNOSIS — Z131 Encounter for screening for diabetes mellitus: Secondary | ICD-10-CM

## 2017-01-18 DIAGNOSIS — Z7982 Long term (current) use of aspirin: Secondary | ICD-10-CM | POA: Diagnosis not present

## 2017-01-18 LAB — POCT GLYCOSYLATED HEMOGLOBIN (HGB A1C): Hemoglobin A1C: 4.9

## 2017-01-18 NOTE — Patient Instructions (Signed)
Dyslipidemia Dyslipidemia is an imbalance of waxy, fat-like substances (lipids) in the blood. The body needs lipids in small amounts. Dyslipidemia often involves a high level of cholesterol or triglycerides, which are types of lipids. Common forms of dyslipidemia include:  High levels of bad cholesterol (LDL cholesterol). LDL is the type of cholesterol that causes fatty deposits (plaques) to build up in the blood vessels that carry blood away from your heart (arteries).  Low levels of good cholesterol (HDL cholesterol). HDL cholesterol is the type of cholesterol that protects against heart disease. High levels of HDL remove the LDL buildup from arteries.  High levels of triglycerides. Triglycerides are a fatty substance in the blood that is linked to a buildup of plaques in the arteries.  You can develop dyslipidemia because of the genes you are born with (primary dyslipidemia) or changes that occur during your life (secondary dyslipidemia), or as a side effect of certain medical treatments. What are the causes? Primary dyslipidemia is caused by changes (mutations) in genes that are passed down through families (inherited). These mutations cause several types of dyslipidemia. Mutations can result in disorders that make the body produce too much LDL cholesterol or triglycerides, or not enough HDL cholesterol. These disorders may lead to heart disease, arterial disease, or stroke at an early age. Causes of secondary dyslipidemia include certain lifestyle choices and diseases that lead to dyslipidemia, such as:  Eating a diet that is high in animal fat.  Not getting enough activity or exercise (having a sedentary lifestyle).  Having diabetes, kidney disease, liver disease, or thyroid disease.  Drinking large amounts of alcohol.  Using certain types of drugs.  What increases the risk? You may be at greater risk for dyslipidemia if you are an older man or if you are a woman who has gone through  menopause. Other risk factors include:  Having a family history of dyslipidemia.  Taking certain medicines, including birth control pills, steroids, some diuretics, beta-blockers, and some medicines forHIV.  Smoking cigarettes.  Eating a high-fat diet.  Drinking large amounts of alcohol.  Having certain medical conditions such as diabetes, polycystic ovary syndrome (PCOS), pregnancy, kidney disease, liver disease, or hypothyroidism.  Not exercising regularly.  Being overweight or obese with too much belly fat.  What are the signs or symptoms? Dyslipidemia does not usually cause any symptoms. Very high lipid levels can cause fatty bumps under the skin (xanthomas) or a white or gray ring around the black center (pupil) of the eye. Very high triglyceride levels can cause inflammation of the pancreas (pancreatitis). How is this diagnosed? Your health care provider may diagnose dyslipidemia based on a routine blood test (fasting blood test). Because most people do not have symptoms of the condition, this blood testing (lipid profile) is done on adults age 20 and older and is repeated every 5 years. This test checks:  Total cholesterol. This is a measure of the total amount of cholesterol in your blood, including LDL cholesterol, HDL cholesterol, and triglycerides. A healthy number is below 200.  LDL cholesterol. The target number for LDL cholesterol is different for each person, depending on individual risk factors. For most people, a number below 100 is healthy. Ask your health care provider what your LDL cholesterol number should be.  HDL cholesterol. An HDL level of 60 or higher is best because it helps to protect against heart disease. A number below 40 for men or below 50 for women increases the risk for heart disease.  Triglycerides. A   healthy triglyceride number is below 150.  If your lipid profile is abnormal, your health care provider may do other blood tests to get more  information about your condition. How is this treated? Treatment depends on the type of dyslipidemia that you have and your other risk factors for heart disease and stroke. Your health care provider will have a target range for your lipid levels based on this information. For many people, treatment starts with lifestyle changes, such as diet and exercise. Your health care provider may recommend that you:  Get regular exercise.  Make changes to your diet.  Quit smoking if you smoke.  If diet changes and exercise do not help you reach your goals, your health care provider may also prescribe medicine to lower lipids. The most commonly prescribed type of medicine lowers your LDL cholesterol (statin drug). If you have a high triglyceride level, your provider may prescribe another type of drug (fibrate) or an omega-3 fish oil supplement, or both. Follow these instructions at home:  Take over-the-counter and prescription medicines only as told by your health care provider. This includes supplements.  Get regular exercise. Start an aerobic exercise and strength training program as told by your health care provider. Ask your health care provider what activities are safe for you. Your health care provider may recommend: ? 30 minutes of aerobic activity 4-6 days a week. Brisk walking is an example of aerobic activity. ? Strength training 2 days a week.  Eat a healthy diet as told by your health care provider. This can help you reach and maintain a healthy weight, lower your LDL cholesterol, and raise your HDL cholesterol. It may help to work with a diet and nutrition specialist (dietitian) to make a plan that is right for you. Your dietitian or health care provider may recommend: ? Limiting your calories, if you are overweight. ? Eating more fruits, vegetables, whole grains, fish, and lean meats. ? Limiting saturated fat, trans fat, and cholesterol.  Follow instructions from your health care provider  or dietitian about eating or drinking restrictions.  Limit alcohol intake to no more than one drink per day for nonpregnant women and two drinks per day for men. One drink equals 12 oz of beer, 5 oz of wine, or 1 oz of hard liquor.  Do not use any products that contain nicotine or tobacco, such as cigarettes and e-cigarettes. If you need help quitting, ask your health care provider.  Keep all follow-up visits as told by your health care provider. This is important. Contact a health care provider if:  You are having trouble sticking to your exercise or diet plan.  You are struggling to quit smoking or control your use of alcohol. Summary  Dyslipidemia is an imbalance of waxy, fat-like substances (lipids) in the blood. The body needs lipids in small amounts. Dyslipidemia often involves a high level of cholesterol or triglycerides, which are types of lipids.  Treatment depends on the type of dyslipidemia that you have and your other risk factors for heart disease and stroke.  For many people, treatment starts with lifestyle changes, such as diet and exercise. Your health care provider may also prescribe medicine to lower lipids. This information is not intended to replace advice given to you by your health care provider. Make sure you discuss any questions you have with your health care provider. Document Released: 08/06/2013 Document Revised: 03/28/2016 Document Reviewed: 03/28/2016 Elsevier Interactive Patient Education  2018 [employer] Hypertension Hypertension, commonly called high blood   pressure, is when the force of blood pumping through the arteries is too strong. The arteries are the blood vessels that carry blood from the heart throughout the body. Hypertension forces the heart to work harder to pump blood and may cause arteries to become narrow or stiff. Having untreated or uncontrolled hypertension can cause heart attacks, strokes, kidney disease, and other problems. A blood  pressure reading consists of a higher number over a lower number. Ideally, your blood pressure should be below 120/80. The first ("top") number is called the systolic pressure. It is a measure of the pressure in your arteries as your heart beats. The second ("bottom") number is called the diastolic pressure. It is a measure of the pressure in your arteries as the heart relaxes. What are the causes? The cause of this condition is not known. What increases the risk? Some risk factors for high blood pressure are under your control. Others are not. Factors you can change  Smoking.  Having type 2 diabetes mellitus, high cholesterol, or both.  Not getting enough exercise or physical activity.  Being overweight.  Having too much fat, sugar, calories, or salt (sodium) in your diet.  Drinking too much alcohol. Factors that are difficult or impossible to change  Having chronic kidney disease.  Having a family history of high blood pressure.  Age. Risk increases with age.  Race. You may be at higher risk if you are African-American.  Gender. Men are at higher risk than women before age 45. After age 65, women are at higher risk than men.  Having obstructive sleep apnea.  Stress. What are the signs or symptoms? Extremely high blood pressure (hypertensive crisis) may cause:  Headache.  Anxiety.  Shortness of breath.  Nosebleed.  Nausea and vomiting.  Severe chest pain.  Jerky movements you cannot control (seizures).  How is this diagnosed? This condition is diagnosed by measuring your blood pressure while you are seated, with your arm resting on a surface. The cuff of the blood pressure monitor will be placed directly against the skin of your upper arm at the level of your heart. It should be measured at least twice using the same arm. Certain conditions can cause a difference in blood pressure between your right and left arms. Certain factors can cause blood pressure readings  to be lower or higher than normal (elevated) for a short period of time:  When your blood pressure is higher when you are in a health care provider's office than when you are at home, this is called white coat hypertension. Most people with this condition do not need medicines.  When your blood pressure is higher at home than when you are in a health care provider's office, this is called masked hypertension. Most people with this condition may need medicines to control blood pressure.  If you have a high blood pressure reading during one visit or you have normal blood pressure with other risk factors:  You may be asked to return on a different day to have your blood pressure checked again.  You may be asked to monitor your blood pressure at home for 1 week or longer.  If you are diagnosed with hypertension, you may have other blood or imaging tests to help your health care provider understand your overall risk for other conditions. How is this treated? This condition is treated by making healthy lifestyle changes, such as eating healthy foods, exercising more, and reducing your alcohol intake. Your health care provider may prescribe   medicine if lifestyle changes are not enough to get your blood pressure under control, and if:  Your systolic blood pressure is above 130.  Your diastolic blood pressure is above 80.  Your personal target blood pressure may vary depending on your medical conditions, your age, and other factors. Follow these instructions at home: Eating and drinking  Eat a diet that is high in fiber and potassium, and low in sodium, added sugar, and fat. An example eating plan is called the DASH (Dietary Approaches to Stop Hypertension) diet. To eat this way: ? Eat plenty of fresh fruits and vegetables. Try to fill half of your plate at each meal with fruits and vegetables. ? Eat whole grains, such as whole wheat pasta, brown rice, or whole grain bread. Fill about one quarter of  your plate with whole grains. ? Eat or drink low-fat dairy products, such as skim milk or low-fat yogurt. ? Avoid fatty cuts of meat, processed or cured meats, and poultry with skin. Fill about one quarter of your plate with lean proteins, such as fish, chicken without skin, beans, eggs, and tofu. ? Avoid premade and processed foods. These tend to be higher in sodium, added sugar, and fat.  Reduce your daily sodium intake. Most people with hypertension should eat less than 1,500 mg of sodium a day.  Limit alcohol intake to no more than 1 drink a day for nonpregnant women and 2 drinks a day for men. One drink equals 12 oz of beer, 5 oz of wine, or 1 oz of hard liquor. Lifestyle  Work with your health care provider to maintain a healthy body weight or to lose weight. Ask what an ideal weight is for you.  Get at least 30 minutes of exercise that causes your heart to beat faster (aerobic exercise) most days of the week. Activities may include walking, swimming, or biking.  Include exercise to strengthen your muscles (resistance exercise), such as pilates or lifting weights, as part of your weekly exercise routine. Try to do these types of exercises for 30 minutes at least 3 days a week.  Do not use any products that contain nicotine or tobacco, such as cigarettes and e-cigarettes. If you need help quitting, ask your health care provider.  Monitor your blood pressure at home as told by your health care provider.  Keep all follow-up visits as told by your health care provider. This is important. Medicines  Take over-the-counter and prescription medicines only as told by your health care provider. Follow directions carefully. Blood pressure medicines must be taken as prescribed.  Do not skip doses of blood pressure medicine. Doing this puts you at risk for problems and can make the medicine less effective.  Ask your health care provider about side effects or reactions to medicines that you  should watch for. Contact a health care provider if:  You think you are having a reaction to a medicine you are taking.  You have headaches that keep coming back (recurring).  You feel dizzy.  You have swelling in your ankles.  You have trouble with your vision. Get help right away if:  You develop a severe headache or confusion.  You have unusual weakness or numbness.  You feel faint.  You have severe pain in your chest or abdomen.  You vomit repeatedly.  You have trouble breathing. Summary  Hypertension is when the force of blood pumping through your arteries is too strong. If this condition is not controlled, it may put you   at risk for serious complications.  Your personal target blood pressure may vary depending on your medical conditions, your age, and other factors. For most people, a normal blood pressure is less than 120/80.  Hypertension is treated with lifestyle changes, medicines, or a combination of both. Lifestyle changes include weight loss, eating a healthy, low-sodium diet, exercising more, and limiting alcohol. This information is not intended to replace advice given to you by your health care provider. Make sure you discuss any questions you have with your health care provider. Document Released: 08/01/2005 Document Revised: 06/29/2016 Document Reviewed: 06/29/2016 Elsevier Interactive Patient Education  2018 Elsevier Inc.  

## 2017-01-18 NOTE — Progress Notes (Signed)
Michelle Sosa, is a 71 y.o. female  DGU:[id number]  ZDG:[id number]  DOB - [birth year]  Chief Complaint  Patient presents with  . Hypertension       Subjective:   Michelle Sosa is a 71 y.o. female with history of hypertension and remote CVA here today for a routine follow up visit. She has not been seen since Nov 2017, she says she feels fine. She has no complaint today. She has her medications, no need for refill today. Needs dental work. Patient has No headache, No chest pain, No abdominal pain - No Nausea, No new weakness tingling or numbness, No Cough - SOB.  No problems updated.  ALLERGIES: Allergies  Allergen Reactions  . Codeine   . Penicillins   . Sulfa Antibiotics     PAST MEDICAL HISTORY: Past Medical History:  Diagnosis Date  . Hypertension   . Stroke [facility])     MEDICATIONS AT HOME: Prior to Admission medications   Medication Sig Start Date End Date Taking? Authorizing Provider  amLODipine (NORVASC) 10 MG tablet Take 1 tablet (10 mg total) by mouth daily. 07/14/16  Yes Tresa Garter, MD  aspirin 81 MG EC tablet Take 1 tablet (81 mg total) by mouth daily. Swallow whole. 02/13/13  Yes Reyne Dumas, MD  diclofenac sodium (VOLTAREN) 1 % GEL Apply 2 g topically 2 (two) times daily. 01/13/14  Yes Lance Bosch, NP  diphenhydrAMINE (BENADRYL) 25 MG tablet Take 1 tablet (25 mg total) by mouth 2 (two) times daily. Take 1 tab 30 min before taking Bactrim 07/19/16  Yes Mikaila Grunert E, MD  hydrochlorothiazide (MICROZIDE) 12.5 MG capsule Take 1 capsule (12.5 mg total) by mouth daily. 07/14/16  Yes Jaretssi Kraker, Marlena Clipper, MD  irbesartan (AVAPRO) 300 MG tablet Take 1 tablet (300 mg total) by mouth at bedtime. 07/14/16  Yes Tresa Garter, MD  multivitamin-iron-minerals-folic acid (CENTRUM) chewable tablet Chew 1 tablet by mouth daily.   Yes [provider]  thiamine (VITAMIN B-1) 100 MG tablet Take 100 mg by mouth daily.   Yes [provider]  vitamin B-12 (CYANOCOBALAMIN) 100 MCG tablet Take 100 mcg by mouth daily.   Yes [provider]    Objective:   Vitals:   01/18/17 1111  BP: (!) 145/80  Pulse: 67  Resp: 18  Temp: 98 F (36.7 C)  TempSrc: Oral  SpO2: 98%  Weight: 163 lb (73.9 kg)  Height: '5\' 3"'  (1.6 m)   Exam General appearance : Awake, alert, not in any distress. Speech Clear. Not toxic looking, poor dentition HEENT: Atraumatic and Normocephalic, pupils equally reactive to light and accomodation Neck: Supple, no JVD. No cervical lymphadenopathy.  Chest: Good air entry bilaterally, no added sounds  CVS: S1 S2 regular, no murmurs.  Abdomen: Bowel sounds present, Non tender and not distended with no gaurding, rigidity or rebound. Extremities: B/L Lower Ext shows no edema, both legs are warm to touch Neurology: Awake alert, and oriented X 3, CN II-XII intact, Non focal Skin: No Rash  Data Review Lab Results  Component Value Date   HGBA1C 5.4 01/18/2010   HGBA1C  05/13/2007    5.3 (NOTE)   The ADA recommends the following therapeutic goals for glycemic   control related to Hgb A1C measurement:   Goal of Therapy:   < 7.0% Hgb A1C   Action Suggested:  > 8.0% Hgb A1C   Ref:  Diabetes Care, 22, Suppl. 1, 1999    Assessment & Plan  1. Essential hypertension, benign  - CBC with Differential/Platelet - CMP14+EGFR - TSH  2. Dyslipidemia  - Lipid panel  Patient have been counseled extensively about nutrition and exercise. Other issues discussed during this visit include: low cholesterol diet, weight control and daily exercise, importance of adherence with medications and regular follow-up. We also discussed long term complications of uncontrolled hypertension.   Return in about 6 months (around 07/20/2017) for Follow up HTN, Follow up Pain and comorbidities.  The patient was given clear instructions to go to ER or return to medical center if symptoms don't improve, worsen or new problems develop.  The patient verbalized understanding. The patient was told to call to get lab results if they haven't heard anything in the next week.   This note has been created with Surveyor, quantity. Any transcriptional errors are unintentional.    Angelica Chessman, MD, [place], Karilyn Cota, [place], [place]   01/18/2017, 11:52 AM

## 2017-01-19 LAB — CBC WITH DIFFERENTIAL/PLATELET
BASOS ABS: 0 10*3/uL (ref 0.0–0.2)
Basos: 0 %
EOS (ABSOLUTE): 0.1 10*3/uL (ref 0.0–0.4)
Eos: 2 %
Hematocrit: 42 % (ref 34.0–46.6)
Hemoglobin: 14.1 g/dL (ref 11.1–15.9)
Immature Grans (Abs): 0 10*3/uL (ref 0.0–0.1)
Immature Granulocytes: 0 %
LYMPHS ABS: 2 10*3/uL (ref 0.7–3.1)
LYMPHS: 24 %
MCH: 31.3 pg (ref 26.6–33.0)
MCHC: 33.6 g/dL (ref 31.5–35.7)
MCV: 93 fL (ref 79–97)
Monocytes Absolute: 0.5 10*3/uL (ref 0.1–0.9)
Monocytes: 7 %
NEUTROS ABS: 5.5 10*3/uL (ref 1.4–7.0)
Neutrophils: 67 %
PLATELETS: 371 10*3/uL (ref 150–379)
RBC: 4.5 x10E6/uL (ref 3.77–5.28)
RDW: 13.2 % (ref 12.3–15.4)
WBC: 8.1 10*3/uL (ref 3.4–10.8)

## 2017-01-19 LAB — CMP14+EGFR
ALK PHOS: 122 IU/L — AB (ref 39–117)
ALT: 19 IU/L (ref 0–32)
AST: 21 IU/L (ref 0–40)
Albumin/Globulin Ratio: 1.3 (ref 1.2–2.2)
Albumin: 4.6 g/dL (ref 3.6–4.8)
BILIRUBIN TOTAL: 0.3 mg/dL (ref 0.0–1.2)
BUN/Creatinine Ratio: 17 (ref 12–28)
BUN: 10 mg/dL (ref 8–27)
CHLORIDE: 98 mmol/L (ref 96–106)
CO2: 24 mmol/L (ref 18–29)
Calcium: 10.3 mg/dL (ref 8.7–10.3)
Creatinine, Ser: 0.58 mg/dL (ref 0.57–1.00)
GFR calc Af Amer: 110 mL/min/{1.73_m2} (ref >59)
GFR calc non Af Amer: 95 mL/min/{1.73_m2} (ref >59)
GLUCOSE: 97 mg/dL (ref 65–99)
Globulin, Total: 3.5 g/dL (ref 1.5–4.5)
Potassium: 4 mmol/L (ref 3.5–5.2)
Sodium: 138 mmol/L (ref 134–144)
Total Protein: 8.1 g/dL (ref 6.0–8.5)

## 2017-01-19 LAB — LIPID PANEL
CHOLESTEROL TOTAL: 200 mg/dL — AB (ref 100–199)
Chol/HDL Ratio: 3.8 ratio (ref 0.0–4.4)
HDL: 52 mg/dL (ref >39)
LDL Calculated: 120 mg/dL — ABNORMAL HIGH (ref 0–99)
Triglycerides: 140 mg/dL (ref 0–149)
VLDL CHOLESTEROL CAL: 28 mg/dL (ref 5–40)

## 2017-01-19 LAB — TSH: TSH: 4.87 u[IU]/mL — ABNORMAL HIGH (ref 0.450–4.500)

## 2017-02-09 ENCOUNTER — Telehealth: Payer: Self-pay | Admitting: *Deleted

## 2017-02-09 NOTE — Telephone Encounter (Signed)
Patient verified DOB Patient is aware of cholesterol being elevated and medication being prescribed to the pharmacy. Patient is advised to limit saturated fat intake and increase exercise and fiber. Patient is aware of a recheck being completed in 3-6 months. Patient had no further questions at this time.

## 2017-02-09 NOTE — Telephone Encounter (Signed)
Medical Assistant left message on patient's home and cell voicemail. Voicemail states to give a call back to Aslin Farinas with [facility] at [phone].  

## 2017-02-09 NOTE — Telephone Encounter (Signed)
-----   Message from Tresa Garter, MD sent at 01/23/2017 10:37 AM EDT ----- Please inform patient that her lab results are mostly normal except for high cholesterol and slightly low thyroid function. To address this please limit saturated fat to no more than 7% of your calories, limit cholesterol to 200 mg/day, increase fiber and exercise as tolerated. If needed we may add a cholesterol lowering medication to your regimen. We will monitor your thyroid function, if it remains the same or lower when we repeat in 3-6 months, we may start you on thyroid medication.

## 2017-04-24 ENCOUNTER — Ambulatory Visit: Payer: PPO | Attending: Internal Medicine | Admitting: Internal Medicine

## 2017-04-24 ENCOUNTER — Encounter: Payer: Self-pay | Admitting: Internal Medicine

## 2017-04-24 VITALS — BP 131/79 | HR 74 | Temp 98.5°F | Resp 16 | Wt 156.0 lb

## 2017-04-24 DIAGNOSIS — Z7982 Long term (current) use of aspirin: Secondary | ICD-10-CM | POA: Insufficient documentation

## 2017-04-24 DIAGNOSIS — Z8673 Personal history of transient ischemic attack (TIA), and cerebral infarction without residual deficits: Secondary | ICD-10-CM | POA: Diagnosis not present

## 2017-04-24 DIAGNOSIS — Z79899 Other long term (current) drug therapy: Secondary | ICD-10-CM | POA: Diagnosis not present

## 2017-04-24 DIAGNOSIS — E785 Hyperlipidemia, unspecified: Secondary | ICD-10-CM | POA: Insufficient documentation

## 2017-04-24 DIAGNOSIS — R7989 Other specified abnormal findings of blood chemistry: Secondary | ICD-10-CM | POA: Insufficient documentation

## 2017-04-24 DIAGNOSIS — R946 Abnormal results of thyroid function studies: Secondary | ICD-10-CM

## 2017-04-24 DIAGNOSIS — Z880 Allergy status to penicillin: Secondary | ICD-10-CM | POA: Diagnosis not present

## 2017-04-24 DIAGNOSIS — Z23 Encounter for immunization: Secondary | ICD-10-CM

## 2017-04-24 DIAGNOSIS — I10 Essential (primary) hypertension: Secondary | ICD-10-CM | POA: Insufficient documentation

## 2017-04-24 MED ORDER — AMLODIPINE BESYLATE 10 MG PO TABS: 10.0000 mg | ORAL_TABLET | Freq: Every day | ORAL | 3 refills | Status: DC

## 2017-04-24 MED ORDER — HYDROCHLOROTHIAZIDE 12.5 MG PO CAPS: 12.5000 mg | ORAL_CAPSULE | Freq: Every day | ORAL | 3 refills | Status: DC

## 2017-04-24 MED ORDER — IRBESARTAN 300 MG PO TABS: 300.0000 mg | ORAL_TABLET | Freq: Every day | ORAL | 3 refills | Status: DC

## 2017-04-24 NOTE — Patient Instructions (Signed)
Dyslipidemia Dyslipidemia is an imbalance of waxy, fat-like substances (lipids) in the blood. The body needs lipids in small amounts. Dyslipidemia often involves a high level of cholesterol or triglycerides, which are types of lipids. Common forms of dyslipidemia include:  High levels of bad cholesterol (LDL cholesterol). LDL is the type of cholesterol that causes fatty deposits (plaques) to build up in the blood vessels that carry blood away from your heart (arteries).  Low levels of good cholesterol (HDL cholesterol). HDL cholesterol is the type of cholesterol that protects against heart disease. High levels of HDL remove the LDL buildup from arteries.  High levels of triglycerides. Triglycerides are a fatty substance in the blood that is linked to a buildup of plaques in the arteries.  You can develop dyslipidemia because of the genes you are born with (primary dyslipidemia) or changes that occur during your life (secondary dyslipidemia), or as a side effect of certain medical treatments. What are the causes? Primary dyslipidemia is caused by changes (mutations) in genes that are passed down through families (inherited). These mutations cause several types of dyslipidemia. Mutations can result in disorders that make the body produce too much LDL cholesterol or triglycerides, or not enough HDL cholesterol. These disorders may lead to heart disease, arterial disease, or stroke at an early age. Causes of secondary dyslipidemia include certain lifestyle choices and diseases that lead to dyslipidemia, such as:  Eating a diet that is high in animal fat.  Not getting enough activity or exercise (having a sedentary lifestyle).  Having diabetes, kidney disease, liver disease, or thyroid disease.  Drinking large amounts of alcohol.  Using certain types of drugs.  What increases the risk? You may be at greater risk for dyslipidemia if you are an older man or if you are a woman who has gone through  menopause. Other risk factors include:  Having a family history of dyslipidemia.  Taking certain medicines, including birth control pills, steroids, some diuretics, beta-blockers, and some medicines forHIV.  Smoking cigarettes.  Eating a high-fat diet.  Drinking large amounts of alcohol.  Having certain medical conditions such as diabetes, polycystic ovary syndrome (PCOS), pregnancy, kidney disease, liver disease, or hypothyroidism.  Not exercising regularly.  Being overweight or obese with too much belly fat.  What are the signs or symptoms? Dyslipidemia does not usually cause any symptoms. Very high lipid levels can cause fatty bumps under the skin (xanthomas) or a white or gray ring around the black center (pupil) of the eye. Very high triglyceride levels can cause inflammation of the pancreas (pancreatitis). How is this diagnosed? Your health care provider may diagnose dyslipidemia based on a routine blood test (fasting blood test). Because most people do not have symptoms of the condition, this blood testing (lipid profile) is done on adults age 40 and older and is repeated every 5 years. This test checks:  Total cholesterol. This is a measure of the total amount of cholesterol in your blood, including LDL cholesterol, HDL cholesterol, and triglycerides. A healthy number is below 200.  LDL cholesterol. The target number for LDL cholesterol is different for each person, depending on individual risk factors. For most people, a number below 100 is healthy. Ask your health care provider what your LDL cholesterol number should be.  HDL cholesterol. An HDL level of 60 or higher is best because it helps to protect against heart disease. A number below 42 for men or below 39 for women increases the risk for heart disease.  Triglycerides. A  healthy triglyceride number is below 150.  If your lipid profile is abnormal, your health care provider may do other blood tests to get more  information about your condition. How is this treated? Treatment depends on the type of dyslipidemia that you have and your other risk factors for heart disease and stroke. Your health care provider will have a target range for your lipid levels based on this information. For many people, treatment starts with lifestyle changes, such as diet and exercise. Your health care provider may recommend that you:  Get regular exercise.  Make changes to your diet.  Quit smoking if you smoke.  If diet changes and exercise do not help you reach your goals, your health care provider may also prescribe medicine to lower lipids. The most commonly prescribed type of medicine lowers your LDL cholesterol (statin drug). If you have a high triglyceride level, your provider may prescribe another type of drug (fibrate) or an omega-3 fish oil supplement, or both. Follow these instructions at home:  Take over-the-counter and prescription medicines only as told by your health care provider. This includes supplements.  Get regular exercise. Start an aerobic exercise and strength training program as told by your health care provider. Ask your health care provider what activities are safe for you. Your health care provider may recommend: ? 30 minutes of aerobic activity 4-6 days a week. Brisk walking is an example of aerobic activity. ? Strength training 2 days a week.  Eat a healthy diet as told by your health care provider. This can help you reach and maintain a healthy weight, lower your LDL cholesterol, and raise your HDL cholesterol. It may help to work with a diet and nutrition specialist (dietitian) to make a plan that is right for you. Your dietitian or health care provider may recommend: ? Limiting your calories, if you are overweight. ? Eating more fruits, vegetables, whole grains, fish, and lean meats. ? Limiting saturated fat, trans fat, and cholesterol.  Follow instructions from your health care provider  or dietitian about eating or drinking restrictions.  Limit alcohol intake to no more than one drink per day for nonpregnant women and two drinks per day for men. One drink equals 12 oz of beer, 5 oz of wine, or 1 oz of hard liquor.  Do not use any products that contain nicotine or tobacco, such as cigarettes and e-cigarettes. If you need help quitting, ask your health care provider.  Keep all follow-up visits as told by your health care provider. This is important. Contact a health care provider if:  You are having trouble sticking to your exercise or diet plan.  You are struggling to quit smoking or control your use of alcohol. Summary  Dyslipidemia is an imbalance of waxy, fat-like substances (lipids) in the blood. The body needs lipids in small amounts. Dyslipidemia often involves a high level of cholesterol or triglycerides, which are types of lipids.  Treatment depends on the type of dyslipidemia that you have and your other risk factors for heart disease and stroke.  For many people, treatment starts with lifestyle changes, such as diet and exercise. Your health care provider may also prescribe medicine to lower lipids. This information is not intended to replace advice given to you by your health care provider. Make sure you discuss any questions you have with your health care provider. Document Released: 08/06/2013 Document Revised: 03/28/2016 Document Reviewed: 03/28/2016 Elsevier Interactive Patient Education  2018 [place] Eating Plan DASH stands for "  Dietary Approaches to Stop Hypertension." The DASH eating plan is a healthy eating plan that has been shown to reduce high blood pressure (hypertension). It may also reduce your risk for type 2 diabetes, heart disease, and stroke. The DASH eating plan may also help with weight loss. What are tips for following this plan? General guidelines  Avoid eating more than 2,300 mg (milligrams) of salt (sodium) a day. If you have  hypertension, you may need to reduce your sodium intake to 1,500 mg a day.  Limit alcohol intake to no more than 1 drink a day for nonpregnant women and 2 drinks a day for men. One drink equals 12 oz of beer, 5 oz of wine, or 1 oz of hard liquor.  Work with your health care provider to maintain a healthy body weight or to lose weight. Ask what an ideal weight is for you.  Get at least 30 minutes of exercise that causes your heart to beat faster (aerobic exercise) most days of the week. Activities may include walking, swimming, or biking.  Work with your health care provider or diet and nutrition specialist (dietitian) to adjust your eating plan to your individual calorie needs. Reading food labels  Check food labels for the amount of sodium per serving. Choose foods with less than 5 percent of the Daily Value of sodium. Generally, foods with less than 300 mg of sodium per serving fit into this eating plan.  To find whole grains, look for the word "whole" as the first word in the ingredient list. Shopping  Buy products labeled as "low-sodium" or "no salt added."  Buy fresh foods. Avoid canned foods and premade or frozen meals. Cooking  Avoid adding salt when cooking. Use salt-free seasonings or herbs instead of table salt or sea salt. Check with your health care provider or pharmacist before using salt substitutes.  Do not fry foods. Cook foods using healthy methods such as baking, boiling, grilling, and broiling instead.  Cook with heart-healthy oils, such as olive, canola, soybean, or sunflower oil. Meal planning   Eat a balanced diet that includes: ? 5 or more servings of fruits and vegetables each day. At each meal, try to fill half of your plate with fruits and vegetables. ? Up to 6-8 servings of whole grains each day. ? Less than 6 oz of lean meat, poultry, or fish each day. A 3-oz serving of meat is about the same size as a deck of cards. One egg equals 1 oz. ? 2 servings of  low-fat dairy each day. ? A serving of nuts, seeds, or beans 5 times each week. ? Heart-healthy fats. Healthy fats called Omega-3 fatty acids are found in foods such as flaxseeds and coldwater fish, like sardines, salmon, and mackerel.  Limit how much you eat of the following: ? Canned or prepackaged foods. ? Food that is high in trans fat, such as fried foods. ? Food that is high in saturated fat, such as fatty meat. ? Sweets, desserts, sugary drinks, and other foods with added sugar. ? Full-fat dairy products.  Do not salt foods before eating.  Try to eat at least 2 vegetarian meals each week.  Eat more home-cooked food and less restaurant, buffet, and fast food.  When eating at a restaurant, ask that your food be prepared with less salt or no salt, if possible. What foods are recommended? The items listed may not be a complete list. Talk with your dietitian about what dietary choices are best for  you. Grains Whole-grain or whole-wheat bread. Whole-grain or whole-wheat pasta. Brown rice. [place]. Bulgur. Whole-grain and low-sodium cereals. Pita bread. Low-fat, low-sodium crackers. Whole-wheat flour tortillas. Vegetables Fresh or frozen vegetables (raw, steamed, roasted, or grilled). Low-sodium or reduced-sodium tomato and vegetable juice. Low-sodium or reduced-sodium tomato sauce and tomato paste. Low-sodium or reduced-sodium canned vegetables. Fruits All fresh, dried, or frozen fruit. Canned fruit in natural juice (without added sugar). Meat and other protein foods Skinless chicken or [place]. Ground chicken or [place]. Pork with fat trimmed off. Fish and seafood. Egg whites. Dried beans, peas, or lentils. Unsalted nuts, nut butters, and seeds. Unsalted canned beans. Lean cuts of beef with fat trimmed off. Low-sodium, lean deli meat. Dairy Low-fat (1%) or fat-free (skim) milk. Fat-free, low-fat, or reduced-fat cheeses. Nonfat, low-sodium ricotta or cottage cheese. Low-fat or  nonfat yogurt. Low-fat, low-sodium cheese. Fats and oils Soft margarine without trans fats. Vegetable oil. Low-fat, reduced-fat, or light mayonnaise and salad dressings (reduced-sodium). Canola, safflower, olive, soybean, and sunflower oils. Avocado. Seasoning and other foods Herbs. Spices. Seasoning mixes without salt. Unsalted popcorn and pretzels. Fat-free sweets. What foods are not recommended? The items listed may not be a complete list. Talk with your dietitian about what dietary choices are best for you. Grains Baked goods made with fat, such as croissants, muffins, or some breads. Dry pasta or rice meal packs. Vegetables Creamed or fried vegetables. Vegetables in a cheese sauce. Regular canned vegetables (not low-sodium or reduced-sodium). Regular canned tomato sauce and paste (not low-sodium or reduced-sodium). Regular tomato and vegetable juice (not low-sodium or reduced-sodium). Angie Fava. Olives. Fruits Canned fruit in a light or heavy syrup. Fried fruit. Fruit in cream or butter sauce. Meat and other protein foods Fatty cuts of meat. Ribs. Fried meat. Berniece Salines. Sausage. Bologna and other processed lunch meats. Salami. Fatback. Hotdogs. Bratwurst. Salted nuts and seeds. Canned beans with added salt. Canned or smoked fish. Whole eggs or egg yolks. Chicken or [place] with skin. Dairy Whole or 2% milk, cream, and half-and-half. Whole or full-fat cream cheese. Whole-fat or sweetened yogurt. Full-fat cheese. Nondairy creamers. Whipped toppings. Processed cheese and cheese spreads. Fats and oils Butter. Stick margarine. Lard. Shortening. Ghee. Bacon fat. Tropical oils, such as coconut, palm kernel, or palm oil. Seasoning and other foods Salted popcorn and pretzels. Onion salt, garlic salt, seasoned salt, table salt, and sea salt. Worcestershire sauce. Tartar sauce. Barbecue sauce. Teriyaki sauce. Soy sauce, including reduced-sodium. Steak sauce. Canned and packaged gravies. Fish sauce. Oyster  sauce. Cocktail sauce. Horseradish that you find on the shelf. Ketchup. Mustard. Meat flavorings and tenderizers. Bouillon cubes. Hot sauce and Tabasco sauce. Premade or packaged marinades. Premade or packaged taco seasonings. Relishes. Regular salad dressings. Where to find more information:  National Heart, Lung, and [place]: https://wilson-eaton.com/  American Heart Association: www.heart.org Summary  The DASH eating plan is a healthy eating plan that has been shown to reduce high blood pressure (hypertension). It may also reduce your risk for type 2 diabetes, heart disease, and stroke.  With the DASH eating plan, you should limit salt (sodium) intake to 2,300 mg a day. If you have hypertension, you may need to reduce your sodium intake to 1,500 mg a day.  When on the DASH eating plan, aim to eat more fresh fruits and vegetables, whole grains, lean proteins, low-fat dairy, and heart-healthy fats.  Work with your health care provider or diet and nutrition specialist (dietitian) to adjust your eating plan to your individual calorie needs. This information is  not intended to replace advice given to you by your health care provider. Make sure you discuss any questions you have with your health care provider. Document Released: 07/21/2011 Document Revised: 07/25/2016 Document Reviewed: 07/25/2016 Elsevier Interactive Patient Education  2017 [employer] Hypertension Hypertension, commonly called high blood pressure, is when the force of blood pumping through the arteries is too strong. The arteries are the blood vessels that carry blood from the heart throughout the body. Hypertension forces the heart to work harder to pump blood and may cause arteries to become narrow or stiff. Having untreated or uncontrolled hypertension can cause heart attacks, strokes, kidney disease, and other problems. A blood pressure reading consists of a higher number over a lower number. Ideally, your blood pressure  should be below 120/80. The first ("top") number is called the systolic pressure. It is a measure of the pressure in your arteries as your heart beats. The second ("bottom") number is called the diastolic pressure. It is a measure of the pressure in your arteries as the heart relaxes. What are the causes? The cause of this condition is not known. What increases the risk? Some risk factors for high blood pressure are under your control. Others are not. Factors you can change  Smoking.  Having type 2 diabetes mellitus, high cholesterol, or both.  Not getting enough exercise or physical activity.  Being overweight.  Having too much fat, sugar, calories, or salt (sodium) in your diet.  Drinking too much alcohol. Factors that are difficult or impossible to change  Having chronic kidney disease.  Having a family history of high blood pressure.  Age. Risk increases with age.  Race. You may be at higher risk if you are African-American.  Gender. Men are at higher risk than women before age 57. After age 28, women are at higher risk than men.  Having obstructive sleep apnea.  Stress. What are the signs or symptoms? Extremely high blood pressure (hypertensive crisis) may cause:  Headache.  Anxiety.  Shortness of breath.  Nosebleed.  Nausea and vomiting.  Severe chest pain.  Jerky movements you cannot control (seizures).  How is this diagnosed? This condition is diagnosed by measuring your blood pressure while you are seated, with your arm resting on a surface. The cuff of the blood pressure monitor will be placed directly against the skin of your upper arm at the level of your heart. It should be measured at least twice using the same arm. Certain conditions can cause a difference in blood pressure between your right and left arms. Certain factors can cause blood pressure readings to be lower or higher than normal (elevated) for a short period of time:  When your blood  pressure is higher when you are in a health care provider's office than when you are at home, this is called white coat hypertension. Most people with this condition do not need medicines.  When your blood pressure is higher at home than when you are in a health care provider's office, this is called masked hypertension. Most people with this condition may need medicines to control blood pressure.  If you have a high blood pressure reading during one visit or you have normal blood pressure with other risk factors:  You may be asked to return on a different day to have your blood pressure checked again.  You may be asked to monitor your blood pressure at home for 1 week or longer.  If you are diagnosed with hypertension, you may have  other blood or imaging tests to help your health care provider understand your overall risk for other conditions. How is this treated? This condition is treated by making healthy lifestyle changes, such as eating healthy foods, exercising more, and reducing your alcohol intake. Your health care provider may prescribe medicine if lifestyle changes are not enough to get your blood pressure under control, and if:  Your systolic blood pressure is above 130.  Your diastolic blood pressure is above 80.  Your personal target blood pressure may vary depending on your medical conditions, your age, and other factors. Follow these instructions at home: Eating and drinking  Eat a diet that is high in fiber and potassium, and low in sodium, added sugar, and fat. An example eating plan is called the DASH (Dietary Approaches to Stop Hypertension) diet. To eat this way: ? Eat plenty of fresh fruits and vegetables. Try to fill half of your plate at each meal with fruits and vegetables. ? Eat whole grains, such as whole wheat pasta, brown rice, or whole grain bread. Fill about one quarter of your plate with whole grains. ? Eat or drink low-fat dairy products, such as skim milk or  low-fat yogurt. ? Avoid fatty cuts of meat, processed or cured meats, and poultry with skin. Fill about one quarter of your plate with lean proteins, such as fish, chicken without skin, beans, eggs, and tofu. ? Avoid premade and processed foods. These tend to be higher in sodium, added sugar, and fat.  Reduce your daily sodium intake. Most people with hypertension should eat less than 1,500 mg of sodium a day.  Limit alcohol intake to no more than 1 drink a day for nonpregnant women and 2 drinks a day for men. One drink equals 12 oz of beer, 5 oz of wine, or 1 oz of hard liquor. Lifestyle  Work with your health care provider to maintain a healthy body weight or to lose weight. Ask what an ideal weight is for you.  Get at least 30 minutes of exercise that causes your heart to beat faster (aerobic exercise) most days of the week. Activities may include walking, swimming, or biking.  Include exercise to strengthen your muscles (resistance exercise), such as pilates or lifting weights, as part of your weekly exercise routine. Try to do these types of exercises for 30 minutes at least 3 days a week.  Do not use any products that contain nicotine or tobacco, such as cigarettes and e-cigarettes. If you need help quitting, ask your health care provider.  Monitor your blood pressure at home as told by your health care provider.  Keep all follow-up visits as told by your health care provider. This is important. Medicines  Take over-the-counter and prescription medicines only as told by your health care provider. Follow directions carefully. Blood pressure medicines must be taken as prescribed.  Do not skip doses of blood pressure medicine. Doing this puts you at risk for problems and can make the medicine less effective.  Ask your health care provider about side effects or reactions to medicines that you should watch for. Contact a health care provider if:  You think you are having a reaction to  a medicine you are taking.  You have headaches that keep coming back (recurring).  You feel dizzy.  You have swelling in your ankles.  You have trouble with your vision. Get help right away if:  You develop a severe headache or confusion.  You have unusual weakness or numbness.  You feel faint.  You have severe pain in your chest or abdomen.  You vomit repeatedly.  You have trouble breathing. Summary  Hypertension is when the force of blood pumping through your arteries is too strong. If this condition is not controlled, it may put you at risk for serious complications.  Your personal target blood pressure may vary depending on your medical conditions, your age, and other factors. For most people, a normal blood pressure is less than 120/80.  Hypertension is treated with lifestyle changes, medicines, or a combination of both. Lifestyle changes include weight loss, eating a healthy, low-sodium diet, exercising more, and limiting alcohol. This information is not intended to replace advice given to you by your health care provider. Make sure you discuss any questions you have with your health care provider. Document Released: 08/01/2005 Document Revised: 06/29/2016 Document Reviewed: 06/29/2016 Elsevier Interactive Patient Education  [employer].

## 2017-04-24 NOTE — Progress Notes (Signed)
Michelle Sosa, is a 71 y.o. female  CBJ:[id number]  HYW:[id number]  DOB - [birth year]  No chief complaint on file.      Subjective:   Michelle Sosa is a 71 y.o. female with history of hypertension and remote CVA here today for a follow up visit. She was seen here in June, had Lab test done, showed high LDL and high TSH. Here today to discuss result, and to have her thyroid function tested properly. She has no complaint. She needs refill of her medications. She is adherent, has no complaint of side effect. She does not want to start statin because of severe side effect experienced by family members. She denies being depressed, she is awaiting dental work for her poor dentition. Patient has No headache, No chest pain, No abdominal pain - No Nausea, No new weakness tingling or numbness, No Cough - SOB.  Problem  High Serum Thyroid Stimulating Hormone (Tsh)    ALLERGIES: Allergies  Allergen Reactions  . Codeine   . Penicillins   . Sulfa Antibiotics     PAST MEDICAL HISTORY: Past Medical History:  Diagnosis Date  . Hypertension   . Stroke [facility])     MEDICATIONS AT HOME: Prior to Admission medications   Medication Sig Start Date End Date Taking? Authorizing Provider  amLODipine (NORVASC) 10 MG tablet Take 1 tablet (10 mg total) by mouth daily. 04/24/17  Yes Tresa Garter, MD  aspirin 81 MG EC tablet Take 1 tablet (81 mg total) by mouth daily. Swallow whole. 02/13/13  Yes Reyne Dumas, MD  hydrochlorothiazide (MICROZIDE) 12.5 MG capsule Take 1 capsule (12.5 mg total) by mouth daily. 04/24/17  Yes Novalee Horsfall, Marlena Clipper, MD  irbesartan (AVAPRO) 300 MG tablet Take 1 tablet (300 mg total) by mouth at bedtime. 04/24/17  Yes Tresa Garter, MD  multivitamin-iron-minerals-folic acid (CENTRUM) chewable tablet Chew 1 tablet by mouth daily.   Yes [provider]  thiamine (VITAMIN B-1) 100 MG tablet Take 100 mg by mouth daily.   Yes [provider]  vitamin  B-12 (CYANOCOBALAMIN) 100 MCG tablet Take 100 mcg by mouth daily.   Yes [provider]  diclofenac sodium (VOLTAREN) 1 % GEL Apply 2 g topically 2 (two) times daily. Patient not taking: Reported on 04/24/2017 01/13/14   Lance Bosch, NP  diphenhydrAMINE (BENADRYL) 25 MG tablet Take 1 tablet (25 mg total) by mouth 2 (two) times daily. Take 1 tab 30 min before taking Bactrim Patient not taking: Reported on 04/24/2017 07/19/16   Tresa Garter, MD    Objective:   Vitals:   04/24/17 1008  BP: 131/79  Pulse: 74  Resp: 16  Temp: 98.5 F (36.9 C)  TempSrc: Oral  SpO2: 96%  Weight: 156 lb (70.8 kg)   Exam General appearance : Awake, alert, not in any distress. Speech Clear. Not toxic looking, poor dentition HEENT: Atraumatic and Normocephalic, pupils equally reactive to light and accomodation Neck: Supple, no JVD. No cervical lymphadenopathy.  Chest: Good air entry bilaterally, no added sounds  CVS: S1 S2 regular, no murmurs.  Abdomen: Bowel sounds present, Non tender and not distended with no gaurding, rigidity or rebound. Extremities: B/L Lower Ext shows no edema, both legs are warm to touch Neurology: Awake alert, and oriented X 3, CN II-XII intact, Non focal Skin: No Rash  Data Review Lab Results  Component Value Date   HGBA1C 4.9 01/18/2017   HGBA1C 5.4 01/18/2010   HGBA1C  05/13/2007    5.3 (  NOTE)   The ADA recommends the following therapeutic goals for glycemic   control related to Hgb A1C measurement:   Goal of Therapy:   < 7.0% Hgb A1C   Action Suggested:  > 8.0% Hgb A1C   Ref:  Diabetes Care, 22, Suppl. 1, 1999    Assessment & Plan   1. Essential hypertension, benign  - amLODipine (NORVASC) 10 MG tablet; Take 1 tablet (10 mg total) by mouth daily.  Dispense: 90 tablet; Refill: 3 - irbesartan (AVAPRO) 300 MG tablet; Take 1 tablet (300 mg total) by mouth at bedtime.  Dispense: 90 tablet; Refill: 3 - hydrochlorothiazide (MICROZIDE) 12.5 MG capsule; Take 1  capsule (12.5 mg total) by mouth daily.  Dispense: 90 capsule; Refill: 3  2. Dyslipidemia - Patient prefers to try dietary control and exercise before starting medications, she claims Statin caused a lot of "muscle problems for my family members".  To address this please limit saturated fat to no more than 7% of your calories, limit cholesterol to 200 mg/day, increase fiber and exercise as tolerated.   3. History of stroke  - Stable  4. High serum thyroid stimulating hormone (TSH)  - TSH - T4, Free - T3  Patient have been counseled extensively about nutrition and exercise. Other issues discussed during this visit include: low cholesterol diet, weight control and daily exercise, foot care,  importance of adherence with medications and regular follow-up. We also discussed long term complications of uncontrolled diabetes and hypertension.   Return in about 6 months (around 10/22/2017) for Follow up HTN, Follow up Pain and comorbidities.  The patient was given clear instructions to go to ER or return to medical center if symptoms don't improve, worsen or new problems develop. The patient verbalized understanding. The patient was told to call to get lab results if they haven't heard anything in the next week.   This note has been created with Surveyor, quantity. Any transcriptional errors are unintentional.    Angelica Chessman, MD, [place], Karilyn Cota, [place] and [place], [place]   04/24/2017, 10:59 AM

## 2017-04-24 NOTE — Progress Notes (Signed)
Michelle Sosa has concerns about labs and medications

## 2017-04-25 LAB — T3: T3 TOTAL: 124 ng/dL (ref 71–180)

## 2017-04-25 LAB — T4, FREE: FREE T4: 1.34 ng/dL (ref 0.82–1.77)

## 2017-04-25 LAB — TSH: TSH: 4.82 u[IU]/mL — AB (ref 0.450–4.500)

## 2017-04-26 ENCOUNTER — Ambulatory Visit: Payer: Medicare Other | Admitting: Internal Medicine

## 2017-05-01 ENCOUNTER — Telehealth: Payer: Self-pay | Admitting: *Deleted

## 2017-05-01 NOTE — Telephone Encounter (Signed)
Patient verified DOB Patient is aware of thyroid function being within normal range. No further questions at this time.

## 2017-05-01 NOTE — Telephone Encounter (Signed)
-----   Message from Tresa Garter, MD sent at 04/28/2017 10:28 AM EDT ----- Thyroid function is within normal range. Will continue to monitor

## 2018-01-16 ENCOUNTER — Ambulatory Visit: Payer: PPO | Attending: Nurse Practitioner | Admitting: Nurse Practitioner

## 2018-01-16 ENCOUNTER — Encounter: Payer: Self-pay | Admitting: Nurse Practitioner

## 2018-01-16 VITALS — BP 156/77 | HR 69 | Temp 97.7°F | Ht 63.0 in | Wt 157.8 lb

## 2018-01-16 DIAGNOSIS — Z880 Allergy status to penicillin: Secondary | ICD-10-CM | POA: Diagnosis not present

## 2018-01-16 DIAGNOSIS — R7989 Other specified abnormal findings of blood chemistry: Secondary | ICD-10-CM | POA: Diagnosis not present

## 2018-01-16 DIAGNOSIS — E2839 Other primary ovarian failure: Secondary | ICD-10-CM | POA: Diagnosis not present

## 2018-01-16 DIAGNOSIS — Z885 Allergy status to narcotic agent status: Secondary | ICD-10-CM | POA: Diagnosis not present

## 2018-01-16 DIAGNOSIS — E785 Hyperlipidemia, unspecified: Secondary | ICD-10-CM | POA: Diagnosis not present

## 2018-01-16 DIAGNOSIS — I10 Essential (primary) hypertension: Secondary | ICD-10-CM | POA: Insufficient documentation

## 2018-01-16 DIAGNOSIS — Z7982 Long term (current) use of aspirin: Secondary | ICD-10-CM | POA: Insufficient documentation

## 2018-01-16 DIAGNOSIS — B1920 Unspecified viral hepatitis C without hepatic coma: Secondary | ICD-10-CM | POA: Insufficient documentation

## 2018-01-16 DIAGNOSIS — Z882 Allergy status to sulfonamides status: Secondary | ICD-10-CM | POA: Insufficient documentation

## 2018-01-16 DIAGNOSIS — Z79899 Other long term (current) drug therapy: Secondary | ICD-10-CM | POA: Insufficient documentation

## 2018-01-16 DIAGNOSIS — Z8673 Personal history of transient ischemic attack (TIA), and cerebral infarction without residual deficits: Secondary | ICD-10-CM | POA: Insufficient documentation

## 2018-01-16 DIAGNOSIS — Z1231 Encounter for screening mammogram for malignant neoplasm of breast: Secondary | ICD-10-CM

## 2018-01-16 DIAGNOSIS — R350 Frequency of micturition: Secondary | ICD-10-CM | POA: Diagnosis not present

## 2018-01-16 LAB — POCT URINALYSIS DIPSTICK
Bilirubin, UA: NEGATIVE
Blood, UA: NEGATIVE
Glucose, UA: NEGATIVE
Ketones, UA: NEGATIVE
Nitrite, UA: NEGATIVE
PH UA: 6 (ref 5.0–8.0)
PROTEIN UA: NEGATIVE
Spec Grav, UA: <=1.005 — AB (ref 1.010–1.025)
Urobilinogen, UA: 0.2 E.U./dL

## 2018-01-16 MED ORDER — AMLODIPINE BESYLATE 10 MG PO TABS: 10.0000 mg | ORAL_TABLET | Freq: Every day | ORAL | 3 refills | Status: DC

## 2018-01-16 MED ORDER — IRBESARTAN 300 MG PO TABS: 300.0000 mg | ORAL_TABLET | Freq: Every day | ORAL | 3 refills | Status: DC

## 2018-01-16 MED ORDER — HYDROCHLOROTHIAZIDE 12.5 MG PO CAPS
12.5000 mg | ORAL_CAPSULE | Freq: Every day | ORAL | 3 refills | Status: DC
Start: 2018-01-16 — End: 2018-06-23

## 2018-01-16 MED ORDER — SULFAMETHOXAZOLE-TRIMETHOPRIM 800-160 MG PO TABS: 1.0000 | ORAL_TABLET | Freq: Two times a day (BID) | ORAL | 0 refills | Status: AC

## 2018-01-16 MED ORDER — HYDROCHLOROTHIAZIDE 12.5 MG PO CAPS: 12.5000 mg | ORAL_CAPSULE | Freq: Every day | ORAL | 3 refills | Status: DC

## 2018-01-16 NOTE — Progress Notes (Signed)
Assessment & Plan:  [name] was seen today for establish care and polyuria.  Diagnoses and all orders for this visit:  Essential hypertension, benign -     amLODipine (NORVASC) 10 MG tablet; Take 1 tablet (10 mg total) by mouth daily. -     hydrochlorothiazide (MICROZIDE) 12.5 MG capsule; Take 1 capsule (12.5 mg total) by mouth daily. -     irbesartan (AVAPRO) 300 MG tablet; Take 1 tablet (300 mg total) by mouth at bedtime. -     CBC -     CMP14+EGFR Continue all antihypertensives as prescribed.  Remember to bring in your blood pressure log with you for your follow up appointment.  DASH/Mediterranean Diets are healthier choices for HTN.    Dyslipidemia -     Lipid panel INSTRUCTIONS: Work on a low fat, heart healthy diet and participate in regular aerobic exercise program by working out at least 150 minutes per week. No fried foods. No junk foods, sodas, sugary drinks, unhealthy snacking, alcohol or smoking.    Urine frequency -     Urinalysis Dipstick -     CULTURE, URINE COMPREHENSIVE -     Urinalysis, Complete  Breast cancer screening by mammogram -     MM 3D SCREEN BREAST BILATERAL; Future  Estrogen deficiency -     DG Bone Density; Future  Abnormal TSH -     Thyroid Panel With TSH  Other orders -     sulfamethoxazole-trimethoprim (BACTRIM DS,SEPTRA DS) 800-160 MG tablet; Take 1 tablet by mouth 2 (two) times daily for 3 days. -     Microscopic Examination    Patient has been counseled on age-appropriate routine health concerns for screening and prevention. These are reviewed and up-to-date. Referrals have been placed accordingly. Immunizations are up-to-date or declined.    Subjective:   Chief Complaint  Patient presents with  . Establish Care    Establish care for hypertension.   . Polyuria    Pt. stated she's been having the urge to pee at night often.    HPI Michelle Sosa 72 y.o. female presents to office today to establish care.    Essential  Hypertension Chronic. Not well controlled today despite her endorsement of medication compliance. She takes microzide 12.79m daily, avapro 3041mdaily and amlodipine 1036maily.  She spot checks her blood pressure and reports normal readings at home. Denies chest pain, shortness of breath, palpitations, lightheadedness, dizziness, headaches or BLE edema.  BP Readings from Last 3 Encounters:  01/16/18 (!) 156/77  04/24/17 131/79  01/18/17 (!) 145/80   UTI symptoms Onset several weeks ago. Endorses increased urinary frequency. Denies hematuria, urgerncy or hesistancy. No vaginal symptoms. She reports taking cipro in the past and endorses significant pain in her wrists after taking it.   Hyperlipidemia Patient presents for follow up to hyperlipidemia.  She is  not medication compliant refuses to take a statin.  She is diet compliant and denies fatigue and skin xanthelasma or statin intolerance including myalgias. I have instructed her to take Omega 3 . Lab Results  Component Value Date   CHOL 198 01/16/2018   Lab Results  Component Value Date   HDL 48 01/16/2018   Lab Results  Component Value Date   LDLCALC 123 (H) 01/16/2018   Lab Results  Component Value Date   TRIG 137 01/16/2018   Lab Results  Component Value Date   CHOLHDL 4.1 01/16/2018    Review of Systems  Constitutional: Negative for fever, malaise/fatigue  and weight loss.  HENT: Negative.  Negative for nosebleeds.   Eyes: Negative.  Negative for blurred vision, double vision and photophobia.  Respiratory: Negative.  Negative for cough and shortness of breath.   Cardiovascular: Negative.  Negative for chest pain, palpitations and leg swelling.  Gastrointestinal: Negative.  Negative for heartburn, nausea and vomiting.  Musculoskeletal: Negative.  Negative for myalgias.  Neurological: Negative.  Negative for dizziness, focal weakness, seizures and headaches.  Psychiatric/Behavioral: Negative.  Negative for suicidal  ideas.    Past Medical History:  Diagnosis Date  . Hepatitis C   . Hypertension   . Stroke [facility])     Past Surgical History:  Procedure Laterality Date  . KNEE SURGERY  2005  . LEG SURGERY     right leg, a rod.  Marland Kitchen MASTECTOMY Right   . WRIST SURGERY  2008    Family History  Problem Relation Age of Onset  . Heart disease Father     Social History Reviewed with no changes to be made today.   Outpatient Medications Prior to Visit  Medication Sig Dispense Refill  . aspirin 81 MG EC tablet Take 1 tablet (81 mg total) by mouth daily. Swallow whole. 30 tablet 12  . multivitamin-iron-minerals-folic acid (CENTRUM) chewable tablet Chew 1 tablet by mouth daily.    Marland Kitchen thiamine (VITAMIN B-1) 100 MG tablet Take 100 mg by mouth daily.    . vitamin B-12 (CYANOCOBALAMIN) 100 MCG tablet Take 100 mcg by mouth daily.    Marland Kitchen amLODipine (NORVASC) 10 MG tablet Take 1 tablet (10 mg total) by mouth daily. 90 tablet 3  . hydrochlorothiazide (MICROZIDE) 12.5 MG capsule Take 1 capsule (12.5 mg total) by mouth daily. 90 capsule 3  . irbesartan (AVAPRO) 300 MG tablet Take 1 tablet (300 mg total) by mouth at bedtime. 90 tablet 3  . diclofenac sodium (VOLTAREN) 1 % GEL Apply 2 g topically 2 (two) times daily. (Patient not taking: Reported on 04/24/2017) 100 g 0  . diphenhydrAMINE (BENADRYL) 25 MG tablet Take 1 tablet (25 mg total) by mouth 2 (two) times daily. Take 1 tab 30 min before taking Bactrim (Patient not taking: Reported on 04/24/2017) 20 tablet 0   No facility-administered medications prior to visit.     Allergies  Allergen Reactions  . Codeine   . Penicillins   . Sulfa Antibiotics        Objective:    BP (!) 156/77 (BP Location: Left Arm, Patient Position: Sitting, Cuff Size: Normal)   Pulse 69   Temp 97.7 F (36.5 C) (Oral)   Ht _0  (1.6 m)   Wt 157 lb 12.8 oz (71.6 kg)   SpO2 95%   BMI 27.95 kg/m  Wt Readings from Last 3 Encounters:  01/16/18 157 lb 12.8 oz (71.6 kg)  04/24/17  156 lb (70.8 kg)  01/18/17 163 lb (73.9 kg)    Physical Exam  Constitutional: She is oriented to person, place, and time. She appears well-developed and well-nourished. She is cooperative.  HENT:  Head: Normocephalic and atraumatic.  Eyes: EOM are normal.  Neck: Normal range of motion.  Cardiovascular: Normal rate, regular rhythm and normal heart sounds. Exam reveals no gallop and no friction rub.  No murmur heard. Pulmonary/Chest: Effort normal and breath sounds normal. No tachypnea. No respiratory distress. She has no decreased breath sounds. She has no wheezes. She has no rhonchi. She has no rales. She exhibits no tenderness.  Abdominal: Bowel sounds are normal.  Musculoskeletal: Normal range  of motion. She exhibits no edema.  Neurological: She is alert and oriented to person, place, and time. She displays no seizure activity. Coordination normal.  Skin: Skin is warm and dry.  Psychiatric: She has a normal mood and affect. Her behavior is normal. Judgment and thought content normal.  Nursing note and vitals reviewed.      Patient has been counseled extensively about nutrition and exercise as well as the importance of adherence with medications and regular follow-up. The patient was given clear instructions to go to ER or return to medical center if symptoms don't improve, worsen or new problems develop. The patient verbalized understanding.   Follow-up: Return in about 6 months (around 07/18/2018) for Physical and F/U HTN.   Gildardo Pounds, FNP-BC [place] [facility] [place], [place]   01/22/2018, 9:45 PM

## 2018-01-16 NOTE — Patient Instructions (Signed)
Omega 3 2000mg  daily   Calcium 1200mg /Vitamin D 800mg  daily    Calcium; Vitamin D oral tablets What is this medicine? CALCIUM; VITAMIN D (KAL see um; VYE ta min D) is a vitamin supplement. It is used to prevent conditions of low calcium and vitamin D. This medicine may be used for other purposes; ask your health care provider or pharmacist if you have questions. COMMON BRAND NAME(S): Calcarb 600 with Vitamin D, Calcet Plus Vitamin D, Calcitrate + D, Calcium Citrate + D3 Maximum, Calcium Citrate Maximum with D, Caltrate, Caltrate 600+D, Citracal + D, Citracal MAXIMUM + D, Citracal Petites with Vitamin D, Citrus Calcium Plus D, OSCAL 500 + D, OSCAL Calcium + D3, OSCAL Extra D3, Osteo-Poretical, Oysco 500 + D, Oysco D, Oystercal-D Calcium What should I tell my health care provider before I take this medicine? They need to know if you have any of these conditions: -constipation -dehydration -heart disease -high level of calcium or vitamin D in the blood -high level of phosphate in the blood -kidney disease -kidney stones -liver disease -parathyroid disease -sarcoidosis -stomach ulcer or obstruction -an unusual or allergic reaction to calcium, vitamin D, tartrazine dye, other medicines, foods, dyes, or preservatives -pregnant or trying to get pregnant -breast-feeding How should I use this medicine? Take this medicine by mouth with a glass of water. Follow the directions on the label. Take with food or within 1 hour after a meal. Take your medicine at regular intervals. Do not take your medicine more often than directed. Talk to your pediatrician regarding the use of this medicine in children. While this medicine may be used in children for selected conditions, precautions do apply. Overdosage: If you think you have taken too much of this medicine contact a poison control center or emergency room at once. NOTE: This medicine is only for you. Do not share this medicine with others. What if  I miss a dose? If you miss a dose, take it as soon as you can. If it is almost time for your next dose, take only that dose. Do not take double or extra doses. What may interact with this medicine? Do not take this medicine with any of the following medications: -ammonium chloride -methenamine This medicine may also interact with the following medications: -antibiotics like ciprofloxacin, gatifloxacin, tetracycline -captopril -delavirdine -diuretics -gabapentin -iron supplements -medicines for fungal infections like ketoconazole and itraconazole -medicines for seizures like ethotoin and phenytoin -mineral oil -mycophenolate -other vitamins with calcium, vitamin D, or minerals -quinidine -rosuvastatin -sucralfate -thyroid medicine This list may not describe all possible interactions. Give your health care provider a list of all the medicines, herbs, non-prescription drugs, or dietary supplements you use. Also tell them if you smoke, drink alcohol, or use illegal drugs. Some items may interact with your medicine. What should I watch for while using this medicine? Taking this medicine is not a substitute for a well-balanced diet and exercise. Talk with your doctor or health care provider and follow a healthy lifestyle. Do not take this medicine with high-fiber foods, large amounts of alcohol, or drinks containing caffeine. Do not take this medicine within 2 hours of any other medicines. What side effects may I notice from receiving this medicine? Side effects that you should report to your doctor or health care professional as soon as possible: -allergic reactions like skin rash, itching or hives, swelling of the face, lips, or tongue -confusion -dry mouth -high blood pressure -increased hunger or thirst -increased urination -irregular heartbeat -metallic taste -  muscle or bone pain -pain when urinating -seizure -unusually weak or tired -weight loss Side effects that usually do  not require medical attention (report to your doctor or health care professional if they continue or are bothersome): -constipation -diarrhea -headache -loss of appetite -nausea, vomiting -stomach upset This list may not describe all possible side effects. Call your doctor for medical advice about side effects. You may report side effects to FDA at 1-800-FDA-1088. Where should I keep my medicine? Keep out of the reach of children. Store at room temperature between 15 and 30 degrees C (59 and 86 degrees F). Protect from light. Keep container tightly closed. Throw away any unused medicine after the expiration date. NOTE: This sheet is a summary. It may not cover all possible information. If you have questions about this medicine, talk to your doctor, pharmacist, or health care provider.  2018 Elsevier/Gold Standard (2007-11-14 17:56:23) Fish Oil, Omega-3 Fatty Acids capsules (OTC) What is this medicine? FISH OIL, OMEGA-3 FATTY ACIDS (Fish Oil, oh MAY ga - 3 fatty AS ids) are essential fats. It is promoted to help support a healthy heart. This dietary supplement is used to add to a healthy diet. The FDA has not approved this supplement for any medical use. This supplement may be used for other purposes; ask your health care provider or pharmacist if you have questions. This medicine may be used for other purposes; ask your health care provider or pharmacist if you have questions. COMMON BRAND NAME(S): [employer], [employer] Nutritionals Omega-3 1450, Ocean Blue Omega, [employer] Omega-3 2100, Omega-3, Omega-3 [place], Ovega-3, [place], TherOmega What should I tell my health care provider before I take this medicine? They need to know if you have any of these conditions -bleeding problems -lung or breathing disease, like asthma -an unusual or allergic reaction to fish oil, omega-3 fatty acids, fish, other medicines, foods, dyes, or preservatives -pregnant or trying to get  pregnant -breast-feeding How should I use this medicine? Take this medicine by mouth with a glass of water. Follow the directions on the package or prescription label. Take with food. Take your medicine at regular intervals. Do not take your medicine more often than directed. Talk to your pediatrician regarding the use of this medicine in children. Special care may be needed. This medicine should not be used in children without a doctor's advice. Overdosage: If you think you have taken too much of this medicine contact a poison control center or emergency room at once. NOTE: This medicine is only for you. Do not share this medicine with others. What if I miss a dose? If you miss a dose, take it as soon as you can. If it is almost time for your next dose, take only that dose. Do not take double or extra doses. What may interact with this medicine? -aspirin and aspirin-like medicines -herbal products like danshen, dong quai, garlic pills, ginger, ginkgo biloba, horse chestnut, willow bark, and others -medicines that treat or prevent blood clots like enoxaparin, heparin, warfarin This list may not describe all possible interactions. Give your health care provider a list of all the medicines, herbs, non-prescription drugs, or dietary supplements you use. Also tell them if you smoke, drink alcohol, or use illegal drugs. Some items may interact with your medicine. What should I watch for while using this medicine? Follow a good diet and exercise plan. Taking a dietary supplement does not replace a healthy lifestyle. Some foods that have omega-3 fatty acids naturally are  fatty fish like albacore tuna, halibut, herring, mackerel, lake trout, salmon, and sardines. Too much of this supplement can be unsafe. Talk to your doctor or health care provider about how much of this supplement is right for you. If you are scheduled for any medical or dental procedure, tell your healthcare provider that you are taking  this medicine. You may need to stop taking this medicine before the procedure. Herbal or dietary supplements are not regulated like medicines. Rigid quality control standards are not required for dietary supplements. The purity and strength of these products can vary. The safety and effect of this dietary supplement for a certain disease or illness is not well known. This product is not intended to diagnose, treat, cure or prevent any disease. The Food and Drug Administration suggests the following to help consumers protect themselves: -Always read product labels and follow directions. -Natural does not mean a product is safe for humans to take. -Look for products that include USP after the ingredient name. This means that the manufacturer followed the standards of the [place] Pharmacopoeia. -Supplements made or sold by a nationally known food or drug company are more likely to be made under tight controls. You can write to the company for more information about how the product was made. What side effects may I notice from receiving this medicine? Side effects that you should report to your doctor or health care professional as soon as possible: -allergic reactions like skin rash, itching or hives, swelling of the face, lips, or tongue -breathing problems -changes in your moods or emotions -unusual bleeding or bruising Side effects that usually do not require medical attention (report to your doctor or health care professional if they continue or are bothersome): -bad or fishy breath -belching -diarrhea -nausea -stomach gas, upset -weight gain This list may not describe all possible side effects. Call your doctor for medical advice about side effects. You may report side effects to FDA at 1-800-FDA-1088. Where should I keep my medicine? Keep out of the reach of children. Store at room temperature or as directed on the package label. Protect from moisture. Do not freeze. Throw away any unused  medicine after the expiration date. NOTE: This sheet is a summary. It may not cover all possible information. If you have questions about this medicine, talk to your doctor, pharmacist, or health care provider.  2018 Elsevier/Gold Standard (2014-11-20 09:36:32)

## 2018-01-17 ENCOUNTER — Telehealth: Payer: Self-pay | Admitting: Nurse Practitioner

## 2018-01-17 LAB — URINALYSIS, COMPLETE
Bilirubin, UA: NEGATIVE
Glucose, UA: NEGATIVE
Ketones, UA: NEGATIVE
Nitrite, UA: NEGATIVE
PROTEIN UA: NEGATIVE
RBC, UA: NEGATIVE
Specific Gravity, UA: 1.01 (ref 1.005–1.030)
Urobilinogen, Ur: 0.2 mg/dL (ref 0.2–1.0)
pH, UA: 6 (ref 5.0–7.5)

## 2018-01-17 LAB — CBC
HEMATOCRIT: 41 % (ref 34.0–46.6)
Hemoglobin: 13.8 g/dL (ref 11.1–15.9)
MCH: 31.5 pg (ref 26.6–33.0)
MCHC: 33.7 g/dL (ref 31.5–35.7)
MCV: 94 fL (ref 79–97)
Platelets: 294 10*3/uL (ref 150–450)
RBC: 4.38 x10E6/uL (ref 3.77–5.28)
RDW: 12.9 % (ref 12.3–15.4)
WBC: 8 10*3/uL (ref 3.4–10.8)

## 2018-01-17 LAB — CMP14+EGFR
A/G RATIO: 1.2 (ref 1.2–2.2)
ALT: 22 IU/L (ref 0–32)
AST: 21 IU/L (ref 0–40)
Albumin: 4.4 g/dL (ref 3.6–4.8)
Alkaline Phosphatase: 114 IU/L (ref 39–117)
BUN / CREAT RATIO: 27 (ref 12–28)
BUN: 14 mg/dL (ref 8–27)
Bilirubin Total: 0.4 mg/dL (ref 0.0–1.2)
CALCIUM: 9.4 mg/dL (ref 8.7–10.3)
CO2: 23 mmol/L (ref 20–29)
Chloride: 98 mmol/L (ref 96–106)
Creatinine, Ser: 0.52 mg/dL — ABNORMAL LOW (ref 0.57–1.00)
GFR, EST AFRICAN AMERICAN: 113 mL/min/{1.73_m2} (ref >59)
GFR, EST NON AFRICAN AMERICAN: 98 mL/min/{1.73_m2} (ref >59)
GLOBULIN, TOTAL: 3.8 g/dL (ref 1.5–4.5)
Glucose: 90 mg/dL (ref 65–99)
POTASSIUM: 3.8 mmol/L (ref 3.5–5.2)
SODIUM: 137 mmol/L (ref 134–144)
Total Protein: 8.2 g/dL (ref 6.0–8.5)

## 2018-01-17 LAB — LIPID PANEL
CHOL/HDL RATIO: 4.1 ratio (ref 0.0–4.4)
Cholesterol, Total: 198 mg/dL (ref 100–199)
HDL: 48 mg/dL (ref >39)
LDL Calculated: 123 mg/dL — ABNORMAL HIGH (ref 0–99)
Triglycerides: 137 mg/dL (ref 0–149)
VLDL Cholesterol Cal: 27 mg/dL (ref 5–40)

## 2018-01-17 LAB — THYROID PANEL WITH TSH
FREE THYROXINE INDEX: 2 (ref 1.2–4.9)
T3 UPTAKE RATIO: 20 % — AB (ref 24–39)
T4 TOTAL: 9.9 ug/dL (ref 4.5–12.0)
TSH: 4.54 u[IU]/mL — AB (ref 0.450–4.500)

## 2018-01-17 LAB — MICROSCOPIC EXAMINATION: Casts: NONE SEEN /lpf

## 2018-01-17 NOTE — Telephone Encounter (Signed)
Will route to PCP 

## 2018-01-17 NOTE — Telephone Encounter (Signed)
Patient called stating that she was prescribed sulfamethoxazole-trimethoprim (BACTRIM DS,SEPTRA DS) 800-160 MG tablet. Patient states she is allergic to the medication but has taken it before. She would like to speak with her nurse about the medication. Please f/u with patient.

## 2018-01-18 NOTE — Telephone Encounter (Signed)
Patient has an allergy to sulfa but has taken the medication recently with no symptoms.

## 2018-01-20 LAB — CULTURE, URINE COMPREHENSIVE

## 2018-01-22 ENCOUNTER — Encounter: Payer: Self-pay | Admitting: Nurse Practitioner

## 2018-01-22 NOTE — Telephone Encounter (Signed)
CMA spoke to patient to inform PCP response.  Pt understood and stated she took benadryl before she took the Sulfa and it worked.

## 2018-01-23 NOTE — Telephone Encounter (Signed)
-----   Message from Gildardo Pounds, NP sent at 01/22/2018 10:31 PM EDT ----- Urine culture showing Ecoli. Please make sure you wipe from front to back after every urination. The antibiotic you were given will treat this infection.

## 2018-01-23 NOTE — Telephone Encounter (Signed)
CMA spoke to patient to inform on urine culture results and advising.  Patient understood.

## 2018-03-14 ENCOUNTER — Ambulatory Visit
Admission: RE | Admit: 2018-03-14 | Discharge: 2018-03-14 | Disposition: A | Discharge status: Home / Self Care | Payer: PPO | Source: Ambulatory Visit | Attending: Nurse Practitioner | Admitting: Nurse Practitioner

## 2018-03-14 ENCOUNTER — Other Ambulatory Visit: Payer: Self-pay | Admitting: Nurse Practitioner

## 2018-03-14 DIAGNOSIS — Z1231 Encounter for screening mammogram for malignant neoplasm of breast: Secondary | ICD-10-CM

## 2018-03-14 DIAGNOSIS — Z780 Asymptomatic menopausal state: Secondary | ICD-10-CM | POA: Diagnosis not present

## 2018-03-14 DIAGNOSIS — M810 Age-related osteoporosis without current pathological fracture: Secondary | ICD-10-CM | POA: Diagnosis not present

## 2018-03-14 DIAGNOSIS — E2839 Other primary ovarian failure: Secondary | ICD-10-CM

## 2018-03-15 ENCOUNTER — Telehealth: Payer: Self-pay

## 2018-03-15 ENCOUNTER — Other Ambulatory Visit: Payer: Self-pay | Admitting: Nurse Practitioner

## 2018-03-15 ENCOUNTER — Telehealth: Payer: Self-pay | Admitting: Nurse Practitioner

## 2018-03-15 DIAGNOSIS — R921 Mammographic calcification found on diagnostic imaging of breast: Secondary | ICD-10-CM

## 2018-03-15 NOTE — Telephone Encounter (Signed)
CMA attempt to call patient. No answer and left a VM to call back.

## 2018-03-15 NOTE — Telephone Encounter (Signed)
-----   Message from Gildardo Pounds, NP sent at 03/14/2018 10:20 PM EDT ----- Mammogram abnormal. The breast center will be contacting your for further recommendations

## 2018-03-15 NOTE — Telephone Encounter (Signed)
CMA attempt to call patient to inform on mammogram and bone density scan results.  No answer and left a VM for patient to call back.  If patient call back, please inform:  Mammogram abnormal. The breast center will be contacting your for further recommendations  Your Bone Density shows Osteoporosis. I will be referring you to orthopedics for further recommendations  A letter will be send out.

## 2018-03-15 NOTE — Telephone Encounter (Signed)
Jodell Zarr called and was given the note left by her nurse,she would like a call back when possible she will leave town tomorrow at [place] follow up

## 2018-03-15 NOTE — Telephone Encounter (Signed)
-----   Message from Gildardo Pounds, NP sent at 03/14/2018 10:17 PM EDT ----- Your Bone Density shows Osteoporosis. I will be referring you to orthopedics for further recommendations

## 2018-03-19 ENCOUNTER — Telehealth: Payer: Self-pay | Admitting: Nurse Practitioner

## 2018-03-19 NOTE — Telephone Encounter (Signed)
Both spine and hip. Thanks

## 2018-03-19 NOTE — Telephone Encounter (Signed)
Noted  

## 2018-03-19 NOTE — Telephone Encounter (Signed)
Patient call me because [facility] call her to let her know that before they schedule her appointment they need to know which part of her body the patient has osteoporosis . Please, advise Thank You .

## 2018-03-19 NOTE — Telephone Encounter (Signed)
Will route to PCP 

## 2018-03-21 ENCOUNTER — Ambulatory Visit
Admission: RE | Admit: 2018-03-21 | Discharge: 2018-03-21 | Disposition: A | Discharge status: Home / Self Care | Payer: PPO | Source: Ambulatory Visit | Attending: Nurse Practitioner | Admitting: Nurse Practitioner

## 2018-03-21 DIAGNOSIS — R921 Mammographic calcification found on diagnostic imaging of breast: Secondary | ICD-10-CM | POA: Diagnosis not present

## 2018-05-09 DIAGNOSIS — C50911 Malignant neoplasm of unspecified site of right female breast: Secondary | ICD-10-CM | POA: Diagnosis not present

## 2018-06-23 ENCOUNTER — Other Ambulatory Visit: Payer: Self-pay | Admitting: Internal Medicine

## 2018-06-23 DIAGNOSIS — I10 Essential (primary) hypertension: Secondary | ICD-10-CM

## 2019-01-02 ENCOUNTER — Other Ambulatory Visit: Payer: Self-pay

## 2019-01-02 ENCOUNTER — Ambulatory Visit: Payer: PPO | Attending: Nurse Practitioner | Admitting: Nurse Practitioner

## 2019-01-02 ENCOUNTER — Encounter: Payer: Self-pay | Admitting: Nurse Practitioner

## 2019-01-02 DIAGNOSIS — M810 Age-related osteoporosis without current pathological fracture: Secondary | ICD-10-CM | POA: Diagnosis not present

## 2019-01-02 DIAGNOSIS — I10 Essential (primary) hypertension: Secondary | ICD-10-CM

## 2019-01-02 MED ORDER — IRBESARTAN 300 MG PO TABS: 300.0000 mg | ORAL_TABLET | Freq: Every day | ORAL | 1 refills | Status: DC

## 2019-01-02 MED ORDER — HYDROCHLOROTHIAZIDE 12.5 MG PO CAPS: 12.5000 mg | ORAL_CAPSULE | Freq: Every day | ORAL | 1 refills | Status: DC

## 2019-01-02 MED ORDER — AMLODIPINE BESYLATE 10 MG PO TABS: 10.0000 mg | ORAL_TABLET | Freq: Every day | ORAL | 1 refills | Status: DC

## 2019-01-02 NOTE — Progress Notes (Signed)
Virtual Visit via [facility] Due to national recommendations of social distancing due to COVID 19, telehealth/WEBEX visit is felt to be most appropriate for this patient at this time.  I discussed the limitations, risks, security and privacy concerns of performing an evaluation and management service by telephone and the availability of in person appointments. I also discussed with the patient that there may be a patient responsible charge related to this service. The patient expressed understanding and agreed to proceed.    I connected with Nathanial Rancher on 01/02/19  at   1:30 PM EDT  EDT by telephone and verified that I am speaking with the correct person using two identifiers. I verified that she was the only person in the room attending the meeting today.    Consent I discussed the limitations, risks, security and privacy concerns of performing an evaluation and management service by telephone and the availability of in person appointments. I also discussed with the patient that there may be a patient responsible charge related to this service. The patient expressed understanding and agreed to proceed.   Location of Patient: Private Residence   Location of Provider: [place] and [employer] Office    Persons participating in Telemedicine visit: [name] FNP-BC [place]    History of Present Illness: [place] visit for: ESSENTIAL HYPERTENSION She has a history of osteoporosis. Declines biophosphonates. Taking 1200 calcium and 800 vitamin D daily. Denies any recent falls.  Past Medical History:  Diagnosis Date  . Hepatitis C   . Hypertension   . Stroke ([place])       ESSENTIAL HYPERTENSION She is monitoring her blood pressure at home daily with average readings 120-130s over 70 to 80s.  Today's reading during visit 134/70. She is medication compliant taking amlodipine 10 mg daily, hydrochlorothiazide 12.5 mg daily and irbesartan 300 mg daily.  Diet  compliant in regards to DASH diet.  Denies chest pain, shortness of breath, palpitations, lightheadedness, dizziness, headaches or BLE edema.  Past Surgical History:  Procedure Laterality Date  . KNEE SURGERY  2005  . LEG SURGERY     right leg, a rod.  Marland Kitchen MASTECTOMY Right   . WRIST SURGERY  2008    Family History  Problem Relation Age of Onset  . Heart disease Father   . Breast cancer Neg Hx     Social History   Socioeconomic History  . Marital status: Single    Spouse name: Not on file  . Number of children: Not on file  . Years of education: Not on file  . Highest education level: Not on file  Occupational History  . Not on file  Social Needs  . Financial resource strain: Not on file  . Food insecurity:    Worry: Not on file    Inability: Not on file  . Transportation needs:    Medical: Not on file    Non-medical: Not on file  Tobacco Use  . Smoking status: Former Smoker    Types: Cigarettes    Last attempt to quit: 02/14/2011    Years since quitting: 7.8  . Smokeless tobacco: Never Used  Substance and Sexual Activity  . Alcohol use: No  . Drug use: No  . Sexual activity: Not Currently  Lifestyle  . Physical activity:    Days per week: Not on file    Minutes per session: Not on file  . Stress: Not on file  Relationships  . Social connections:  Talks on phone: Not on file    Gets together: Not on file    Attends religious service: Not on file    Active member of club or organization: Not on file    Attends meetings of clubs or organizations: Not on file    Relationship status: Not on file  Other Topics Concern  . Not on file  Social History Narrative  . Not on file     Observations/Objective: Physical Exam  Constitutional: She is oriented to person, place, and time and well-developed, well-nourished, and in no distress.  HENT:  Head: Normocephalic.  Mouth/Throat: Abnormal dentition (partial edentulous; lower incisors).  Eyes: EOM are normal.   Pulmonary/Chest: No respiratory distress.  Neurological: She is alert and oriented to person, place, and time.     Review of Systems  Constitutional: Negative for fever, malaise/fatigue and weight loss.  HENT: Negative.  Negative for nosebleeds.   Eyes: Negative.  Negative for blurred vision, double vision and photophobia.  Respiratory: Negative.  Negative for cough and shortness of breath.   Cardiovascular: Negative.  Negative for chest pain, palpitations and leg swelling.  Gastrointestinal: Negative.  Negative for heartburn, nausea and vomiting.  Musculoskeletal: Negative.  Negative for myalgias.  Neurological: Negative.  Negative for dizziness, focal weakness, seizures and headaches.  Psychiatric/Behavioral: Negative.  Negative for suicidal ideas.    Assessment and Plan: Day was seen today for hypertension.  Diagnoses and all orders for this visit:  Essential hypertension, benign -     amLODipine (NORVASC) 10 MG tablet; Take 1 tablet (10 mg total) by mouth daily. -     hydrochlorothiazide (MICROZIDE) 12.5 MG capsule; Take 1 capsule (12.5 mg total) by mouth daily. -     irbesartan (AVAPRO) 300 MG tablet; Take 1 tablet (300 mg total) by mouth at bedtime. Continue all antihypertensives as prescribed.  Remember to bring in your blood pressure log with you for your follow up appointment.  DASH/Mediterranean Diets are healthier choices for HTN.  CBC, BMP, LIPIDS, Vitamin D pending  Follow Up Instructions Return in about 3 months (around 04/04/2019).     I discussed the assessment and treatment plan with the patient. The patient was provided an opportunity to ask questions and all were answered. The patient agreed with the plan and demonstrated an understanding of the instructions.   The patient was advised to call back or seek an in-person evaluation if the symptoms worsen or if the condition fails to improve as anticipated.  I provided 24 minutes of non-face-to-face time during  this encounter including median intraservice time, reviewing previous notes, labs, imaging, medications and explaining diagnosis and management.  Gildardo Pounds, FNP-BC

## 2019-01-09 ENCOUNTER — Other Ambulatory Visit: Payer: Self-pay

## 2019-01-09 ENCOUNTER — Ambulatory Visit: Payer: PPO | Attending: Nurse Practitioner

## 2019-01-09 DIAGNOSIS — M810 Age-related osteoporosis without current pathological fracture: Secondary | ICD-10-CM | POA: Diagnosis not present

## 2019-01-09 DIAGNOSIS — I10 Essential (primary) hypertension: Secondary | ICD-10-CM | POA: Diagnosis not present

## 2019-01-10 LAB — CMP14+EGFR
ALT: 23 IU/L (ref 0–32)
AST: 23 IU/L (ref 0–40)
Albumin/Globulin Ratio: 1.4 (ref 1.2–2.2)
Albumin: 4.2 g/dL (ref 3.8–4.8)
Alkaline Phosphatase: 90 IU/L (ref 39–117)
BUN/Creatinine Ratio: 20 (ref 12–28)
BUN: 14 mg/dL (ref 8–27)
Bilirubin Total: 0.3 mg/dL (ref 0.0–1.2)
CO2: 23 mmol/L (ref 20–29)
Calcium: 9.9 mg/dL (ref 8.7–10.3)
Chloride: 102 mmol/L (ref 96–106)
Creatinine, Ser: 0.71 mg/dL (ref 0.57–1.00)
GFR calc Af Amer: 100 mL/min/{1.73_m2} (ref >59)
GFR calc non Af Amer: 87 mL/min/{1.73_m2} (ref >59)
Globulin, Total: 3 g/dL (ref 1.5–4.5)
Glucose: 99 mg/dL (ref 65–99)
Potassium: 4.4 mmol/L (ref 3.5–5.2)
Sodium: 141 mmol/L (ref 134–144)
Total Protein: 7.2 g/dL (ref 6.0–8.5)

## 2019-01-10 LAB — CBC
Hematocrit: 39.4 % (ref 34.0–46.6)
Hemoglobin: 13.7 g/dL (ref 11.1–15.9)
MCH: 31.7 pg (ref 26.6–33.0)
MCHC: 34.8 g/dL (ref 31.5–35.7)
MCV: 91 fL (ref 79–97)
Platelets: 230 10*3/uL (ref 150–450)
RBC: 4.32 x10E6/uL (ref 3.77–5.28)
RDW: 11.7 % (ref 11.7–15.4)
WBC: 6.7 10*3/uL (ref 3.4–10.8)

## 2019-01-10 LAB — LIPID PANEL
Chol/HDL Ratio: 3.9 ratio (ref 0.0–4.4)
Cholesterol, Total: 191 mg/dL (ref 100–199)
HDL: 49 mg/dL (ref >39)
LDL Calculated: 115 mg/dL — ABNORMAL HIGH (ref 0–99)
Triglycerides: 134 mg/dL (ref 0–149)
VLDL Cholesterol Cal: 27 mg/dL (ref 5–40)

## 2019-01-10 LAB — VITAMIN D 25 HYDROXY (VIT D DEFICIENCY, FRACTURES): Vit D, 25-Hydroxy: 38.2 ng/mL (ref 30.0–100.0)

## 2019-01-15 ENCOUNTER — Other Ambulatory Visit: Payer: Self-pay | Admitting: Nurse Practitioner

## 2019-01-15 MED ORDER — ALENDRONATE SODIUM 35 MG PO TABS: 70.0000 mg | ORAL_TABLET | ORAL | 2 refills | Status: AC

## 2019-01-18 NOTE — Progress Notes (Signed)
CMA spoke to patient to inform on lab results. Pt. Is aware of Rx sent to the pharmacy.  Pt. Verified DOB. Pt. Understood.

## 2019-03-14 ENCOUNTER — Telehealth: Payer: Self-pay | Admitting: *Deleted

## 2019-03-14 ENCOUNTER — Telehealth: Payer: Self-pay | Admitting: Nurse Practitioner

## 2019-03-14 NOTE — Telephone Encounter (Signed)
Nia, NP with [employer] calling to give patient PCP updates about patient. Per Nia she did a P.A.D on patient and her results showed significant deficit.   Left is 0.48 Right is 0.26  Per Nia, unless provider don't having any further questions, she do not have to call her back. Nia number is [phone].

## 2019-03-14 NOTE — Telephone Encounter (Signed)
Pt would like her nurse call her back in regards to her new medications, please follow up

## 2019-03-15 ENCOUNTER — Other Ambulatory Visit: Payer: PPO

## 2019-03-15 NOTE — Telephone Encounter (Signed)
Pt states she does not mean to be non compliant but was prescribed Fosamax. She does not feel comfortable with taking this medication because h/o Hep C She is taking Calcium with VitD.  Pharmacist at the drug stored advised her to take it tid.  [facility] [employer] had a home visit with patient. She informed of poor circulation in feet. Pt states she does not have cold feet, numbness or tingling, or open wounds in her feet. They would be sending out a report to inform PCP.

## 2019-03-16 ENCOUNTER — Other Ambulatory Visit: Payer: Self-pay | Admitting: Nurse Practitioner

## 2019-03-16 DIAGNOSIS — R6889 Other general symptoms and signs: Secondary | ICD-10-CM

## 2019-03-16 NOTE — Telephone Encounter (Signed)
Noted  

## 2019-03-16 NOTE — Telephone Encounter (Signed)
Referral sent for vascular specialist

## 2019-03-18 NOTE — Telephone Encounter (Signed)
noted 

## 2019-05-16 ENCOUNTER — Other Ambulatory Visit: Payer: Self-pay

## 2019-05-16 DIAGNOSIS — M79604 Pain in right leg: Secondary | ICD-10-CM

## 2019-05-21 ENCOUNTER — Encounter: Payer: Self-pay | Admitting: Vascular Surgery

## 2019-05-21 ENCOUNTER — Ambulatory Visit (HOSPITAL_COMMUNITY)
Admission: RE | Admit: 2019-05-21 | Discharge: 2019-05-21 | Disposition: A | Discharge status: Home / Self Care | Payer: PPO | Source: Ambulatory Visit | Attending: Family | Admitting: Family

## 2019-05-21 ENCOUNTER — Other Ambulatory Visit: Payer: Self-pay

## 2019-05-21 ENCOUNTER — Ambulatory Visit: Payer: PPO | Admitting: Vascular Surgery

## 2019-05-21 DIAGNOSIS — I739 Peripheral vascular disease, unspecified: Secondary | ICD-10-CM | POA: Diagnosis not present

## 2019-05-21 DIAGNOSIS — M79605 Pain in left leg: Secondary | ICD-10-CM | POA: Insufficient documentation

## 2019-05-21 DIAGNOSIS — M79604 Pain in right leg: Secondary | ICD-10-CM | POA: Insufficient documentation

## 2019-05-21 NOTE — Progress Notes (Signed)
Patient name: Michelle Sosa MRN: [id number] DOB: 30-Apr-1946 Sex: female  REASON FOR CONSULT: Positive PAD screen  HPI: Michelle Sosa is a 73 y.o. female, with history of hypertension, previous stroke with some left leg weakness, and hepatitis C that presents for evaluation of PAD after a positive screen with her insurance company.  Patient states she has no symptoms in her legs.  A home health nurse came out and evaluated the flow in her feet and told her that it was abnormal and she needed to see a vascular surgeon.  She denies any claudication symptoms in her lower extremities when she walks with a cane.  Specifically no pain in calf or thigh.  She has no rest pain at night or other discomfort in the feet that awaken her.  She has no tissue loss or nonhealing wounds.  She is obviously very nervous about the appointment today after finding out that she had abnormal blood flow.  Denies tobacco abuse.  Past Medical History:  Diagnosis Date  . Hepatitis C   . Hypertension   . Stroke [facility])     Past Surgical History:  Procedure Laterality Date  . KNEE SURGERY  2005  . LEG SURGERY     right leg, a rod.  Marland Kitchen MASTECTOMY Right   . WRIST SURGERY  2008    Family History  Problem Relation Age of Onset  . Heart disease Father   . Breast cancer Neg Hx     SOCIAL HISTORY: Social History   Socioeconomic History  . Marital status: Single    Spouse name: Not on file  . Number of children: Not on file  . Years of education: Not on file  . Highest education level: Not on file  Occupational History  . Not on file  Social Needs  . Financial resource strain: Not on file  . Food insecurity    Worry: Not on file    Inability: Not on file  . Transportation needs    Medical: Not on file    Non-medical: Not on file  Tobacco Use  . Smoking status: Former Smoker    Types: Cigarettes    Quit date: 02/14/2011    Years since quitting: 8.2  . Smokeless tobacco: Never Used  Substance and  Sexual Activity  . Alcohol use: No  . Drug use: No  . Sexual activity: Not Currently  Lifestyle  . Physical activity    Days per week: Not on file    Minutes per session: Not on file  . Stress: Not on file  Relationships  . Social Herbalist on phone: Not on file    Gets together: Not on file    Attends religious service: Not on file    Active member of club or organization: Not on file    Attends meetings of clubs or organizations: Not on file    Relationship status: Not on file  . Intimate partner violence    Fear of current or ex partner: Not on file    Emotionally abused: Not on file    Physically abused: Not on file    Forced sexual activity: Not on file  Other Topics Concern  . Not on file  Social History Narrative  . Not on file    Allergies  Allergen Reactions  . Codeine   . Penicillins   . Sulfa Antibiotics     Current Outpatient Medications  Medication Sig Dispense Refill  . aspirin  81 MG EC tablet Take 1 tablet (81 mg total) by mouth daily. Swallow whole. 30 tablet 12  . irbesartan (AVAPRO) 300 MG tablet Take 1 tablet (300 mg total) by mouth at bedtime. 90 tablet 1  . multivitamin-iron-minerals-folic acid (CENTRUM) chewable tablet Chew 1 tablet by mouth daily.    Marland Kitchen thiamine (VITAMIN B-1) 100 MG tablet Take 100 mg by mouth daily.    . vitamin B-12 (CYANOCOBALAMIN) 100 MCG tablet Take 100 mcg by mouth daily.    Marland Kitchen amLODipine (NORVASC) 10 MG tablet Take 1 tablet (10 mg total) by mouth daily. 90 tablet 1  . hydrochlorothiazide (MICROZIDE) 12.5 MG capsule Take 1 capsule (12.5 mg total) by mouth daily. 90 capsule 1   No current facility-administered medications for this visit.     REVIEW OF SYSTEMS:  [X]  denotes positive finding, [ ]  denotes negative finding Cardiac  Comments:  Chest pain or chest pressure:    Shortness of breath upon exertion:    Short of breath when lying flat:    Irregular heart rhythm:        Vascular    Pain in calf, thigh,  or hip brought on by ambulation:    Pain in feet at night that wakes you up from your sleep:     Blood clot in your veins:    Leg swelling:         Pulmonary    Oxygen at home:    Productive cough:     Wheezing:         Neurologic    Sudden weakness in arms or legs:     Sudden numbness in arms or legs:     Sudden onset of difficulty speaking or slurred speech:    Temporary loss of vision in one eye:     Problems with dizziness:         Gastrointestinal    Blood in stool:     Vomited blood:         Genitourinary    Burning when urinating:     Blood in urine:        Psychiatric    Major depression:         Hematologic    Bleeding problems:    Problems with blood clotting too easily:        Skin    Rashes or ulcers:        Constitutional    Fever or chills:      PHYSICAL EXAM: Vitals:   05/21/19 0944  BP: (!) 166/79  Pulse: 81  Resp: 16  Temp: 97.9 F (36.6 C)  TempSrc: Temporal  SpO2: 97%  Weight: 164 lb (74.4 kg)  Height: 5\' 3"  (1.6 m)    GENERAL: The patient is a well-nourished female, in no acute distress. The vital signs are documented above. CARDIAC: There is a regular rate and rhythm.  VASCULAR:  2+ radial pulse palpable bilaterally 2+ femoral pulse palpable bilateral Right dorsalis pedis palpable, no other pedal pulses palpable No tissue loss Feet are warm PULMONARY: There is good air exchange bilaterally without wheezing or rales. ABDOMEN: Soft and non-tender with normal pitched bowel sounds.  MUSCULOSKELETAL: There are no major deformities or cyanosis. NEUROLOGIC: No focal weakness or paresthesias are detected.   DATA:   I independently reviewed her ABIs which were 0.79 on the right and 0.58 on the left.  She has a triphasic waveform at the right ankle.  Biphasic and monophasic waveforms at the left ankle.  Assessment/Plan:  73 year old female who presents for evaluation of PAD after a abnormal home health screen.  Discussed with patient  in detail that based on her ABIs today she certainly has mild to moderate PAD.  That being said she is completely asymptomatic and has no claudication, rest pain or tissue loss that would warrant any surgical intervention at this time.  She actually has a palpable dorsalis pedis in the right foot and I cannot palpate any pulses in the left foot so certainly more advanced disease on the left.  Discussed that at this time would just recommend risk reduction including aspirin daily as well as no smoking exercise therapy and statin if she can tolerate it.  Rather than offering her scheduled follow-up, I discussed that she contact her office in the future if she develops any new symptoms in her legs and we would be happy to reevaluate.  Offered her reassurance at this time based on the fact that she is asymptomatic.   Marty Heck, MD [facility] Office: [phone] Pager: [phone]

## 2019-09-02 ENCOUNTER — Other Ambulatory Visit: Payer: Self-pay | Admitting: Nurse Practitioner

## 2019-09-02 ENCOUNTER — Other Ambulatory Visit: Payer: Self-pay | Admitting: Family Medicine

## 2019-09-02 DIAGNOSIS — I10 Essential (primary) hypertension: Secondary | ICD-10-CM

## 2019-09-23 ENCOUNTER — Other Ambulatory Visit: Payer: Self-pay

## 2019-09-23 ENCOUNTER — Encounter: Payer: Self-pay | Admitting: Nurse Practitioner

## 2019-09-23 ENCOUNTER — Ambulatory Visit: Payer: PPO | Attending: Internal Medicine

## 2019-09-23 ENCOUNTER — Ambulatory Visit: Payer: PPO | Attending: Nurse Practitioner | Admitting: Nurse Practitioner

## 2019-09-23 VITALS — BP 166/75 | HR 75 | Temp 97.9°F | Ht 63.0 in | Wt 168.4 lb

## 2019-09-23 DIAGNOSIS — R300 Dysuria: Secondary | ICD-10-CM | POA: Diagnosis not present

## 2019-09-23 DIAGNOSIS — I10 Essential (primary) hypertension: Secondary | ICD-10-CM

## 2019-09-23 DIAGNOSIS — Z23 Encounter for immunization: Secondary | ICD-10-CM | POA: Insufficient documentation

## 2019-09-23 DIAGNOSIS — R7989 Other specified abnormal findings of blood chemistry: Secondary | ICD-10-CM | POA: Diagnosis not present

## 2019-09-23 DIAGNOSIS — M810 Age-related osteoporosis without current pathological fracture: Secondary | ICD-10-CM

## 2019-09-23 DIAGNOSIS — E782 Mixed hyperlipidemia: Secondary | ICD-10-CM

## 2019-09-23 DIAGNOSIS — Z1231 Encounter for screening mammogram for malignant neoplasm of breast: Secondary | ICD-10-CM

## 2019-09-23 LAB — POCT URINALYSIS DIP (CLINITEK)
Bilirubin, UA: NEGATIVE
Glucose, UA: NEGATIVE mg/dL
Ketones, POC UA: NEGATIVE mg/dL
Leukocytes, UA: NEGATIVE
Nitrite, UA: NEGATIVE
POC PROTEIN,UA: NEGATIVE
Spec Grav, UA: 1.025 (ref 1.010–1.025)
Urobilinogen, UA: 0.2 E.U./dL
pH, UA: 5.5 (ref 5.0–8.0)

## 2019-09-23 MED ORDER — HYDROCHLOROTHIAZIDE 12.5 MG PO CAPS: 12.5000 mg | ORAL_CAPSULE | Freq: Every day | ORAL | 1 refills | Status: DC

## 2019-09-23 MED ORDER — ASPIRIN 81 MG PO TBEC: 81.0000 mg | DELAYED_RELEASE_TABLET | Freq: Every day | ORAL | 3 refills | Status: AC

## 2019-09-23 MED ORDER — AMLODIPINE BESYLATE 10 MG PO TABS: 10.0000 mg | ORAL_TABLET | Freq: Every day | ORAL | 1 refills | Status: DC

## 2019-09-23 MED ORDER — IRBESARTAN 300 MG PO TABS: 300.0000 mg | ORAL_TABLET | Freq: Every day | ORAL | 1 refills | Status: DC

## 2019-09-23 NOTE — Progress Notes (Signed)
   Covid-19 Vaccination Clinic  Name:  Michelle Sosa    MRN: [id number] DOB: 1945/11/01  09/23/2019  [name] was observed post Covid-19 immunization for 15 minutes without incidence. She was provided with Vaccine Information Sheet and instruction to access the V-Safe system.   [name] was instructed to call 911 with any severe reactions post vaccine: Marland Kitchen Difficulty breathing  . Swelling of your face and throat  . A fast heartbeat  . A bad rash all over your body  . Dizziness and weakness    Immunizations Administered    Name Date Dose VIS Date Route   Pfizer COVID-19 Vaccine 09/23/2019  4:35 PM 0.3 mL 07/26/2019 Intramuscular   Manufacturer: [employer]   Lot: [id number]   [place]: [id number]

## 2019-09-23 NOTE — Progress Notes (Signed)
Assessment & Plan:  Michelle Sosa was seen today for medication refill.  Diagnoses and all orders for this visit:  Essential hypertension, benign -     amLODipine (NORVASC) 10 MG tablet; Take 1 tablet (10 mg total) by mouth daily. -     irbesartan (AVAPRO) 300 MG tablet; Take 1 tablet (300 mg total) by mouth daily. -     hydrochlorothiazide (MICROZIDE) 12.5 MG capsule; Take 1 capsule (12.5 mg total) by mouth daily. -     CMP14+EGFR Continue all antihypertensives as prescribed.  Remember to bring in your blood pressure log with you for your follow up appointment.  DASH/Mediterranean Diets are healthier choices for HTN.    Age-related osteoporosis without current pathological fracture -     VITAMIN D 25 Hydroxy (Vit-D Deficiency, Fractures) -     DG Bone Density; Future  Elevated TSH -     Thyroid Panel With TSH  Mixed hyperlipidemia -     Lipid panel Not at goal of <100.  INSTRUCTIONS: Work on a low fat, heart healthy diet and participate in regular aerobic exercise program by working out at least 150 minutes per week; 5 days a week-30 minutes per day. Avoid red meat/beef/steak,  fried foods. junk foods, sodas, sugary drinks, unhealthy snacking, alcohol and smoking.  Drink at least 80 oz of water per day and monitor your carbohydrate intake daily.   Breast cancer screening by mammogram -     Cancel: MM 3D SCREEN BREAST BILATERAL; Future -     MM 3D SCREEN BREAST UNI LEFT; Future  Dysuria -     POCT URINALYSIS DIP (CLINITEK)  Other orders -     aspirin 81 MG EC tablet; Take 1 tablet (81 mg total) by mouth daily. Swallow whole.    Patient has been counseled on age-appropriate routine health concerns for screening and prevention. These are reviewed and up-to-date. Referrals have been placed accordingly. Immunizations are up-to-date or declined.    Subjective:   Chief Complaint  Patient presents with  . Medication Refill   HPI Michelle Sosa 74 y.o. female presents to office  today for follow up.  has a past medical history of Hepatitis C, Hypertension, and Stroke (Michelle Sosa).  Osteoporosis Taking calcium and vitamin D supplements 1200 mg and 800 mg. She declines biophosphonates.     Essential Hypertension Monitoring blood pressure at home. Reports readings are averaging around 130/70s. Blood pressure is elevated here in the office. She endorses medication compliance taking amlodipine 10 mg daily, HCTZ 12. 5 mg daily land irbesartan 300 mg daily. Denies chest pain, shortness of breath, palpitations, lightheadedness, dizziness, headaches or BLE edema.  BP Readings from Last 3 Encounters:  09/23/19 (!) 166/75  05/21/19 (!) 166/79  01/16/18 (!) 156/77     Elevated TSH Mildly elevated. Will continue to monitor as she denies any symptoms of hypothyroidism. Likely age related changes.  Lab Results  Component Value Date   TSH 5.190 (H) 09/23/2019    Review of Systems  Constitutional: Negative for fever, malaise/fatigue and weight loss.  HENT: Negative.  Negative for nosebleeds.   Eyes: Negative.  Negative for blurred vision, double vision and photophobia.  Respiratory: Negative.  Negative for cough and shortness of breath.   Cardiovascular: Negative.  Negative for chest pain, palpitations and leg swelling.  Gastrointestinal: Negative.  Negative for heartburn, nausea and vomiting.  Musculoskeletal: Negative.  Negative for myalgias.  Neurological: Negative.  Negative for dizziness, focal weakness, seizures and headaches.  Psychiatric/Behavioral: Negative.  Negative for suicidal ideas.    Past Medical History:  Diagnosis Date  . Hepatitis C   . Hypertension   . Stroke [facility])     Past Surgical History:  Procedure Laterality Date  . KNEE SURGERY  2005  . LEG SURGERY     right leg, a rod.  Marland Kitchen MASTECTOMY Right   . WRIST SURGERY  2008    Family History  Problem Relation Age of Onset  . Heart disease Father   . Breast cancer Neg Hx     Social History  Reviewed with no changes to be made today.   Outpatient Medications Prior to Visit  Medication Sig Dispense Refill  . Calcium Carbonate-Vitamin D3 (CALCIUM 600-D) 600-400 MG-UNIT TABS Take by mouth.    . Fish Oil-Cholecalciferol (OMEGA-3 FISH OIL-VITAMIN D3) 1200-1000 MG-UNIT CAPS Take by mouth.    . multivitamin-iron-minerals-folic acid (CENTRUM) chewable tablet Chew 1 tablet by mouth daily.    Marland Kitchen thiamine (VITAMIN B-1) 100 MG tablet Take 100 mg by mouth daily.    . vitamin B-12 (CYANOCOBALAMIN) 100 MCG tablet Take 100 mcg by mouth daily.    Marland Kitchen amLODipine (NORVASC) 10 MG tablet Take 1 tablet (10 mg total) by mouth daily. Please make PCP appointment. 30 tablet 0  . aspirin 81 MG EC tablet Take 1 tablet (81 mg total) by mouth daily. Swallow whole. 30 tablet 12  . hydrochlorothiazide (MICROZIDE) 12.5 MG capsule Take 1 capsule (12.5 mg total) by mouth daily. Please make PCP appointment. 30 capsule 0  . irbesartan (AVAPRO) 300 MG tablet Take 1 tablet (300 mg total) by mouth daily. Please make PCP appointment. 30 tablet 0   No facility-administered medications prior to visit.    Allergies  Allergen Reactions  . Metoprolol Tartrate Other (See Comments)    Affects her mood/ lability  . Cipro [Ciprofloxacin Hcl]     Joint and muscle pain  . Codeine Other (See Comments)  . Penicillins Other (See Comments)  . Sulfa Antibiotics     States she can take Bactrim       Objective:    BP (!) 166/75 (BP Location: Left Arm, Patient Position: Sitting, Cuff Size: Normal)   Pulse 75   Temp 97.9 F (36.6 C) (Oral)   Ht '5\' 3"'$  (1.6 m)   Wt 168 lb 6.4 oz (76.4 kg)   SpO2 95%   BMI 29.83 kg/m  Wt Readings from Last 3 Encounters:  09/23/19 168 lb 6.4 oz (76.4 kg)  05/21/19 164 lb (74.4 kg)  01/16/18 157 lb 12.8 oz (71.6 kg)    Physical Exam Vitals and nursing note reviewed.  Constitutional:      Appearance: She is well-developed.  HENT:     Head: Normocephalic and atraumatic.  Cardiovascular:       Rate and Rhythm: Normal rate and regular rhythm.     Heart sounds: Normal heart sounds. No murmur. No friction rub. No gallop.   Pulmonary:     Effort: Pulmonary effort is normal. No tachypnea or respiratory distress.     Breath sounds: Normal breath sounds. No decreased breath sounds, wheezing, rhonchi or rales.  Chest:     Chest wall: No tenderness.  Abdominal:     General: Bowel sounds are normal.     Palpations: Abdomen is soft.  Musculoskeletal:        General: Normal range of motion.     Cervical back: Normal range of motion.  Skin:    General: Skin is warm and  dry.  Neurological:     Mental Status: She is alert and oriented to person, place, and time.     Coordination: Coordination normal.  Psychiatric:        Behavior: Behavior normal. Behavior is cooperative.        Thought Content: Thought content normal.        Judgment: Judgment normal.          Patient has been counseled extensively about nutrition and exercise as well as the importance of adherence with medications and regular follow-up. The patient was given clear instructions to go to ER or return to medical center if symptoms don't improve, worsen or new problems develop. The patient verbalized understanding.   Follow-up: Return in about 3 months (around 12/21/2019).   Gildardo Pounds, FNP-BC [facility] [place] [place]   10/05/2019, 5:49 PM

## 2019-09-24 ENCOUNTER — Other Ambulatory Visit: Payer: Self-pay | Admitting: Nurse Practitioner

## 2019-09-24 DIAGNOSIS — R7989 Other specified abnormal findings of blood chemistry: Secondary | ICD-10-CM

## 2019-09-24 DIAGNOSIS — R3129 Other microscopic hematuria: Secondary | ICD-10-CM

## 2019-09-24 LAB — CMP14+EGFR
ALT: 26 IU/L (ref 0–32)
AST: 28 IU/L (ref 0–40)
Albumin/Globulin Ratio: 1.3 (ref 1.2–2.2)
Albumin: 4.6 g/dL (ref 3.7–4.7)
Alkaline Phosphatase: 109 IU/L (ref 39–117)
BUN/Creatinine Ratio: 25 (ref 12–28)
BUN: 16 mg/dL (ref 8–27)
Bilirubin Total: 0.3 mg/dL (ref 0.0–1.2)
CO2: 22 mmol/L (ref 20–29)
Calcium: 10.4 mg/dL — ABNORMAL HIGH (ref 8.7–10.3)
Chloride: 99 mmol/L (ref 96–106)
Creatinine, Ser: 0.63 mg/dL (ref 0.57–1.00)
GFR calc Af Amer: 104 mL/min/{1.73_m2} (ref >59)
GFR calc non Af Amer: 91 mL/min/{1.73_m2} (ref >59)
Globulin, Total: 3.5 g/dL (ref 1.5–4.5)
Glucose: 91 mg/dL (ref 65–99)
Potassium: 3.7 mmol/L (ref 3.5–5.2)
Sodium: 139 mmol/L (ref 134–144)
Total Protein: 8.1 g/dL (ref 6.0–8.5)

## 2019-09-24 LAB — THYROID PANEL WITH TSH
Free Thyroxine Index: 2.1 (ref 1.2–4.9)
T3 Uptake Ratio: 19 % — ABNORMAL LOW (ref 24–39)
T4, Total: 11.3 ug/dL (ref 4.5–12.0)
TSH: 5.19 u[IU]/mL — ABNORMAL HIGH (ref 0.450–4.500)

## 2019-09-24 LAB — LIPID PANEL
Chol/HDL Ratio: 3.7 ratio (ref 0.0–4.4)
Cholesterol, Total: 209 mg/dL — ABNORMAL HIGH (ref 100–199)
HDL: 57 mg/dL (ref >39)
LDL Chol Calc (NIH): 132 mg/dL — ABNORMAL HIGH (ref 0–99)
Triglycerides: 115 mg/dL (ref 0–149)
VLDL Cholesterol Cal: 20 mg/dL (ref 5–40)

## 2019-09-24 LAB — VITAMIN D 25 HYDROXY (VIT D DEFICIENCY, FRACTURES): Vit D, 25-Hydroxy: 40.6 ng/mL (ref 30.0–100.0)

## 2019-10-05 ENCOUNTER — Encounter: Payer: Self-pay | Admitting: Nurse Practitioner

## 2019-10-18 ENCOUNTER — Ambulatory Visit: Payer: PPO | Attending: Internal Medicine

## 2019-10-18 DIAGNOSIS — Z23 Encounter for immunization: Secondary | ICD-10-CM | POA: Insufficient documentation

## 2019-10-18 NOTE — Progress Notes (Signed)
   Covid-19 Vaccination Clinic  Name:  Michelle Sosa    MRN: [id number] DOB: Mar 16, 1946  10/18/2019  [name] was observed post Covid-19 immunization for 15 minutes without incident. She was provided with Vaccine Information Sheet and instruction to access the V-Safe system.   [name] was instructed to call 911 with any severe reactions post vaccine: Marland Kitchen Difficulty breathing  . Swelling of face and throat  . A fast heartbeat  . A bad rash all over body  . Dizziness and weakness   Immunizations Administered    Name Date Dose VIS Date Route   Pfizer COVID-19 Vaccine 10/18/2019  2:24 PM 0.3 mL 07/26/2019 Intramuscular   Manufacturer: [place]   Lot: [id number]   [place]: [id number]

## 2019-10-31 ENCOUNTER — Ambulatory Visit: Payer: PPO

## 2019-12-06 ENCOUNTER — Other Ambulatory Visit: Payer: Self-pay

## 2019-12-06 ENCOUNTER — Ambulatory Visit
Admission: RE | Admit: 2019-12-06 | Discharge: 2019-12-06 | Disposition: A | Discharge status: Home / Self Care | Payer: PPO | Source: Ambulatory Visit | Attending: Nurse Practitioner | Admitting: Nurse Practitioner

## 2019-12-06 DIAGNOSIS — Z1231 Encounter for screening mammogram for malignant neoplasm of breast: Secondary | ICD-10-CM

## 2019-12-20 ENCOUNTER — Ambulatory Visit: Payer: PPO | Admitting: Nurse Practitioner

## 2019-12-24 ENCOUNTER — Ambulatory Visit: Payer: PPO | Attending: Nurse Practitioner

## 2019-12-24 ENCOUNTER — Other Ambulatory Visit: Payer: Self-pay

## 2019-12-24 DIAGNOSIS — R3129 Other microscopic hematuria: Secondary | ICD-10-CM

## 2019-12-24 DIAGNOSIS — R7989 Other specified abnormal findings of blood chemistry: Secondary | ICD-10-CM | POA: Diagnosis not present

## 2019-12-25 LAB — URINALYSIS, COMPLETE
Bilirubin, UA: NEGATIVE
Glucose, UA: NEGATIVE
Ketones, UA: NEGATIVE
Leukocytes,UA: NEGATIVE
Nitrite, UA: NEGATIVE
Protein,UA: NEGATIVE
RBC, UA: NEGATIVE
Specific Gravity, UA: 1.009 (ref 1.005–1.030)
Urobilinogen, Ur: 0.2 mg/dL (ref 0.2–1.0)
pH, UA: 6.5 (ref 5.0–7.5)

## 2019-12-25 LAB — MICROSCOPIC EXAMINATION
Bacteria, UA: NONE SEEN
Casts: NONE SEEN /lpf
RBC, Urine: NONE SEEN /hpf (ref 0–2)
WBC, UA: NONE SEEN /hpf (ref 0–5)

## 2019-12-25 LAB — THYROID PANEL WITH TSH
Free Thyroxine Index: 2.9 (ref 1.2–4.9)
T3 Uptake Ratio: 23 % — ABNORMAL LOW (ref 24–39)
T4, Total: 12.7 ug/dL — ABNORMAL HIGH (ref 4.5–12.0)
TSH: 5.75 u[IU]/mL — ABNORMAL HIGH (ref 0.450–4.500)

## 2020-01-08 ENCOUNTER — Ambulatory Visit: Payer: PPO | Attending: Nurse Practitioner | Admitting: Nurse Practitioner

## 2020-01-08 ENCOUNTER — Other Ambulatory Visit: Payer: Self-pay

## 2020-01-08 ENCOUNTER — Encounter: Payer: Self-pay | Admitting: Nurse Practitioner

## 2020-01-08 VITALS — BP 124/74

## 2020-01-08 DIAGNOSIS — I10 Essential (primary) hypertension: Secondary | ICD-10-CM | POA: Diagnosis not present

## 2020-01-08 DIAGNOSIS — R7989 Other specified abnormal findings of blood chemistry: Secondary | ICD-10-CM

## 2020-01-08 MED ORDER — AMLODIPINE BESYLATE 10 MG PO TABS: 10.0000 mg | ORAL_TABLET | Freq: Every day | ORAL | 1 refills | Status: DC

## 2020-01-08 MED ORDER — HYDROCHLOROTHIAZIDE 12.5 MG PO TABS: 12.5000 mg | ORAL_TABLET | Freq: Every day | ORAL | 1 refills | Status: DC

## 2020-01-08 MED ORDER — IRBESARTAN 300 MG PO TABS: 300.0000 mg | ORAL_TABLET | Freq: Every day | ORAL | 1 refills | Status: DC

## 2020-01-08 NOTE — Progress Notes (Signed)
Virtual Visit via Telephone Note Due to national recommendations of social distancing due to [place] 19, telehealth visit is felt to be most appropriate for this patient at this time.  I discussed the limitations, risks, security and privacy concerns of performing an evaluation and management service by telephone and the availability of in person appointments. I also discussed with the patient that there may be a patient responsible charge related to this service. The patient expressed understanding and agreed to proceed.    I connected with Nathanial Rancher on 01/08/20  at   2:10 PM EDT  EDT by telephone and verified that I am speaking with the correct person using two identifiers.   Consent I discussed the limitations, risks, security and privacy concerns of performing an evaluation and management service by telephone and the availability of in person appointments. I also discussed with the patient that there may be a patient responsible charge related to this service. The patient expressed understanding and agreed to proceed.   Location of Patient: Private Residence    Location of Provider: [place] and [employer] Office    Persons participating in Telemedicine visit: [name] FNP-BC [place]    History of Present Illness: Telemedicine visit for: Follow up  Essential Hypertension Monitoring her blood pressure at home. Most recent reading 124/67.  Taking HCTZ 12.5 mg, amlodipine 10 mg daily and iresartan 300 mg daily. Denies chest pain, shortness of breath, palpitations, lightheadedness, dizziness, headaches or BLE edema.  BP Readings from Last 3 Encounters:  01/08/20 124/74  09/23/19 (!) 166/75  05/21/19 (!) 166/79    Elevated TSH She is reluctant to start any thyroid medication. States her TSH has been as high as 6 in the past and she was not prescribed synthroid at that time by her PCP. Will recheck in a few weeks.     Past Medical  History:  Diagnosis Date  . Hepatitis C   . Hypertension   . Stroke [facility])     Past Surgical History:  Procedure Laterality Date  . KNEE SURGERY  2005  . LEG SURGERY     right leg, a rod.  Marland Kitchen MASTECTOMY Right   . WRIST SURGERY  2008    Family History  Problem Relation Age of Onset  . Heart disease Father   . Breast cancer Neg Hx     Social History   Socioeconomic History  . Marital status: Single    Spouse name: Not on file  . Number of children: Not on file  . Years of education: Not on file  . Highest education level: Not on file  Occupational History  . Not on file  Tobacco Use  . Smoking status: Former Smoker    Types: Cigarettes    Quit date: 02/14/2011    Years since quitting: 8.9  . Smokeless tobacco: Never Used  Substance and Sexual Activity  . Alcohol use: No  . Drug use: No  . Sexual activity: Not Currently  Other Topics Concern  . Not on file  Social History Narrative  . Not on file   Social Determinants of Health   Financial Resource Strain:   . Difficulty of Paying Living Expenses:   Food Insecurity:   . Worried About Charity fundraiser in the Last Year:   . Arboriculturist in the Last Year:   Transportation Needs:   . Film/video editor (Medical):   Marland Kitchen Lack of Transportation (Non-Medical):  Physical Activity:   . Days of Exercise per Week:   . Minutes of Exercise per Session:   Stress:   . Feeling of Stress :   Social Connections:   . Frequency of Communication with Friends and Family:   . Frequency of Social Gatherings with Friends and Family:   . Attends Religious Services:   . Active Member of Clubs or Organizations:   . Attends Archivist Meetings:   Marland Kitchen Marital Status:      Observations/Objective: Awake, alert and oriented x 3   ROS  Assessment and Plan: [place] was seen today for follow-up.  Diagnoses and all orders for this visit:  Essential hypertension -     amLODipine (NORVASC) 10 MG tablet; Take 1 tablet  (10 mg total) by mouth daily. -     hydrochlorothiazide (HYDRODIURIL) 12.5 MG tablet; Take 1 tablet (12.5 mg total) by mouth daily. -     irbesartan (AVAPRO) 300 MG tablet; Take 1 tablet (300 mg total) by mouth daily. Continue all antihypertensives as prescribed.  Remember to bring in your blood pressure log with you for your follow up appointment.  DASH/Mediterranean Diets are healthier choices for HTN.   Elevated TSH -     Thyroid Panel With TSH; Future     Follow Up Instructions Return in about 4 weeks (around 02/05/2020) for TSH.     I discussed the assessment and treatment plan with the patient. The patient was provided an opportunity to ask questions and all were answered. The patient agreed with the plan and demonstrated an understanding of the instructions.   The patient was advised to call back or seek an in-person evaluation if the symptoms worsen or if the condition fails to improve as anticipated.  I provided 18 minutes of non-face-to-face time during this encounter including median intraservice time, reviewing previous notes, labs, imaging, medications and explaining diagnosis and management.  Gildardo Pounds, FNP-BC

## 2020-02-03 ENCOUNTER — Other Ambulatory Visit: Payer: Self-pay | Admitting: Nurse Practitioner

## 2020-02-03 ENCOUNTER — Other Ambulatory Visit: Payer: Self-pay

## 2020-02-03 ENCOUNTER — Ambulatory Visit: Payer: PPO | Attending: Family Medicine

## 2020-02-03 DIAGNOSIS — R7989 Other specified abnormal findings of blood chemistry: Secondary | ICD-10-CM

## 2020-02-03 DIAGNOSIS — I739 Peripheral vascular disease, unspecified: Secondary | ICD-10-CM

## 2020-02-04 ENCOUNTER — Other Ambulatory Visit: Payer: Self-pay | Admitting: Nurse Practitioner

## 2020-02-04 DIAGNOSIS — R7989 Other specified abnormal findings of blood chemistry: Secondary | ICD-10-CM

## 2020-02-04 LAB — CBC
Hematocrit: 40.7 % (ref 34.0–46.6)
Hemoglobin: 13.6 g/dL (ref 11.1–15.9)
MCH: 31.7 pg (ref 26.6–33.0)
MCHC: 33.4 g/dL (ref 31.5–35.7)
MCV: 95 fL (ref 79–97)
Platelets: 289 10*3/uL (ref 150–450)
RBC: 4.29 x10E6/uL (ref 3.77–5.28)
RDW: 11.8 % (ref 11.7–15.4)
WBC: 7.1 10*3/uL (ref 3.4–10.8)

## 2020-02-04 LAB — THYROID PANEL WITH TSH
Free Thyroxine Index: 2.6 (ref 1.2–4.9)
T3 Uptake Ratio: 23 % — ABNORMAL LOW (ref 24–39)
T4, Total: 11.3 ug/dL (ref 4.5–12.0)
TSH: 7.87 u[IU]/mL — ABNORMAL HIGH (ref 0.450–4.500)

## 2020-03-16 ENCOUNTER — Ambulatory Visit
Admission: RE | Admit: 2020-03-16 | Discharge: 2020-03-16 | Disposition: A | Discharge status: Home / Self Care | Payer: PPO | Source: Ambulatory Visit | Attending: Nurse Practitioner | Admitting: Nurse Practitioner

## 2020-03-16 ENCOUNTER — Other Ambulatory Visit: Payer: Self-pay

## 2020-03-16 DIAGNOSIS — Z87891 Personal history of nicotine dependence: Secondary | ICD-10-CM | POA: Diagnosis not present

## 2020-03-16 DIAGNOSIS — Z780 Asymptomatic menopausal state: Secondary | ICD-10-CM | POA: Diagnosis not present

## 2020-03-16 DIAGNOSIS — M85852 Other specified disorders of bone density and structure, left thigh: Secondary | ICD-10-CM | POA: Diagnosis not present

## 2020-03-16 DIAGNOSIS — M810 Age-related osteoporosis without current pathological fracture: Secondary | ICD-10-CM

## 2020-03-17 ENCOUNTER — Ambulatory Visit: Payer: PPO | Admitting: Endocrinology

## 2020-03-25 ENCOUNTER — Other Ambulatory Visit: Payer: Self-pay | Admitting: Nurse Practitioner

## 2020-03-25 ENCOUNTER — Ambulatory Visit: Payer: PPO | Attending: Nurse Practitioner | Admitting: Nurse Practitioner

## 2020-03-25 ENCOUNTER — Other Ambulatory Visit: Payer: Self-pay

## 2020-03-25 ENCOUNTER — Encounter: Payer: Self-pay | Admitting: Nurse Practitioner

## 2020-03-25 VITALS — BP 147/76 | HR 65 | Temp 97.7°F | Ht 63.0 in | Wt 165.0 lb

## 2020-03-25 DIAGNOSIS — Z1211 Encounter for screening for malignant neoplasm of colon: Secondary | ICD-10-CM

## 2020-03-25 DIAGNOSIS — Z1159 Encounter for screening for other viral diseases: Secondary | ICD-10-CM

## 2020-03-25 DIAGNOSIS — M810 Age-related osteoporosis without current pathological fracture: Secondary | ICD-10-CM

## 2020-03-25 DIAGNOSIS — I10 Essential (primary) hypertension: Secondary | ICD-10-CM

## 2020-03-25 NOTE — Progress Notes (Signed)
Assessment & Plan:  Michelle Sosa was seen today for follow-up.  Diagnoses and all orders for this visit:  Essential hypertension -     CMP14+EGFR Continue all antihypertensives as prescribed.  Remember to bring in your blood pressure log with you for your follow up appointment.  DASH/Mediterranean Diets are healthier choices for HTN.   Colon cancer screening -     Fecal occult blood, imunochemical([facility])  Need for hepatitis C screening test -     Hepatitis C Antibody  Age-related osteoporosis without current pathological fracture She would like to continue to research Fosamax and Prolia. I have also given her resources for reclast in addition to Prolia today. She is hesitant due to potential side effects.   Patient has been counseled on age-appropriate routine health concerns for screening and prevention. These are reviewed and up-to-date. Referrals have been placed accordingly. Immunizations are up-to-date or declined.    Subjective:   Chief Complaint  Patient presents with  . Follow-up    Pt. is here for hypertension follow up.    HPI Michelle Sosa 74 y.o. female presents to office today for follow up.  Osteoporosis Hesitant to take fosamax after researching potential side effects. Currently taking a Calcium-Vit D supplement but can not recall the exact dose. Takes 1 tablet BID. Wants to research her options more before making a choice regarding treatment. I also discussed prolia with her today.    Essential Hypertension Home readings: 120-130/70s. Taking amlodipine 10 mg daily, HCTZ 12.5 mg daily and irbesartan 300  Mg daily as prescribed. Denies chest pain, shortness of breath, palpitations, lightheadedness, dizziness, headaches or BLE edema.  BP Readings from Last 3 Encounters:  03/25/20 (!) 147/76  01/08/20 124/74  09/23/19 (!) 166/75     Review of Systems  Constitutional: Negative for fever, malaise/fatigue and weight loss.  HENT: Negative.  Negative  for nosebleeds.   Eyes: Negative.  Negative for blurred vision, double vision and photophobia.  Respiratory: Negative.  Negative for cough and shortness of breath.   Cardiovascular: Negative.  Negative for chest pain, palpitations and leg swelling.  Gastrointestinal: Negative.  Negative for heartburn, nausea and vomiting.  Musculoskeletal: Negative.  Negative for myalgias.  Neurological: Negative.  Negative for dizziness, focal weakness, seizures and headaches.  Psychiatric/Behavioral: Negative.  Negative for suicidal ideas.    Past Medical History:  Diagnosis Date  . Hepatitis C   . Hypertension   . Stroke [facility])     Past Surgical History:  Procedure Laterality Date  . KNEE SURGERY  2005  . LEG SURGERY     right leg, a rod.  Marland Kitchen MASTECTOMY Right   . WRIST SURGERY  2008    Family History  Problem Relation Age of Onset  . Heart disease Father   . Breast cancer Neg Hx     Social History Reviewed with no changes to be made today.   Outpatient Medications Prior to Visit  Medication Sig Dispense Refill  . amLODipine (NORVASC) 10 MG tablet Take 1 tablet (10 mg total) by mouth daily. 90 tablet 1  . ASPIRIN 81 PO Take by mouth.    . Calcium Carbonate-Vitamin D3 (CALCIUM 600-D) 600-400 MG-UNIT TABS Take by mouth.    . Fish Oil-Cholecalciferol (OMEGA-3 FISH OIL-VITAMIN D3) 1200-1000 MG-UNIT CAPS Take by mouth.    . hydrochlorothiazide (HYDRODIURIL) 12.5 MG tablet Take 1 tablet (12.5 mg total) by mouth daily. 90 tablet 1  . irbesartan (AVAPRO) 300 MG tablet Take 1 tablet (300 mg total)  by mouth daily. 90 tablet 1  . Multiple Vitamins-Minerals (CENTRUM SILVER 50+WOMEN PO) Take by mouth.    . multivitamin-iron-minerals-folic acid (CENTRUM) chewable tablet Chew 1 tablet by mouth daily.    Marland Kitchen thiamine (VITAMIN B-1) 100 MG tablet Take 100 mg by mouth daily.    . vitamin B-12 (CYANOCOBALAMIN) 100 MCG tablet Take 100 mcg by mouth daily.     No facility-administered medications prior to  visit.    Allergies  Allergen Reactions  . Metoprolol Tartrate Other (See Comments)    Affects her mood/ lability  . Cipro [Ciprofloxacin Hcl]     Joint and muscle pain  . Codeine Other (See Comments)  . Penicillins Other (See Comments)  . Sulfa Antibiotics     States she can take Bactrim       Objective:    BP (!) 147/76 (BP Location: Left Arm, Patient Position: Sitting, Cuff Size: Normal)   Pulse 65   Temp 97.7 F (36.5 C) (Temporal)   Ht '5\' 3"'  (1.6 m)   Wt 165 lb (74.8 kg)   SpO2 94%   BMI 29.23 kg/m  Wt Readings from Last 3 Encounters:  03/25/20 165 lb (74.8 kg)  09/23/19 168 lb 6.4 oz (76.4 kg)  05/21/19 164 lb (74.4 kg)    Physical Exam Vitals and nursing note reviewed.  Constitutional:      Appearance: She is well-developed.  HENT:     Head: Normocephalic and atraumatic.  Cardiovascular:     Rate and Rhythm: Normal rate and regular rhythm.     Heart sounds: Normal heart sounds. No murmur heard.  No friction rub. No gallop.   Pulmonary:     Effort: Pulmonary effort is normal. No tachypnea or respiratory distress.     Breath sounds: Normal breath sounds. No decreased breath sounds, wheezing, rhonchi or rales.  Chest:     Chest wall: No tenderness.  Abdominal:     General: Bowel sounds are normal.     Palpations: Abdomen is soft.  Musculoskeletal:        General: Normal range of motion.     Cervical back: Normal range of motion.  Skin:    General: Skin is warm and dry.  Neurological:     Mental Status: She is alert and oriented to person, place, and time.     Coordination: Coordination normal.  Psychiatric:        Behavior: Behavior normal. Behavior is cooperative.        Thought Content: Thought content normal.        Judgment: Judgment normal.          Patient has been counseled extensively about nutrition and exercise as well as the importance of adherence with medications and regular follow-up. The patient was given clear instructions to go  to ER or return to medical center if symptoms don't improve, worsen or new problems develop. The patient verbalized understanding.   Follow-up: Return in about 3 months (around 06/25/2020).   Gildardo Pounds, FNP-BC [facility], [place]   03/25/2020, 10:49 AM

## 2020-03-25 NOTE — Patient Instructions (Signed)
Denosumab injection °What is this medicine? °DENOSUMAB (den oh sue mab) slows bone breakdown. Prolia is used to treat osteoporosis in women after menopause and in men, and in people who are taking corticosteroids for 6 months or more. Xgeva is used to treat a high calcium level due to cancer and to prevent bone fractures and other bone problems caused by multiple myeloma or cancer bone metastases. Xgeva is also used to treat giant cell tumor of the bone. °This medicine may be used for other purposes; ask your health care provider or pharmacist if you have questions. °COMMON BRAND NAME(S): Prolia, XGEVA °What should I tell my health care provider before I take this medicine? °They need to know if you have any of these conditions: °· dental disease °· having surgery or tooth extraction °· infection °· kidney disease °· low levels of calcium or Vitamin D in the blood °· malnutrition °· on hemodialysis °· skin conditions or sensitivity °· thyroid or parathyroid disease °· an unusual reaction to denosumab, other medicines, foods, dyes, or preservatives °· pregnant or trying to get pregnant °· breast-feeding °How should I use this medicine? °This medicine is for injection under the skin. It is given by a health care professional in a hospital or clinic setting. °A special MedGuide will be given to you before each treatment. Be sure to read this information carefully each time. °For Prolia, talk to your pediatrician regarding the use of this medicine in children. Special care may be needed. For Xgeva, talk to your pediatrician regarding the use of this medicine in children. While this drug may be prescribed for children as young as 13 years for selected conditions, precautions do apply. °Overdosage: If you think you have taken too much of this medicine contact a poison control center or emergency room at once. °NOTE: This medicine is only for you. Do not share this medicine with others. °What if I miss a dose? °It is  important not to miss your dose. Call your doctor or health care professional if you are unable to keep an appointment. °What may interact with this medicine? °Do not take this medicine with any of the following medications: °· other medicines containing denosumab °This medicine may also interact with the following medications: °· medicines that lower your chance of fighting infection °· steroid medicines like prednisone or cortisone °This list may not describe all possible interactions. Give your health care provider a list of all the medicines, herbs, non-prescription drugs, or dietary supplements you use. Also tell them if you smoke, drink alcohol, or use illegal drugs. Some items may interact with your medicine. °What should I watch for while using this medicine? °Visit your doctor or health care professional for regular checks on your progress. Your doctor or health care professional may order blood tests and other tests to see how you are doing. °Call your doctor or health care professional for advice if you get a fever, chills or sore throat, or other symptoms of a cold or flu. Do not treat yourself. This drug may decrease your body's ability to fight infection. Try to avoid being around people who are sick. °You should make sure you get enough calcium and vitamin D while you are taking this medicine, unless your doctor tells you not to. Discuss the foods you eat and the vitamins you take with your health care professional. °See your dentist regularly. Brush and floss your teeth as directed. Before you have any dental work done, tell your dentist you are   receiving this medicine. Do not become pregnant while taking this medicine or for 5 months after stopping it. Talk with your doctor or health care professional about your birth control options while taking this medicine. Women should inform their doctor if they wish to become pregnant or think they might be pregnant. There is a potential for serious side  effects to an unborn child. Talk to your health care professional or pharmacist for more information. What side effects may I notice from receiving this medicine? Side effects that you should report to your doctor or health care professional as soon as possible:  allergic reactions like skin rash, itching or hives, swelling of the face, lips, or tongue  bone pain  breathing problems  dizziness  jaw pain, especially after dental work  redness, blistering, peeling of the skin  signs and symptoms of infection like fever or chills; cough; sore throat; pain or trouble passing urine  signs of low calcium like fast heartbeat, muscle cramps or muscle pain; pain, tingling, numbness in the hands or feet; seizures  unusual bleeding or bruising  unusually weak or tired Side effects that usually do not require medical attention (report to your doctor or health care professional if they continue or are bothersome):  constipation  diarrhea  headache  joint pain  loss of appetite  muscle pain  runny nose  tiredness  upset stomach This list may not describe all possible side effects. Call your doctor for medical advice about side effects. You may report side effects to FDA at 1-800-FDA-1088. Where should I keep my medicine? This medicine is only given in a clinic, doctor's office, or other health care setting and will not be stored at home. NOTE: This sheet is a summary. It may not cover all possible information. If you have questions about this medicine, talk to your doctor, pharmacist, or health care provider.  2020 Elsevier/Gold Standard (2017-12-08 16:10:44) Zoledronic Acid injection (Paget's Disease, Osteoporosis) What is this medicine? ZOLEDRONIC ACID (ZOE le dron ik AS id) lowers the amount of calcium loss from bone. It is used to treat Paget's disease and osteoporosis in women. This medicine may be used for other purposes; ask your health care provider or pharmacist if you  have questions. COMMON BRAND NAME(S): Reclast, Zometa What should I tell my health care provider before I take this medicine? They need to know if you have any of these conditions:  aspirin-sensitive asthma  cancer, especially if you are receiving medicines used to treat cancer  dental disease or wear dentures  infection  kidney disease  low levels of calcium in the blood  past surgery on the parathyroid gland or intestines  receiving corticosteroids like dexamethasone or prednisone  an unusual or allergic reaction to zoledronic acid, other medicines, foods, dyes, or preservatives  pregnant or trying to get pregnant  breast-feeding How should I use this medicine? This medicine is for infusion into a vein. It is given by a health care professional in a hospital or clinic setting. Talk to your pediatrician regarding the use of this medicine in children. This medicine is not approved for use in children. Overdosage: If you think you have taken too much of this medicine contact a poison control center or emergency room at once. NOTE: This medicine is only for you. Do not share this medicine with others. What if I miss a dose? It is important not to miss your dose. Call your doctor or health care professional if you are unable to keep  an appointment. What may interact with this medicine?  certain antibiotics given by injection  NSAIDs, medicines for pain and inflammation, like ibuprofen or naproxen  some diuretics like bumetanide, furosemide  teriparatide This list may not describe all possible interactions. Give your health care provider a list of all the medicines, herbs, non-prescription drugs, or dietary supplements you use. Also tell them if you smoke, drink alcohol, or use illegal drugs. Some items may interact with your medicine. What should I watch for while using this medicine? Visit your doctor or health care professional for regular checkups. It may be some time  before you see the benefit from this medicine. Do not stop taking your medicine unless your doctor tells you to. Your doctor may order blood tests or other tests to see how you are doing. Women should inform their doctor if they wish to become pregnant or think they might be pregnant. There is a potential for serious side effects to an unborn child. Talk to your health care professional or pharmacist for more information. You should make sure that you get enough calcium and vitamin D while you are taking this medicine. Discuss the foods you eat and the vitamins you take with your health care professional. Some people who take this medicine have severe bone, joint, and/or muscle pain. This medicine may also increase your risk for jaw problems or a broken thigh bone. Tell your doctor right away if you have severe pain in your jaw, bones, joints, or muscles. Tell your doctor if you have any pain that does not go away or that gets worse. Tell your dentist and dental surgeon that you are taking this medicine. You should not have major dental surgery while on this medicine. See your dentist to have a dental exam and fix any dental problems before starting this medicine. Take good care of your teeth while on this medicine. Make sure you see your dentist for regular follow-up appointments. What side effects may I notice from receiving this medicine? Side effects that you should report to your doctor or health care professional as soon as possible:  allergic reactions like skin rash, itching or hives, swelling of the face, lips, or tongue  anxiety, confusion, or depression  breathing problems  changes in vision  eye pain  feeling faint or lightheaded, falls  jaw pain, especially after dental work  mouth sores  muscle cramps, stiffness, or weakness  redness, blistering, peeling or loosening of the skin, including inside the mouth  trouble passing urine or change in the amount of urine Side effects  that usually do not require medical attention (report to your doctor or health care professional if they continue or are bothersome):  bone, joint, or muscle pain  constipation  diarrhea  fever  hair loss  irritation at site where injected  loss of appetite  nausea, vomiting  stomach upset  trouble sleeping  trouble swallowing  weak or tired This list may not describe all possible side effects. Call your doctor for medical advice about side effects. You may report side effects to FDA at 1-800-FDA-1088. Where should I keep my medicine? This drug is given in a hospital or clinic and will not be stored at home. NOTE: This sheet is a summary. It may not cover all possible information. If you have questions about this medicine, talk to your doctor, pharmacist, or health care provider.  2020 Elsevier/Gold Standard (2013-12-28 14:19:57)  Osteoporosis  Osteoporosis is thinning and loss of density in your bones.  Osteoporosis makes bones more brittle and fragile and more likely to break (fracture). Over time, osteoporosis can cause your bones to become so weak that they fracture after a minor fall. Bones in the hip, wrist, and spine are most likely to fracture due to osteoporosis. What are the causes? The exact cause of this condition is not known. What increases the risk? You may be at greater risk for osteoporosis if you:  Have a family history of the condition.  Have poor nutrition.  Use steroid medicines, such as prednisone.  Are female.  Are age 45 or older.  Smoke or have a history of smoking.  Are not physically active (are sedentary).  Are white (Caucasian) or of Asian descent.  Have a small body frame.  Take certain medicines, such as antiseizure medicines. What are the signs or symptoms? A fracture might be the first sign of osteoporosis, especially if the fracture results from a fall or injury that usually would not cause a bone to break. Other signs and  symptoms include:  Pain in the neck or low back.  Stooped posture.  Loss of height. How is this diagnosed? This condition may be diagnosed based on:  Your medical history.  A physical exam.  A bone mineral density test, also called a DXA or DEXA test (dual-energy X-ray absorptiometry test). This test uses X-rays to measure the amount of minerals in your bones. How is this treated? The goal of treatment is to strengthen your bones and lower your risk for a fracture. Treatment may involve:  Making lifestyle changes, such as: ? Including foods with more calcium and vitamin D in your diet. ? Doing weight-bearing and muscle-strengthening exercises. ? Stopping tobacco use. ? Limiting alcohol intake.  Taking medicine to slow the process of bone loss or to increase bone density.  Taking daily supplements of calcium and vitamin D.  Taking hormone replacement medicines, such as estrogen for women and testosterone for men.  Monitoring your levels of calcium and vitamin D. Follow these instructions at home:  Activity  Exercise as told by your health care provider. Ask your health care provider what exercises and activities are safe for you. You should do: ? Exercises that make you work against gravity (weight-bearing exercises), such as tai chi, yoga, or walking. ? Exercises to strengthen muscles, such as lifting weights. Lifestyle  Limit alcohol intake to no more than 1 drink a day for nonpregnant women and 2 drinks a day for men. One drink equals 12 oz of beer, 5 oz of wine, or 1 oz of hard liquor.  Do not use any products that contain nicotine or tobacco, such as cigarettes and e-cigarettes. If you need help quitting, ask your health care provider. Preventing falls  Use devices to help you move around (mobility aids) as needed, such as canes, walkers, scooters, or crutches.  Keep rooms well-lit and clutter-free.  Remove tripping hazards from walkways, including cords and  throw rugs.  Install grab bars in bathrooms and safety rails on stairs.  Use rubber mats in the bathroom and other areas that are often wet or slippery.  Wear closed-toe shoes that fit well and support your feet. Wear shoes that have rubber soles or low heels.  Review your medicines with your health care provider. Some medicines can cause dizziness or changes in blood pressure, which can increase your risk of falling. General instructions  Include calcium and vitamin D in your diet. Calcium is important for bone health, and vitamin D  helps your body to absorb calcium. Good sources of calcium and vitamin D include: ? Certain fatty fish, such as salmon and tuna. ? Products that have calcium and vitamin D added to them (fortified products), such as fortified cereals. ? Egg yolks. ? Cheese. ? Liver.  Take over-the-counter and prescription medicines only as told by your health care provider.  Keep all follow-up visits as told by your health care provider. This is important. Contact a health care provider if:  You have never been screened for osteoporosis and you are: ? A woman who is age 69 or older. ? A man who is age 78 or older. Get help right away if:  You fall or injure yourself. Summary  Osteoporosis is thinning and loss of density in your bones. This makes bones more brittle and fragile and more likely to break (fracture),even with minor falls.  The goal of treatment is to strengthen your bones and reduce your risk for a fracture.  Include calcium and vitamin D in your diet. Calcium is important for bone health, and vitamin D helps your body to absorb calcium.  Talk with your health care provider about screening for osteoporosis if you are a woman who is age 63 or older, or a man who is age 50 or older. This information is not intended to replace advice given to you by your health care provider. Make sure you discuss any questions you have with your health care  provider. Document Revised: 07/14/2017 Document Reviewed: 05/26/2017 Elsevier Patient Education  2020 [employer].

## 2020-03-26 ENCOUNTER — Encounter: Payer: Self-pay | Admitting: Nurse Practitioner

## 2020-03-26 ENCOUNTER — Other Ambulatory Visit: Payer: Self-pay | Admitting: Nurse Practitioner

## 2020-03-26 DIAGNOSIS — R768 Other specified abnormal immunological findings in serum: Secondary | ICD-10-CM

## 2020-03-26 LAB — CMP14+EGFR
ALT: 22 IU/L (ref 0–32)
AST: 23 IU/L (ref 0–40)
Albumin/Globulin Ratio: 1.2 (ref 1.2–2.2)
Albumin: 4.1 g/dL (ref 3.7–4.7)
Alkaline Phosphatase: 90 IU/L (ref 48–121)
BUN/Creatinine Ratio: 29 — ABNORMAL HIGH (ref 12–28)
BUN: 19 mg/dL (ref 8–27)
Bilirubin Total: 0.5 mg/dL (ref 0.0–1.2)
CO2: 22 mmol/L (ref 20–29)
Calcium: 10.1 mg/dL (ref 8.7–10.3)
Chloride: 100 mmol/L (ref 96–106)
Creatinine, Ser: 0.66 mg/dL (ref 0.57–1.00)
GFR calc Af Amer: 103 mL/min/{1.73_m2} (ref >59)
GFR calc non Af Amer: 89 mL/min/{1.73_m2} (ref >59)
Globulin, Total: 3.3 g/dL (ref 1.5–4.5)
Glucose: 101 mg/dL — ABNORMAL HIGH (ref 65–99)
Potassium: 3.9 mmol/L (ref 3.5–5.2)
Sodium: 139 mmol/L (ref 134–144)
Total Protein: 7.4 g/dL (ref 6.0–8.5)

## 2020-03-26 LAB — HEPATITIS C ANTIBODY: Hep C Virus Ab: >11 s/co ratio — ABNORMAL HIGH (ref 0.0–0.9)

## 2020-04-01 ENCOUNTER — Encounter: Payer: Self-pay | Admitting: Endocrinology

## 2020-04-01 ENCOUNTER — Other Ambulatory Visit: Payer: Self-pay

## 2020-04-01 ENCOUNTER — Ambulatory Visit: Payer: PPO | Admitting: Endocrinology

## 2020-04-01 DIAGNOSIS — E039 Hypothyroidism, unspecified: Secondary | ICD-10-CM

## 2020-04-01 NOTE — Progress Notes (Signed)
Subjective:    Patient ID: Michelle Sosa, female    DOB: May 06, 1946, 74 y.o.   MRN: [id number]  HPI Pt is referred by [name], NP, for hypothyroidism.  Pt reports hypothyroidism was dx'ed in 2011.  she has never been on prescribed thyroid hormone therapy.  she has never taken kelp or any other type of non-prescribed thyroid product.  she has never had thyroid imaging.  She has never had thyroid surgery, or XRT to the neck.  she has never been on amiodarone or lithium.  She reports weight gain. Past Medical History:  Diagnosis Date  . Hepatitis C   . Hypertension   . Stroke [facility])     Past Surgical History:  Procedure Laterality Date  . KNEE SURGERY  2005  . LEG SURGERY     right leg, a rod.  Marland Kitchen MASTECTOMY Right   . WRIST SURGERY  2008    Social History   Socioeconomic History  . Marital status: Single    Spouse name: Not on file  . Number of children: Not on file  . Years of education: Not on file  . Highest education level: Not on file  Occupational History  . Not on file  Tobacco Use  . Smoking status: Former Smoker    Types: Cigarettes    Quit date: 02/14/2011    Years since quitting: 9.1  . Smokeless tobacco: Never Used  Vaping Use  . Vaping Use: Never used  Substance and Sexual Activity  . Alcohol use: No  . Drug use: No  . Sexual activity: Not Currently  Other Topics Concern  . Not on file  Social History Narrative  . Not on file   Social Determinants of Health   Financial Resource Strain:   . Difficulty of Paying Living Expenses: Not on file  Food Insecurity:   . Worried About Charity fundraiser in the Last Year: Not on file  . Ran Out of Food in the Last Year: Not on file  Transportation Needs:   . Lack of Transportation (Medical): Not on file  . Lack of Transportation (Non-Medical): Not on file  Physical Activity:   . Days of Exercise per Week: Not on file  . Minutes of Exercise per Session: Not on file  Stress:   . Feeling of Stress :  Not on file  Social Connections:   . Frequency of Communication with Friends and Family: Not on file  . Frequency of Social Gatherings with Friends and Family: Not on file  . Attends Religious Services: Not on file  . Active Member of Clubs or Organizations: Not on file  . Attends Archivist Meetings: Not on file  . Marital Status: Not on file  Intimate Partner Violence:   . Fear of Current or Ex-Partner: Not on file  . Emotionally Abused: Not on file  . Physically Abused: Not on file  . Sexually Abused: Not on file    Current Outpatient Medications on File Prior to Visit  Medication Sig Dispense Refill  . amLODipine (NORVASC) 10 MG tablet Take 1 tablet (10 mg total) by mouth daily. 90 tablet 1  . ASPIRIN 81 PO Take 1 tablet by mouth daily.     . Calcium Carbonate-Vitamin D3 (CALCIUM 600-D) 600-400 MG-UNIT TABS Take 1 tablet by mouth 2 (two) times daily.     . hydrochlorothiazide (HYDRODIURIL) 12.5 MG tablet Take 1 tablet (12.5 mg total) by mouth daily. 90 tablet 1  . irbesartan (AVAPRO)  300 MG tablet Take 1 tablet (300 mg total) by mouth daily. (Patient taking differently: Take 300 mg by mouth every evening. ) 90 tablet 1  . Multiple Vitamins-Minerals (CENTRUM SILVER 50+WOMEN PO) Take 1 tablet by mouth daily.     . Omega-3 Fatty Acids (OMEGA-3 FISH OIL PO) Take 2 tablets by mouth daily.    Marland Kitchen thiamine (VITAMIN B-1) 100 MG tablet Take 100 mg by mouth daily.    . vitamin B-12 (CYANOCOBALAMIN) 100 MCG tablet Take 100 mcg by mouth daily.     No current facility-administered medications on file prior to visit.    Allergies  Allergen Reactions  . Metoprolol Tartrate Other (See Comments)    Affects her mood/ lability  . Cipro [Ciprofloxacin Hcl]     Joint and muscle pain  . Codeine Other (See Comments)  . Penicillins Other (See Comments)  . Sulfa Antibiotics     States she can take Bactrim    Family History  Problem Relation Age of Onset  . Heart disease Father   .  Hypothyroidism Sister   . Breast cancer Neg Hx     BP (!) 142/70   Pulse 85   Ht 5\' 3"  (1.6 m)   Wt 165 lb 3.2 oz (74.9 kg)   SpO2 96%   BMI 29.26 kg/m     Review of Systems denies depression, muscle cramps, memory loss, constipation, numbness, cold intolerance, and dry skin.      Objective:   Physical Exam VS: see vs page GEN: no distress HEAD: head: no deformity eyes: no periorbital swelling, no proptosis external nose and ears are normal NECK: supple, thyroid is not enlarged CHEST WALL: no deformity LUNGS: clear to auscultation CV: reg rate and rhythm, no murmur.  MUSCULOSKELETAL: muscle bulk and strength are grossly normal.  no obvious joint swelling.  gait is steady, with a cane.   EXTEMITIES: no deformity.  no edema PULSES: no carotid bruit NEURO:  cn 2-12 grossly intact.   readily moves all 4's.  sensation is intact to touch on all 4's SKIN:  Normal texture and temperature.  No rash or suspicious lesion is visible.   NODES:  None palpable at the neck PSYCH: alert, well-oriented.  Does not appear anxious nor depressed.   Lab Results  Component Value Date   TSH 7.870 (H) 02/03/2020   T3TOTAL 124 04/24/2017   T4TOTAL 11.3 02/03/2020   I have reviewed outside records, and summarized: Pt was noted to have elevated TSH, and referred here.  Osteoporosis was also addressed.  She was hesitant to take rx      Assessment & Plan:  Hypothyroidism, new to me.  We discussed uncertain prognosis, and the pros and cons of levothyroxine in this situation.  She declines medication for now.     Patient Instructions  We'll plan to follow the thyroid blood tests for now.   Please come back for a follow-up appointment in 3-4 months.       Hypothyroidism  Hypothyroidism is when the thyroid gland does not make enough of certain hormones (it is underactive). The thyroid gland is a small gland located in the lower front part of the neck, just in front of the windpipe  (trachea). This gland makes hormones that help control how the body uses food for energy (metabolism) as well as how the heart and brain function. These hormones also play a role in keeping your bones strong. When the thyroid is underactive, it produces too little of the  hormones thyroxine (T4) and triiodothyronine (T3). What are the causes? This condition may be caused by:  Hashimoto's disease. This is a disease in which the body's disease-fighting system (immune system) attacks the thyroid gland. This is the most common cause.  Viral infections.  Pregnancy.  Certain medicines.  Birth defects.  Past radiation treatments to the head or neck for cancer.  Past treatment with radioactive iodine.  Past exposure to radiation in the environment.  Past surgical removal of part or all of the thyroid.  Problems with a gland in the center of the brain (pituitary gland).  Lack of enough iodine in the diet. What increases the risk? You are more likely to develop this condition if:  You are female.  You have a family history of thyroid conditions.  You use a medicine called lithium.  You take medicines that affect the immune system (immunosuppressants). What are the signs or symptoms? Symptoms of this condition include:  Feeling as though you have no energy (lethargy).  Not being able to tolerate cold.  Weight gain that is not explained by a change in diet or exercise habits.  Lack of appetite.  Dry skin.  Coarse hair.  Menstrual irregularity.  Slowing of thought processes.  Constipation.  Sadness or depression. How is this diagnosed? This condition may be diagnosed based on:  Your symptoms, your medical history, and a physical exam.  Blood tests. You may also have imaging tests, such as an ultrasound or MRI. How is this treated? This condition is treated with medicine that replaces the thyroid hormones that your body does not make. After you begin treatment, it  may take several weeks for symptoms to go away. Follow these instructions at home:  Take over-the-counter and prescription medicines only as told by your health care provider.  If you start taking any new medicines, tell your health care provider.  Keep all follow-up visits as told by your health care provider. This is important. ? As your condition improves, your dosage of thyroid hormone medicine may change. ? You will need to have blood tests regularly so that your health care provider can monitor your condition. Contact a health care provider if:  Your symptoms do not get better with treatment.  You are taking thyroid replacement medicine and you: ? Sweat a lot. ? Have tremors. ? Feel anxious. ? Lose weight rapidly. ? Cannot tolerate heat. ? Have emotional swings. ? Have diarrhea. ? Feel weak. Get help right away if you have:  Chest pain.  An irregular heartbeat.  A rapid heartbeat.  Difficulty breathing. Summary  Hypothyroidism is when the thyroid gland does not make enough of certain hormones (it is underactive).  When the thyroid is underactive, it produces too little of the hormones thyroxine (T4) and triiodothyronine (T3).  The most common cause is Hashimoto's disease, a disease in which the body's disease-fighting system (immune system) attacks the thyroid gland. The condition can also be caused by viral infections, medicine, pregnancy, or past radiation treatment to the head or neck.  Symptoms may include weight gain, dry skin, constipation, feeling as though you do not have energy, and not being able to tolerate cold.  This condition is treated with medicine to replace the thyroid hormones that your body does not make. This information is not intended to replace advice given to you by your health care provider. Make sure you discuss any questions you have with your health care provider. Document Revised: 07/14/2017 Document Reviewed: 07/12/2017 Elsevier  Patient  Education  [employer].

## 2020-04-01 NOTE — Patient Instructions (Addendum)
We'll plan to follow the thyroid blood tests for now.   Please come back for a follow-up appointment in 3-4 months.       Hypothyroidism  Hypothyroidism is when the thyroid gland does not make enough of certain hormones (it is underactive). The thyroid gland is a small gland located in the lower front part of the neck, just in front of the windpipe (trachea). This gland makes hormones that help control how the body uses food for energy (metabolism) as well as how the heart and brain function. These hormones also play a role in keeping your bones strong. When the thyroid is underactive, it produces too little of the hormones thyroxine (T4) and triiodothyronine (T3). What are the causes? This condition may be caused by:  Hashimoto's disease. This is a disease in which the body's disease-fighting system (immune system) attacks the thyroid gland. This is the most common cause.  Viral infections.  Pregnancy.  Certain medicines.  Birth defects.  Past radiation treatments to the head or neck for cancer.  Past treatment with radioactive iodine.  Past exposure to radiation in the environment.  Past surgical removal of part or all of the thyroid.  Problems with a gland in the center of the brain (pituitary gland).  Lack of enough iodine in the diet. What increases the risk? You are more likely to develop this condition if:  You are female.  You have a family history of thyroid conditions.  You use a medicine called lithium.  You take medicines that affect the immune system (immunosuppressants). What are the signs or symptoms? Symptoms of this condition include:  Feeling as though you have no energy (lethargy).  Not being able to tolerate cold.  Weight gain that is not explained by a change in diet or exercise habits.  Lack of appetite.  Dry skin.  Coarse hair.  Menstrual irregularity.  Slowing of thought processes.  Constipation.  Sadness or depression. How is  this diagnosed? This condition may be diagnosed based on:  Your symptoms, your medical history, and a physical exam.  Blood tests. You may also have imaging tests, such as an ultrasound or MRI. How is this treated? This condition is treated with medicine that replaces the thyroid hormones that your body does not make. After you begin treatment, it may take several weeks for symptoms to go away. Follow these instructions at home:  Take over-the-counter and prescription medicines only as told by your health care provider.  If you start taking any new medicines, tell your health care provider.  Keep all follow-up visits as told by your health care provider. This is important. ? As your condition improves, your dosage of thyroid hormone medicine may change. ? You will need to have blood tests regularly so that your health care provider can monitor your condition. Contact a health care provider if:  Your symptoms do not get better with treatment.  You are taking thyroid replacement medicine and you: ? Sweat a lot. ? Have tremors. ? Feel anxious. ? Lose weight rapidly. ? Cannot tolerate heat. ? Have emotional swings. ? Have diarrhea. ? Feel weak. Get help right away if you have:  Chest pain.  An irregular heartbeat.  A rapid heartbeat.  Difficulty breathing. Summary  Hypothyroidism is when the thyroid gland does not make enough of certain hormones (it is underactive).  When the thyroid is underactive, it produces too little of the hormones thyroxine (T4) and triiodothyronine (T3).  The most common cause is Hashimoto's  disease, a disease in which the body's disease-fighting system (immune system) attacks the thyroid gland. The condition can also be caused by viral infections, medicine, pregnancy, or past radiation treatment to the head or neck.  Symptoms may include weight gain, dry skin, constipation, feeling as though you do not have energy, and not being able to tolerate  cold.  This condition is treated with medicine to replace the thyroid hormones that your body does not make. This information is not intended to replace advice given to you by your health care provider. Make sure you discuss any questions you have with your health care provider. Document Revised: 07/14/2017 Document Reviewed: 07/12/2017 Elsevier Patient Education  2020 [employer].

## 2020-04-04 DIAGNOSIS — E039 Hypothyroidism, unspecified: Secondary | ICD-10-CM | POA: Insufficient documentation

## 2020-04-10 DIAGNOSIS — Z1211 Encounter for screening for malignant neoplasm of colon: Secondary | ICD-10-CM | POA: Diagnosis not present

## 2020-04-13 LAB — FECAL OCCULT BLOOD, IMMUNOCHEMICAL: Fecal Occult Bld: NEGATIVE

## 2020-05-30 ENCOUNTER — Ambulatory Visit: Payer: PPO | Attending: Internal Medicine

## 2020-05-30 ENCOUNTER — Other Ambulatory Visit: Payer: Self-pay

## 2020-05-30 DIAGNOSIS — Z23 Encounter for immunization: Secondary | ICD-10-CM

## 2020-05-30 NOTE — Progress Notes (Signed)
   Covid-19 Vaccination Clinic  Name:  Michelle Sosa    MRN: [id number] DOB: 1945-12-10  05/30/2020  [name] was observed post Covid-19 immunization for 15 minutes without incident. She was provided with Vaccine Information Sheet and instruction to access the V-Safe system.   [name] was instructed to call 911 with any severe reactions post vaccine: Marland Kitchen Difficulty breathing  . Swelling of face and throat  . A fast heartbeat  . A bad rash all over body  . Dizziness and weakness

## 2020-06-26 ENCOUNTER — Ambulatory Visit: Payer: PPO | Attending: Nurse Practitioner | Admitting: Nurse Practitioner

## 2020-06-26 ENCOUNTER — Other Ambulatory Visit: Payer: Self-pay | Admitting: Nurse Practitioner

## 2020-06-26 ENCOUNTER — Encounter: Payer: Self-pay | Admitting: Nurse Practitioner

## 2020-06-26 ENCOUNTER — Other Ambulatory Visit: Payer: Self-pay

## 2020-06-26 DIAGNOSIS — I10 Essential (primary) hypertension: Secondary | ICD-10-CM

## 2020-06-26 MED ORDER — IRBESARTAN 300 MG PO TABS: 300.0000 mg | ORAL_TABLET | Freq: Every day | ORAL | 1 refills | Status: DC

## 2020-06-26 MED ORDER — HYDROCHLOROTHIAZIDE 12.5 MG PO TABS: 12.5000 mg | ORAL_TABLET | Freq: Every day | ORAL | 1 refills | Status: DC

## 2020-06-26 MED ORDER — AMLODIPINE BESYLATE 10 MG PO TABS: 10.0000 mg | ORAL_TABLET | Freq: Every day | ORAL | 1 refills | Status: DC

## 2020-06-26 NOTE — Progress Notes (Signed)
Needs refills on medications.  BP-129/76, HR-64

## 2020-06-26 NOTE — Progress Notes (Signed)
Virtual Visit via Telephone Note Due to national recommendations of social distancing due to [place] 19, telehealth visit is felt to be most appropriate for this patient at this time.  I discussed the limitations, risks, security and privacy concerns of performing an evaluation and management service by telephone and the availability of in person appointments. I also discussed with the patient that there may be a patient responsible charge related to this service. The patient expressed understanding and agreed to proceed.    I connected with Nathanial Rancher on 06/26/20  at   9:10 AM EST  EDT by telephone and verified that I am speaking with the correct person using two identifiers.   Consent I discussed the limitations, risks, security and privacy concerns of performing an evaluation and management service by telephone and the availability of in person appointments. I also discussed with the patient that there may be a patient responsible charge related to this service. The patient expressed understanding and agreed to proceed.   Location of Patient: Private  Residence   Location of Provider: [place] and [employer] Office    Persons participating in Telemedicine visit: [name] FNP-BC [place]    History of Present Illness: Telemedicine visit for: Follow Up Patient has been counseled on age-appropriate routine health concerns for screening and prevention. These are reviewed and up-to-date. Referrals have been placed accordingly. Immunizations are up-to-date or declined.    Declines biophosphnates for osteoporosis.   She had a question regarding taking aspirin at her age.  I did instruct her that aspirin is recommended for anyone who has a history of MI or nonhemorrhagic stroke.   She notes experiencing symptoms of severe cold for 4-5 days in September. No fever.  She did not seek treatment for her symptoms however she did isolate herself at home  for several days.  She received the [employer] in October. Symptoms have resolved since then and she has no residual symptoms.   ESSENTIAL HYPERTENSION She is monitoring her blood pressure at home with systolic readings mostly in the 120s.  She has taken an occasional systolic reading in the 458K however this is not often.  She is taking amlodipine 10 mg daily, hydrochlorothiazide 12.5 mg daily and Avapro 300 mg daily as prescribed. Denies chest pain, shortness of breath, palpitations, lightheadedness, dizziness, headaches or BLE edema.  BP Readings from Last 3 Encounters:  04/01/20 (!) 142/70  03/25/20 (!) 147/76  01/08/20 124/74   She is seeing the endocrinologist for her elevated thyroid levels.  At this time they are just monitoring her levels and the decision has not been made at this time to start her on any thyroid medications.   Past Medical History:  Diagnosis Date  . Hepatitis C   . Hypertension   . Stroke [facility])     Past Surgical History:  Procedure Laterality Date  . KNEE SURGERY  2005  . LEG SURGERY     right leg, a rod.  Marland Kitchen MASTECTOMY Right   . WRIST SURGERY  2008    Family History  Problem Relation Age of Onset  . Heart disease Father   . Hypothyroidism Sister   . Breast cancer Neg Hx     Social History   Socioeconomic History  . Marital status: Single    Spouse name: Not on file  . Number of children: Not on file  . Years of education: Not on file  . Highest education level: Not on file  Occupational History  . Not on file  Tobacco Use  . Smoking status: Former Smoker    Types: Cigarettes    Quit date: 02/14/2011    Years since quitting: 9.3  . Smokeless tobacco: Never Used  Vaping Use  . Vaping Use: Never used  Substance and Sexual Activity  . Alcohol use: No  . Drug use: No  . Sexual activity: Not Currently  Other Topics Concern  . Not on file  Social History Narrative  . Not on file   Social Determinants of Health   Financial Resource  Strain:   . Difficulty of Paying Living Expenses: Not on file  Food Insecurity:   . Worried About Charity fundraiser in the Last Year: Not on file  . Ran Out of Food in the Last Year: Not on file  Transportation Needs:   . Lack of Transportation (Medical): Not on file  . Lack of Transportation (Non-Medical): Not on file  Physical Activity:   . Days of Exercise per Week: Not on file  . Minutes of Exercise per Session: Not on file  Stress:   . Feeling of Stress : Not on file  Social Connections:   . Frequency of Communication with Friends and Family: Not on file  . Frequency of Social Gatherings with Friends and Family: Not on file  . Attends Religious Services: Not on file  . Active Member of Clubs or Organizations: Not on file  . Attends Archivist Meetings: Not on file  . Marital Status: Not on file     Observations/Objective: Awake, alert and oriented x 3   Review of Systems  Constitutional: Negative for fever, malaise/fatigue and weight loss.  HENT: Negative.  Negative for nosebleeds.   Eyes: Negative.  Negative for blurred vision, double vision and photophobia.  Respiratory: Negative.  Negative for cough and shortness of breath.   Cardiovascular: Negative.  Negative for chest pain, palpitations and leg swelling.  Gastrointestinal: Negative.  Negative for heartburn, nausea and vomiting.  Musculoskeletal: Negative.  Negative for myalgias.  Neurological: Negative.  Negative for dizziness, focal weakness, seizures and headaches.  Psychiatric/Behavioral: Negative.  Negative for suicidal ideas.    Assessment and Plan: Lynae was seen today for hypertension.  Diagnoses and all orders for this visit:  Essential hypertension -     hydrochlorothiazide (HYDRODIURIL) 12.5 MG tablet; Take 1 tablet (12.5 mg total) by mouth daily. -     amLODipine (NORVASC) 10 MG tablet; Take 1 tablet (10 mg total) by mouth daily. -     irbesartan (AVAPRO) 300 MG tablet; Take 1 tablet (300  mg total) by mouth daily. Continue all antihypertensives as prescribed.  Remember to bring in your blood pressure log with you for your follow up appointment.  DASH/Mediterranean Diets are healthier choices for HTN.     Follow Up Instructions Return in about 3 months (around 09/26/2020).     I discussed the assessment and treatment plan with the patient. The patient was provided an opportunity to ask questions and all were answered. The patient agreed with the plan and demonstrated an understanding of the instructions.   The patient was advised to call back or seek an in-person evaluation if the symptoms worsen or if the condition fails to improve as anticipated.  I provided 15 minutes of non-face-to-face time during this encounter including median intraservice time, reviewing previous notes, labs, imaging, medications and explaining diagnosis and management.  Gildardo Pounds, FNP-BC

## 2020-07-02 ENCOUNTER — Other Ambulatory Visit: Payer: Self-pay

## 2020-07-02 ENCOUNTER — Encounter: Payer: Self-pay | Admitting: Endocrinology

## 2020-07-02 ENCOUNTER — Ambulatory Visit: Payer: PPO | Admitting: Endocrinology

## 2020-07-02 VITALS — BP 154/78 | HR 78 | Ht 63.0 in | Wt 163.4 lb

## 2020-07-02 DIAGNOSIS — E039 Hypothyroidism, unspecified: Secondary | ICD-10-CM

## 2020-07-02 LAB — TSH: TSH: 4.22 u[IU]/mL (ref 0.35–4.50)

## 2020-07-02 LAB — T4, FREE: Free T4: 1 ng/dL (ref 0.60–1.60)

## 2020-07-02 NOTE — Progress Notes (Signed)
Subjective:    Patient ID: Michelle Sosa, female    DOB: 1945-11-26, 74 y.o.   MRN: [id number]  HPI Pt returns for f/u of chronic primary hypothyroidism (dx'ed in 2011; she has never been on prescribed thyroid hormone therapy, and she declines; she has never had thyroid imaging).  pt states she feels well in general, except for weight gain.   Past Medical History:  Diagnosis Date  . Hepatitis C   . Hypertension   . Stroke [facility])     Past Surgical History:  Procedure Laterality Date  . KNEE SURGERY  2005  . LEG SURGERY     right leg, a rod.  Marland Kitchen MASTECTOMY Right   . WRIST SURGERY  2008    Social History   Socioeconomic History  . Marital status: Single    Spouse name: Not on file  . Number of children: Not on file  . Years of education: Not on file  . Highest education level: Not on file  Occupational History  . Not on file  Tobacco Use  . Smoking status: Former Smoker    Types: Cigarettes    Quit date: 02/14/2011    Years since quitting: 9.3  . Smokeless tobacco: Never Used  Vaping Use  . Vaping Use: Never used  Substance and Sexual Activity  . Alcohol use: No  . Drug use: No  . Sexual activity: Not Currently  Other Topics Concern  . Not on file  Social History Narrative  . Not on file   Social Determinants of Health   Financial Resource Strain:   . Difficulty of Paying Living Expenses: Not on file  Food Insecurity:   . Worried About Charity fundraiser in the Last Year: Not on file  . Ran Out of Food in the Last Year: Not on file  Transportation Needs:   . Lack of Transportation (Medical): Not on file  . Lack of Transportation (Non-Medical): Not on file  Physical Activity:   . Days of Exercise per Week: Not on file  . Minutes of Exercise per Session: Not on file  Stress:   . Feeling of Stress : Not on file  Social Connections:   . Frequency of Communication with Friends and Family: Not on file  . Frequency of Social Gatherings with Friends and  Family: Not on file  . Attends Religious Services: Not on file  . Active Member of Clubs or Organizations: Not on file  . Attends Archivist Meetings: Not on file  . Marital Status: Not on file  Intimate Partner Violence:   . Fear of Current or Ex-Partner: Not on file  . Emotionally Abused: Not on file  . Physically Abused: Not on file  . Sexually Abused: Not on file    Current Outpatient Medications on File Prior to Visit  Medication Sig Dispense Refill  . amLODipine (NORVASC) 10 MG tablet Take 1 tablet (10 mg total) by mouth daily. 90 tablet 1  . ASPIRIN 81 PO Take 1 tablet by mouth daily.     . Calcium Carbonate-Vitamin D3 (CALCIUM 600-D) 600-400 MG-UNIT TABS Take 1 tablet by mouth 2 (two) times daily.     . hydrochlorothiazide (HYDRODIURIL) 12.5 MG tablet Take 1 tablet (12.5 mg total) by mouth daily. 90 tablet 1  . irbesartan (AVAPRO) 300 MG tablet Take 1 tablet (300 mg total) by mouth daily. 90 tablet 1  . Multiple Vitamins-Minerals (CENTRUM SILVER 50+WOMEN PO) Take 1 tablet by mouth daily.     Marland Kitchen  Omega-3 Fatty Acids (OMEGA-3 FISH OIL PO) Take 2 tablets by mouth daily.    Marland Kitchen thiamine (VITAMIN B-1) 100 MG tablet Take 100 mg by mouth daily.    . vitamin B-12 (CYANOCOBALAMIN) 100 MCG tablet Take 100 mcg by mouth daily.    Marland Kitchen FLUZONE HIGH-DOSE QUADRIVALENT 0.7 ML SUSY      No current facility-administered medications on file prior to visit.    Allergies  Allergen Reactions  . Metoprolol Tartrate Other (See Comments)    Affects her mood/ lability  . Cipro [Ciprofloxacin Hcl]     Joint and muscle pain  . Codeine Other (See Comments)  . Penicillins Other (See Comments)  . Sulfa Antibiotics     States she can take Bactrim    Family History  Problem Relation Age of Onset  . Heart disease Father   . Hypothyroidism Sister   . Breast cancer Neg Hx     BP (!) 154/78   Pulse 78   Ht 5\' 3"  (1.6 m)   Wt 163 lb 6.4 oz (74.1 kg)   SpO2 97%   BMI 28.95 kg/m    Review  of Systems     Objective:   Physical Exam VITAL SIGNS:  See vs page GENERAL: no distress NECK: There is no palpable thyroid enlargement.  No thyroid nodule is palpable.  No palpable lymphadenopathy at the anterior neck.  Lab Results  Component Value Date   TSH 4.22 07/02/2020   T3TOTAL 124 04/24/2017   T4TOTAL 11.3 02/03/2020       Assessment & Plan:  Hypothyroidism: stable off rx.  We discussed the fact that this will recur, but she still declines rx.

## 2020-07-02 NOTE — Patient Instructions (Addendum)
Your blood pressure is high today.  Please see your primary care provider soon, to have it rechecked Blood tests are requested for you today.  We'll let you know about the results.   Please come back for a follow-up appointment in 6 months.   

## 2020-09-25 ENCOUNTER — Other Ambulatory Visit: Payer: Self-pay | Admitting: Nurse Practitioner

## 2020-09-25 ENCOUNTER — Ambulatory Visit: Payer: PPO | Attending: Nurse Practitioner | Admitting: Nurse Practitioner

## 2020-09-25 ENCOUNTER — Other Ambulatory Visit: Payer: Self-pay

## 2020-09-25 ENCOUNTER — Telehealth: Payer: Self-pay | Admitting: Nurse Practitioner

## 2020-09-25 ENCOUNTER — Encounter: Payer: Self-pay | Admitting: Nurse Practitioner

## 2020-09-25 VITALS — BP 169/82 | HR 91 | Resp 16 | Wt 159.4 lb

## 2020-09-25 DIAGNOSIS — Z1231 Encounter for screening mammogram for malignant neoplasm of breast: Secondary | ICD-10-CM

## 2020-09-25 DIAGNOSIS — R399 Unspecified symptoms and signs involving the genitourinary system: Secondary | ICD-10-CM | POA: Diagnosis not present

## 2020-09-25 DIAGNOSIS — I10 Essential (primary) hypertension: Secondary | ICD-10-CM | POA: Diagnosis not present

## 2020-09-25 LAB — POCT URINALYSIS DIP (CLINITEK)
Bilirubin, UA: NEGATIVE
Blood, UA: NEGATIVE
Glucose, UA: NEGATIVE mg/dL
Ketones, POC UA: NEGATIVE mg/dL
Leukocytes, UA: NEGATIVE
Nitrite, UA: NEGATIVE
POC PROTEIN,UA: NEGATIVE
Spec Grav, UA: 1.01 (ref 1.010–1.025)
Urobilinogen, UA: 0.2 E.U./dL
pH, UA: 5.5 (ref 5.0–8.0)

## 2020-09-25 MED ORDER — CHLORTHALIDONE 15 MG PO TABS: 15.0000 mg | ORAL_TABLET | Freq: Every day | ORAL | 1 refills | Status: DC

## 2020-09-25 MED ORDER — CHLORTHALIDONE 25 MG PO TABS: 25.0000 mg | ORAL_TABLET | Freq: Every day | ORAL | 1 refills | Status: DC

## 2020-09-25 MED ORDER — AMLODIPINE BESYLATE 10 MG PO TABS: 10.0000 mg | ORAL_TABLET | Freq: Every day | ORAL | 1 refills | Status: DC

## 2020-09-25 MED ORDER — IRBESARTAN 300 MG PO TABS: 300.0000 mg | ORAL_TABLET | Freq: Every day | ORAL | 1 refills | Status: DC

## 2020-09-25 NOTE — Telephone Encounter (Signed)
Copied from [place] [id number]. Topic: General - Inquiry >> Sep 25, 2020  4:32 PM Pawlus, Brayton Layman A wrote: Reason for CRM: Rob from [employer] called asking for some clarification regarding medications just sent in. Please provide clarifiaction regarding chlorthalidone (HYGROTEN) 15 MG tablet  and  chlorthalidone (HYGROTON) 25 MG tablet, as he wanted to know which one to fill.

## 2020-09-25 NOTE — Progress Notes (Signed)
Assessment & Plan:  Michelle Sosa was seen today for hypertension.  Diagnoses and all orders for this visit:  Essential hypertension -     chlorthalidone (HYGROTEN) 15 MG tablet; Take 1 tablet (15 mg total) by mouth daily. -     irbesartan (AVAPRO) 300 MG tablet; Take 1 tablet (300 mg total) by mouth daily. -     amLODipine (NORVASC) 10 MG tablet; Take 1 tablet (10 mg total) by mouth daily. -     CMP14+EGFR Continue all antihypertensives as prescribed.  Remember to bring in your blood pressure log with you for your follow up appointment.  DASH/Mediterranean Diets are healthier choices for HTN.    UTI symptoms -     POCT URINALYSIS DIP (CLINITEK)  Breast cancer screening by mammogram -     MM 3D SCREEN BREAST BILATERAL; Future     Patient has been counseled on age-appropriate routine health concerns for screening and prevention. These are reviewed and up-to-date. Referrals have been placed accordingly. Immunizations are up-to-date or declined.    Subjective:   Chief Complaint  Patient presents with  . Hypertension   HPI Michelle Sosa 75 y.o. female presents to office today for follow up. She has a past medical history of Hepatitis C, Hypertension, and Stroke ([place]).  She is seeing Dr Loanne Drilling for her hypothyroidism  Essential Hypertension Blood pressure is not controlled here in the office. She is currently taking HCTZ 12.5 mg. Will switch to chlorthalidone 15 mg daily. She will continue on amlodipine 10 mg and irbesartan 300 mg daily. Average home readings. [phone].Denies chest pain, shortness of breath, palpitations, lightheadedness, dizziness, headaches or BLE edema.  BP Readings from Last 3 Encounters:  09/25/20 (!) 169/82  07/02/20 (!) 154/78  04/01/20 (!) 142/70   UTI symptoms Urinary frequency. Denies dysuria, hematuria, flank pain or pelvic pressure.  She has been drinking cranberry juice prior for prevention of UTI symptoms however she stopped drinking  cranberry juice over the past several months due to carb content. Noted symptoms of UTI have returned since then.   Review of Systems  Constitutional: Negative for fever, malaise/fatigue and weight loss.  HENT: Negative.  Negative for nosebleeds.   Eyes: Negative.  Negative for blurred vision, double vision and photophobia.  Respiratory: Negative.  Negative for cough and shortness of breath.   Cardiovascular: Negative.  Negative for chest pain, palpitations and leg swelling.  Gastrointestinal: Negative.  Negative for heartburn, nausea and vomiting.  Genitourinary: Positive for frequency.  Musculoskeletal: Negative.  Negative for myalgias.  Neurological: Negative.  Negative for dizziness, focal weakness, seizures and headaches.  Psychiatric/Behavioral: Negative.  Negative for suicidal ideas.    Past Medical History:  Diagnosis Date  . Hepatitis C   . Hypertension   . Stroke [facility])     Past Surgical History:  Procedure Laterality Date  . KNEE SURGERY  2005  . LEG SURGERY     right leg, a rod.  Marland Kitchen MASTECTOMY Right   . WRIST SURGERY  2008    Family History  Problem Relation Age of Onset  . Heart disease Father   . Hypothyroidism Sister   . Breast cancer Neg Hx     Social History Reviewed with no changes to be made today.   Outpatient Medications Prior to Visit  Medication Sig Dispense Refill  . ASPIRIN 81 PO Take 1 tablet by mouth daily.     . Calcium Carbonate-Vitamin D3 600-400 MG-UNIT TABS Take 1 tablet by mouth 2 (two) times  daily.     . Multiple Vitamins-Minerals (CENTRUM SILVER 50+WOMEN PO) Take 1 tablet by mouth daily.     . Omega-3 Fatty Acids (OMEGA-3 FISH OIL PO) Take 2 tablets by mouth daily.    Marland Kitchen thiamine (VITAMIN B-1) 100 MG tablet Take 100 mg by mouth daily.    . vitamin B-12 (CYANOCOBALAMIN) 100 MCG tablet Take 100 mcg by mouth daily.    Marland Kitchen amLODipine (NORVASC) 10 MG tablet Take 1 tablet (10 mg total) by mouth daily. 90 tablet 1  . irbesartan (AVAPRO) 300 MG  tablet Take 1 tablet (300 mg total) by mouth daily. 90 tablet 1  . FLUZONE HIGH-DOSE QUADRIVALENT 0.7 ML SUSY  (Patient not taking: Reported on 09/25/2020)    . hydrochlorothiazide (HYDRODIURIL) 12.5 MG tablet Take 1 tablet (12.5 mg total) by mouth daily. 90 tablet 1   No facility-administered medications prior to visit.    Allergies  Allergen Reactions  . Metoprolol Tartrate Other (See Comments)    Affects her mood/ lability  . Cipro [Ciprofloxacin Hcl]     Joint and muscle pain  . Codeine Other (See Comments)  . Penicillins Other (See Comments)  . Sulfa Antibiotics     States she can take Bactrim       Objective:    BP (!) 169/82   Pulse 91   Resp 16   Wt 159 lb 6.4 oz (72.3 kg)   SpO2 96%   BMI 28.24 kg/m  Wt Readings from Last 3 Encounters:  09/25/20 159 lb 6.4 oz (72.3 kg)  07/02/20 163 lb 6.4 oz (74.1 kg)  04/01/20 165 lb 3.2 oz (74.9 kg)    Physical Exam Vitals and nursing note reviewed.  Constitutional:      Appearance: She is well-developed and well-nourished.  HENT:     Head: Normocephalic and atraumatic.  Eyes:     Extraocular Movements: EOM normal.  Cardiovascular:     Rate and Rhythm: Normal rate and regular rhythm.     Pulses: Intact distal pulses.     Heart sounds: Normal heart sounds. No murmur heard. No friction rub. No gallop.   Pulmonary:     Effort: Pulmonary effort is normal. No tachypnea or respiratory distress.     Breath sounds: Normal breath sounds. No decreased breath sounds, wheezing, rhonchi or rales.  Chest:     Chest wall: No tenderness.  Abdominal:     General: Bowel sounds are normal.     Palpations: Abdomen is soft.  Musculoskeletal:        General: No edema. Normal range of motion.     Cervical back: Normal range of motion.  Skin:    General: Skin is warm and dry.  Neurological:     Mental Status: She is alert and oriented to person, place, and time.     Coordination: Coordination normal.  Psychiatric:        Mood and  Affect: Mood and affect normal.        Behavior: Behavior normal. Behavior is cooperative.        Thought Content: Thought content normal.        Judgment: Judgment normal.          Patient has been counseled extensively about nutrition and exercise as well as the importance of adherence with medications and regular follow-up. The patient was given clear instructions to go to ER or return to medical center if symptoms don't improve, worsen or new problems develop. The patient verbalized understanding.  Follow-up: Return in about 3 weeks (around 10/16/2020) for BP CHECK WITH LUKE and BMP. See me for physical at her convenience.   Gildardo Pounds, FNP-BC [facility] and [place], [place]   09/25/2020, 2:22 PM

## 2020-09-26 LAB — CMP14+EGFR
ALT: 24 IU/L (ref 0–32)
AST: 28 IU/L (ref 0–40)
Albumin/Globulin Ratio: 1.3 (ref 1.2–2.2)
Albumin: 4.6 g/dL (ref 3.7–4.7)
Alkaline Phosphatase: 98 IU/L (ref 44–121)
BUN/Creatinine Ratio: 22 (ref 12–28)
BUN: 14 mg/dL (ref 8–27)
Bilirubin Total: 0.5 mg/dL (ref 0.0–1.2)
CO2: 18 mmol/L — ABNORMAL LOW (ref 20–29)
Calcium: 10 mg/dL (ref 8.7–10.3)
Chloride: 98 mmol/L (ref 96–106)
Creatinine, Ser: 0.64 mg/dL (ref 0.57–1.00)
GFR calc Af Amer: 103 mL/min/{1.73_m2} (ref >59)
GFR calc non Af Amer: 89 mL/min/{1.73_m2} (ref >59)
Globulin, Total: 3.5 g/dL (ref 1.5–4.5)
Glucose: 103 mg/dL — ABNORMAL HIGH (ref 65–99)
Potassium: 4.1 mmol/L (ref 3.5–5.2)
Sodium: 136 mmol/L (ref 134–144)
Total Protein: 8.1 g/dL (ref 6.0–8.5)

## 2020-09-28 ENCOUNTER — Ambulatory Visit: Payer: Self-pay | Admitting: *Deleted

## 2020-09-28 NOTE — Telephone Encounter (Signed)
Per [name], NP, patient's PCP, patient should be on chlorthalidone 15 mg.  Called patient pharmacy to inform of correct dosage.   Per Pharmacy, a Prior Auth will be needed for this dosage.

## 2020-09-28 NOTE — Telephone Encounter (Signed)
Per [name], NP, patient's PCP, patient should be on chlorthalidone 15 mg.  Called patient pharmacy to inform of correct dosage.   A prior authorization is needed. A previous message pertaining to the same information will be routed.

## 2020-09-28 NOTE — Telephone Encounter (Signed)
Relation to pt: self  Call back number: [phone]  Pharmacy:  [facility] [place], [place] [address] Phone:  [phone]  Fax:  [phone]       Reason for call:  Patient calling seeking clarity regarding which medication should she be taking chlorthalidone (HYGROTON) 25 MG tablet and chlorthalidone (HYGROTEN) 15 MG tablet, please follow up today with patient and okay to leave a detail message. Patient would like nurse to contact pharmacy and advise what Rx is the correct one

## 2020-09-28 NOTE — Telephone Encounter (Signed)
Patient returned call- she has been notified by the pharmacy about the 2 different dosings. Reviewed note in chart with patient. Per note the dosing is 15mg  and she is to stop HCTZ. The office will notify the pharmacy of correct dosing.

## 2020-09-28 NOTE — Telephone Encounter (Signed)
See previous note; will close encounter

## 2020-09-28 NOTE — Telephone Encounter (Signed)
Per initial encounter, "Pharmacy, Rob is calling, has called 2 times last week, no response. Two (2) scripts written for same except dosage on 2/11 chlorthalidone (HYGROTEN) 15 MG tablet 30 tablet 1 09/25/2020 10/25/2020 Or chlorthalidone (HYGROTON) 25 MG tablet 90 tablet 0 09/25/2020 "; spoke with Carolyn Stare, Pharmacist and he says that 2 different scripts were sent for medication that day (15 mg and 25 mg); not sure which is needed; reviewed visit note and those 2 scripts were sent around appt time; Rob also says if the Chlorthalidone 15 mg is needed, a prior authorization is needed; will route to office for provider clarification; attempted to notify pt; left message on voicemail to call office.  Reason for Disposition . [1] Pharmacy calling with prescription question AND [2] triager unable to answer question  Answer Assessment - Initial Assessment Questions 1. NAME of MEDICATION: "What medicine are you calling about?"     chlorthalidone 2. QUESTION: "What is your question?" (e.g., medication refill, side effect)     Which dose is needed 15 mg or 25 mg? 3. PRESCRIBING HCP: "Who prescribed it?" Reason: if prescribed by specialist, call should be referred to that group.     Zelda Fleming 4. SYMPTOMS: "Do you have any symptoms?"     n/a 5. SEVERITY: If symptoms are present, ask "Are they mild, moderate or severe?"  n/a 6. PREGNANCY:  "Is there any chance that you are pregnant?" "When was your last menstrual period?"  Protocols used: MEDICATION QUESTION CALL-A-AH

## 2020-09-29 ENCOUNTER — Ambulatory Visit: Payer: Self-pay | Admitting: *Deleted

## 2020-09-29 NOTE — Telephone Encounter (Signed)
Pt calling in regarding a medication prescribed for her by [name] NP on Friday.    It's the chlorthalidone 15 mg.  I was researching this drug and I have a sulfa allergy.   I break out with sulfa drugs.   According to my research I'm not supposed to take this medication if I have a sulfa allergy.     I have not taken it because I haven't picked it up yet.  There was a question regarding the dose 15 or 25 mg.  It's a mute point anyway because I'm allergic to sulfa drugs.  I let her know I would pass this information on the [name], NP and someone would call her back with Zilda's decision.  Pt can be reached at [phone].  I forwarded my notes to [facility] for [name], NP.

## 2020-10-04 NOTE — Telephone Encounter (Signed)
Need contact number for prior auth

## 2020-10-16 ENCOUNTER — Encounter: Payer: Self-pay | Admitting: Pharmacist

## 2020-10-16 ENCOUNTER — Ambulatory Visit: Payer: PPO | Attending: Nurse Practitioner | Admitting: Pharmacist

## 2020-10-16 ENCOUNTER — Other Ambulatory Visit: Payer: Self-pay

## 2020-10-16 DIAGNOSIS — I10 Essential (primary) hypertension: Secondary | ICD-10-CM

## 2020-10-16 MED ORDER — HYDROCHLOROTHIAZIDE 12.5 MG PO CAPS: 12.5000 mg | ORAL_CAPSULE | Freq: Every day | ORAL | 1 refills | Status: DC

## 2020-10-16 NOTE — Progress Notes (Signed)
   S:    PCP: Zelda   Patient arrives in good spirits. Presents to the clinic for hypertension evaluation, counseling, and management. Patient was referred and last seen by Primary Care Provider on 09/25/2020. At that visit, Zelda stopped HCTZ and added chlorthalidone.   Medication adherence reported, however, pt continues to take HCTZ. There was an issue with her pharmacy and she was not able to pick up her chlorthalidone. She is compliant with her Avapro and amlodipine.   Current BP Medications include:  Amlodipine 10 mg daily, HCTZ 25 mg daily irbesartan 300 mg daily   Dietary habits include: endorses compliance with salt restriction; denies drinking excess caffeine  Exercise habits include: limited  Family / Social history:  FHx: heart disease, hypothyroidism Tobacco: former smoker (quit in 2012) Alcohol: denies use  O:  Vitals:   10/16/20 1401  BP: (!) 151/76  Pulse: 76    Home BP readings:  Brings a log with her  SBP range: 108 - 134 (most values are in the 120s) DBP range: 66 - 82 (most values are in the 60s, 70s)  Last 3 Office BP readings: BP Readings from Last 3 Encounters:  10/16/20 (!) 151/76  09/25/20 (!) 169/82  07/02/20 (!) 154/78    BMET    Component Value Date/Time   NA 136 09/25/2020 1430   K 4.1 09/25/2020 1430   CL 98 09/25/2020 1430   CO2 18 (L) 09/25/2020 1430   GLUCOSE 103 (H) 09/25/2020 1430   GLUCOSE 85 01/07/2016 1528   BUN 14 09/25/2020 1430   CREATININE 0.64 09/25/2020 1430   CREATININE 0.62 01/07/2016 1528   CALCIUM 10.0 09/25/2020 1430   GFRNONAA 89 09/25/2020 1430   GFRNONAA >89 01/07/2016 1528   GFRAA 103 09/25/2020 1430   GFRAA >89 01/07/2016 1528    Renal function: CrCl cannot be calculated (Unknown ideal weight.).  Clinical ASCVD: Yes - hx of stroke    A/P: Hypertension longstanding currently above goal on current medications. BP Goal = < 130/80 mmHg. Medication adherence reported. BP at home is at goal.   -Continued  current regimen. -Reordered HCTZ and removed chlorthalidone from her list.  -Counseled on lifestyle modifications for blood pressure control including reduced dietary sodium, increased exercise, adequate sleep.  Results reviewed and written information provided.   Total time in face-to-face counseling 30 minutes.   F/U Clinic Visit with PCP.   Benard Halsted, PharmD, [facility] [place], [place] [phone]

## 2020-12-30 ENCOUNTER — Other Ambulatory Visit: Payer: Self-pay

## 2020-12-30 ENCOUNTER — Ambulatory Visit: Payer: PPO | Admitting: Endocrinology

## 2020-12-30 VITALS — BP 140/60 | HR 80 | Ht 63.0 in | Wt 155.0 lb

## 2020-12-30 DIAGNOSIS — E039 Hypothyroidism, unspecified: Secondary | ICD-10-CM

## 2020-12-30 NOTE — Progress Notes (Signed)
Subjective:    Patient ID: Michelle Sosa, female    DOB: Mar 10, 1946, 75 y.o.   MRN: [id number]  HPI Pt returns for f/u of chronic primary hypothyroidism (dx'ed in 2011; she has never been on prescribed thyroid hormone therapy, and she declines; she has never had thyroid imaging).  pt states she feels well in general.  She has lost a few lbs--intentional.   Past Medical History:  Diagnosis Date  . Hepatitis C   . Hypertension   . Stroke [facility])     Past Surgical History:  Procedure Laterality Date  . KNEE SURGERY  2005  . LEG SURGERY     right leg, a rod.  Marland Kitchen MASTECTOMY Right   . WRIST SURGERY  2008    Social History   Socioeconomic History  . Marital status: Single    Spouse name: Not on file  . Number of children: Not on file  . Years of education: Not on file  . Highest education level: Not on file  Occupational History  . Not on file  Tobacco Use  . Smoking status: Former Smoker    Types: Cigarettes    Quit date: 02/14/2011    Years since quitting: 9.8  . Smokeless tobacco: Never Used  Vaping Use  . Vaping Use: Never used  Substance and Sexual Activity  . Alcohol use: No  . Drug use: No  . Sexual activity: Not Currently  Other Topics Concern  . Not on file  Social History Narrative  . Not on file   Social Determinants of Health   Financial Resource Strain: Not on file  Food Insecurity: Not on file  Transportation Needs: Not on file  Physical Activity: Not on file  Stress: Not on file  Social Connections: Not on file  Intimate Partner Violence: Not on file    Current Outpatient Medications on File Prior to Visit  Medication Sig Dispense Refill  . ASPIRIN 81 PO Take 1 tablet by mouth daily.     . Calcium Carbonate-Vitamin D3 600-400 MG-UNIT TABS Take 1 tablet by mouth 2 (two) times daily.     Marland Kitchen FLUZONE HIGH-DOSE QUADRIVALENT 0.7 ML SUSY     . hydrochlorothiazide (MICROZIDE) 12.5 MG capsule Take 1 capsule (12.5 mg total) by mouth daily. 90 capsule 1   . irbesartan (AVAPRO) 300 MG tablet Take 1 tablet (300 mg total) by mouth daily. 90 tablet 1  . Multiple Vitamins-Minerals (CENTRUM SILVER 50+WOMEN PO) Take 1 tablet by mouth daily.     . Omega-3 Fatty Acids (OMEGA-3 FISH OIL PO) Take 2 tablets by mouth daily.    Marland Kitchen thiamine (VITAMIN B-1) 100 MG tablet Take 100 mg by mouth daily.    . vitamin B-12 (CYANOCOBALAMIN) 100 MCG tablet Take 100 mcg by mouth daily.    Marland Kitchen amLODipine (NORVASC) 10 MG tablet Take 1 tablet (10 mg total) by mouth daily. 90 tablet 1   No current facility-administered medications on file prior to visit.    Allergies  Allergen Reactions  . Metoprolol Tartrate Other (See Comments)    Affects her mood/ lability  . Cipro [Ciprofloxacin Hcl]     Joint and muscle pain  . Codeine Other (See Comments)  . Penicillins Other (See Comments)  . Sulfa Antibiotics     States she can take Bactrim    Family History  Problem Relation Age of Onset  . Heart disease Father   . Hypothyroidism Sister   . Breast cancer Neg Hx  BP 140/60 (BP Location: Right Arm, Patient Position: Sitting, Cuff Size: Normal)   Pulse 80   Ht 5\' 3"  (1.6 m)   Wt 155 lb (70.3 kg)   SpO2 95%   BMI 27.46 kg/m    Review of Systems     Objective:   Physical Exam VITAL SIGNS:  See vs page GENERAL: no distress NECK: There is no palpable thyroid enlargement.  No thyroid nodule is palpable.  No palpable lymphadenopathy at the anterior neck.    Lab Results  Component Value Date   TSH 3.85 12/30/2020   T3TOTAL 124 04/24/2017   T4TOTAL 11.3 02/03/2020       Assessment & Plan:  Hypothyroidism, in remission.  I told pt no medication is needed now

## 2020-12-30 NOTE — Patient Instructions (Addendum)
Thyroid blood tests are requested for you today.  We'll let you know about the results.  If the blood tests are normal or close to normal, we'll hold off on medication.  Please come back for a follow-up appointment in 6 months.  

## 2020-12-31 LAB — TSH: TSH: 3.85 u[IU]/mL (ref 0.35–4.50)

## 2020-12-31 LAB — T4, FREE: Free T4: 0.79 ng/dL (ref 0.60–1.60)

## 2021-01-02 ENCOUNTER — Telehealth: Payer: Self-pay | Admitting: Endocrinology

## 2021-01-02 NOTE — Telephone Encounter (Signed)
please call patient: Normal--good. No medication is needed now. I'll see you next time.

## 2021-01-03 NOTE — Telephone Encounter (Signed)
Spoke with pt regarding lab results. 

## 2021-03-03 ENCOUNTER — Other Ambulatory Visit: Payer: Self-pay

## 2021-03-03 ENCOUNTER — Ambulatory Visit
Admission: RE | Admit: 2021-03-03 | Discharge: 2021-03-03 | Disposition: A | Discharge status: Home / Self Care | Payer: PPO | Source: Ambulatory Visit | Attending: Nurse Practitioner | Admitting: Nurse Practitioner

## 2021-03-03 ENCOUNTER — Other Ambulatory Visit: Payer: Self-pay | Admitting: Nurse Practitioner

## 2021-03-03 DIAGNOSIS — Z1231 Encounter for screening mammogram for malignant neoplasm of breast: Secondary | ICD-10-CM

## 2021-06-21 ENCOUNTER — Other Ambulatory Visit: Payer: Self-pay | Admitting: Family Medicine

## 2021-06-21 DIAGNOSIS — I10 Essential (primary) hypertension: Secondary | ICD-10-CM

## 2021-06-21 NOTE — Telephone Encounter (Signed)
Pt has appt 08/25/21

## 2021-06-28 ENCOUNTER — Other Ambulatory Visit: Payer: PPO

## 2021-07-01 ENCOUNTER — Other Ambulatory Visit: Payer: Self-pay

## 2021-07-01 ENCOUNTER — Ambulatory Visit (INDEPENDENT_AMBULATORY_CARE_PROVIDER_SITE_OTHER): Payer: PPO | Admitting: Endocrinology

## 2021-07-01 VITALS — BP 140/78 | HR 74 | Ht 63.0 in | Wt 147.2 lb

## 2021-07-01 DIAGNOSIS — E039 Hypothyroidism, unspecified: Secondary | ICD-10-CM | POA: Diagnosis not present

## 2021-07-01 LAB — T4, FREE: Free T4: 0.94 ng/dL (ref 0.60–1.60)

## 2021-07-01 LAB — TSH: TSH: 4.87 u[IU]/mL (ref 0.35–5.50)

## 2021-07-01 NOTE — Progress Notes (Signed)
Subjective:    Patient ID: Michelle Sosa, female    DOB: [birth year], 75 y.o.   MRN: [id number]  HPI Pt returns for f/u of chronic primary hypothyroidism (dx'ed in 2011; she has never been on prescribed thyroid hormone therapy, and she declines; she has never had thyroid imaging).  pt states she feels well in general.  She has lost a few more lbs-intentional.   Past Medical History:  Diagnosis Date   Hepatitis C    Hypertension    Stroke [facility])     Past Surgical History:  Procedure Laterality Date   KNEE SURGERY  2005   LEG SURGERY     right leg, a rod.   MASTECTOMY Right    WRIST SURGERY  2008    Social History   Socioeconomic History   Marital status: Single    Spouse name: Not on file   Number of children: Not on file   Years of education: Not on file   Highest education level: Not on file  Occupational History   Not on file  Tobacco Use   Smoking status: Former    Types: Cigarettes    Quit date: 02/14/2011    Years since quitting: 10.3   Smokeless tobacco: Never  Vaping Use   Vaping Use: Never used  Substance and Sexual Activity   Alcohol use: No   Drug use: No   Sexual activity: Not Currently  Other Topics Concern   Not on file  Social History Narrative   Not on file   Social Determinants of Health   Financial Resource Strain: Not on file  Food Insecurity: Not on file  Transportation Needs: Not on file  Physical Activity: Not on file  Stress: Not on file  Social Connections: Not on file  Intimate Partner Violence: Not on file    Current Outpatient Medications on File Prior to Visit  Medication Sig Dispense Refill   ASPIRIN 81 PO Take 1 tablet by mouth daily.      Calcium Carbonate-Vitamin D3 600-400 MG-UNIT TABS Take 1 tablet by mouth 2 (two) times daily.      FLUZONE HIGH-DOSE QUADRIVALENT 0.7 ML SUSY      hydrochlorothiazide (MICROZIDE) 12.5 MG capsule TAKE 1 CAPSULE(12.5 MG) BY MOUTH DAILY 90 capsule 0   irbesartan (AVAPRO) 300 MG tablet  Take 1 tablet (300 mg total) by mouth daily. 90 tablet 1   Multiple Vitamins-Minerals (CENTRUM SILVER 50+WOMEN PO) Take 1 tablet by mouth daily.      Omega-3 Fatty Acids (OMEGA-3 FISH OIL PO) Take 2 tablets by mouth daily.     thiamine (VITAMIN B-1) 100 MG tablet Take 100 mg by mouth daily.     vitamin B-12 (CYANOCOBALAMIN) 100 MCG tablet Take 100 mcg by mouth daily.     amLODipine (NORVASC) 10 MG tablet Take 1 tablet (10 mg total) by mouth daily. 90 tablet 1   No current facility-administered medications on file prior to visit.    Allergies  Allergen Reactions   Metoprolol Tartrate Other (See Comments)    Affects her mood/ lability   Cipro [Ciprofloxacin Hcl]     Joint and muscle pain   Codeine Other (See Comments)   Penicillins Other (See Comments)   Sulfa Antibiotics     States she can take Bactrim    Family History  Problem Relation Age of Onset   Heart disease Father    Hypothyroidism Sister    Breast cancer Neg Hx     BP 140/78 (  BP Location: Left Arm, Patient Position: Sitting, Cuff Size: Normal)   Pulse 74   Ht 5\' 3"  (1.6 m)   Wt 147 lb 3.2 oz (66.8 kg)   SpO2 95%   BMI 26.08 kg/m    Review of Systems     Objective:   Physical Exam VITAL SIGNS:  See vs page GENERAL: no distress NECK: There is no palpable thyroid enlargement.  No thyroid nodule is palpable.  No palpable lymphadenopathy at the anterior neck.    Lab Results  Component Value Date   TSH 4.87 07/01/2021   T3TOTAL 124 04/24/2017   T4TOTAL 11.3 02/03/2020      Assessment & Plan:  Hypothyroidism: stable off rx.  I told pt no medication is needed now.

## 2021-07-01 NOTE — Patient Instructions (Signed)
Thyroid blood tests are requested for you today.  We'll let you know about the results.  If the blood tests are normal or close to normal, we'll hold off on medication.  Please come back for a follow-up appointment in 6 months.

## 2021-08-25 ENCOUNTER — Encounter: Payer: Self-pay | Admitting: Nurse Practitioner

## 2021-08-25 ENCOUNTER — Ambulatory Visit: Payer: PPO | Attending: Nurse Practitioner | Admitting: Nurse Practitioner

## 2021-08-25 DIAGNOSIS — R768 Other specified abnormal immunological findings in serum: Secondary | ICD-10-CM

## 2021-08-25 DIAGNOSIS — Z1211 Encounter for screening for malignant neoplasm of colon: Secondary | ICD-10-CM | POA: Diagnosis not present

## 2021-08-25 DIAGNOSIS — E785 Hyperlipidemia, unspecified: Secondary | ICD-10-CM | POA: Diagnosis not present

## 2021-08-25 DIAGNOSIS — I10 Essential (primary) hypertension: Secondary | ICD-10-CM

## 2021-08-25 DIAGNOSIS — M810 Age-related osteoporosis without current pathological fracture: Secondary | ICD-10-CM | POA: Diagnosis not present

## 2021-08-25 DIAGNOSIS — R7989 Other specified abnormal findings of blood chemistry: Secondary | ICD-10-CM | POA: Diagnosis not present

## 2021-08-25 MED ORDER — IRBESARTAN 300 MG PO TABS: 300.0000 mg | ORAL_TABLET | Freq: Every day | ORAL | 1 refills | Status: DC

## 2021-08-25 MED ORDER — AMLODIPINE BESYLATE 10 MG PO TABS: 10.0000 mg | ORAL_TABLET | Freq: Every day | ORAL | 1 refills | Status: DC

## 2021-08-25 MED ORDER — HYDROCHLOROTHIAZIDE 12.5 MG PO CAPS: 12.5000 mg | ORAL_CAPSULE | Freq: Every day | ORAL | 0 refills | Status: DC

## 2021-08-25 NOTE — Progress Notes (Signed)
Virtual Visit Note Due to national recommendations of social distancing due to [place] 19, virtual visit is felt to be most appropriate for this patient at this time.  I discussed the limitations, risks, security and privacy concerns of performing an evaluation and management service by video and the availability of in person appointments. I also discussed with the patient that there may be a patient responsible charge related to this service. The patient expressed understanding and agreed to proceed.    I connected with Michelle Sosa on 08/25/21  at   2:10 PM EST  EDT by VIDEO and verified that I am speaking with the correct person using two identifiers.   Location of Patient: Private Residence   Location of Provider: [place] and [place] participating in VIRTUAL visit: [name] FNP-BC [place]    History of Present Illness: VIRTUAL visit for: HTN She has a past medical history of Hepatitis C, Hypertension, and Stroke ([place]).    Last reported home blood pressure reading 124/74. Doing well today but feels she may be coming down with "a little cold". Currently notes some sneezing but denies fever, sore throat or cough. Taking amlodipine 10 mg daily, HCTZ 12.5 mg daily and irbesartan 300 mg daily as prescribed.  BP Readings from Last 3 Encounters:  07/01/21 140/78  12/30/20 140/60  10/16/20 (!) 151/76      Past Medical History:  Diagnosis Date   Hepatitis C    Hypertension    Stroke [facility])     Past Surgical History:  Procedure Laterality Date   KNEE SURGERY  2005   LEG SURGERY     right leg, a rod.   MASTECTOMY Right    WRIST SURGERY  2008    Family History  Problem Relation Age of Onset   Heart disease Father    Hypothyroidism Sister    Breast cancer Neg Hx     Social History   Socioeconomic History   Marital status: Single    Spouse name: Not on file   Number of children: Not on file   Years of education: Not on  file   Highest education level: Not on file  Occupational History   Not on file  Tobacco Use   Smoking status: Former    Types: Cigarettes    Quit date: 02/14/2011    Years since quitting: 10.5   Smokeless tobacco: Never  Vaping Use   Vaping Use: Never used  Substance and Sexual Activity   Alcohol use: No   Drug use: No   Sexual activity: Not Currently  Other Topics Concern   Not on file  Social History Narrative   Not on file   Social Determinants of Health   Financial Resource Strain: Not on file  Food Insecurity: Not on file  Transportation Needs: Not on file  Physical Activity: Not on file  Stress: Not on file  Social Connections: Not on file     Observations/Objective: Awake, alert and oriented x 3   Review of Systems  Constitutional:  Negative for fever, malaise/fatigue and weight loss.  HENT: Negative.  Negative for nosebleeds.   Eyes: Negative.  Negative for blurred vision, double vision and photophobia.  Respiratory: Negative.  Negative for cough and shortness of breath.   Cardiovascular: Negative.  Negative for chest pain, palpitations and leg swelling.  Gastrointestinal: Negative.  Negative for heartburn, nausea and vomiting.  Musculoskeletal: Negative.  Negative for myalgias.  Neurological: Negative.  Negative  for dizziness, focal weakness, seizures and headaches.  Psychiatric/Behavioral: Negative.  Negative for suicidal ideas.    Assessment and Plan: Diagnoses and all orders for this visit:  Essential hypertension -     irbesartan (AVAPRO) 300 MG tablet; Take 1 tablet (300 mg total) by mouth daily. -     hydrochlorothiazide (MICROZIDE) 12.5 MG capsule; Take 1 capsule (12.5 mg total) by mouth daily. -     amLODipine (NORVASC) 10 MG tablet; Take 1 tablet (10 mg total) by mouth daily. -     CMP14+EGFR; Future  Colon cancer screening -     Fecal occult blood, imunochemical([facility]); Future  Positive hepatitis C antibody test -     HCV RNA  quant; Future  Abnormal CBC -     CBC; Future  Age-related osteoporosis without current pathological fracture -     VITAMIN D 25 Hydroxy (Vit-D Deficiency, Fractures); Future Currently taking OTC calcium-D supplement Declined Prolia and fosamax  Dyslipidemia -     Lipid panel; Future     Follow Up Instructions No follow-ups on file.     I discussed the assessment and treatment plan with the patient. The patient was provided an opportunity to ask questions and all were answered. The patient agreed with the plan and demonstrated an understanding of the instructions.   The patient was advised to call back or seek an in-person evaluation if the symptoms worsen or if the condition fails to improve as anticipated.  I provided 13 minutes of face-to-face time during this encounter including median intraservice time, reviewing previous notes, labs, imaging, medications and explaining diagnosis and management.  Gildardo Pounds, FNP-BC

## 2021-08-30 ENCOUNTER — Other Ambulatory Visit: Payer: Self-pay

## 2021-08-30 ENCOUNTER — Ambulatory Visit: Payer: PPO | Attending: Nurse Practitioner

## 2021-08-30 DIAGNOSIS — R7989 Other specified abnormal findings of blood chemistry: Secondary | ICD-10-CM

## 2021-08-30 DIAGNOSIS — I10 Essential (primary) hypertension: Secondary | ICD-10-CM

## 2021-08-30 DIAGNOSIS — M810 Age-related osteoporosis without current pathological fracture: Secondary | ICD-10-CM

## 2021-08-30 DIAGNOSIS — E785 Hyperlipidemia, unspecified: Secondary | ICD-10-CM | POA: Diagnosis not present

## 2021-08-30 DIAGNOSIS — R768 Other specified abnormal immunological findings in serum: Secondary | ICD-10-CM | POA: Diagnosis not present

## 2021-08-31 LAB — LIPID PANEL
Chol/HDL Ratio: 4.4 ratio (ref 0.0–4.4)
Cholesterol, Total: 218 mg/dL — ABNORMAL HIGH (ref 100–199)
HDL: 50 mg/dL (ref >39)
LDL Chol Calc (NIH): 143 mg/dL — ABNORMAL HIGH (ref 0–99)
Triglycerides: 140 mg/dL (ref 0–149)
VLDL Cholesterol Cal: 25 mg/dL (ref 5–40)

## 2021-08-31 LAB — CMP14+EGFR
ALT: 23 IU/L (ref 0–32)
AST: 26 IU/L (ref 0–40)
Albumin/Globulin Ratio: 1.4 (ref 1.2–2.2)
Albumin: 4.3 g/dL (ref 3.7–4.7)
Alkaline Phosphatase: 105 IU/L (ref 44–121)
BUN/Creatinine Ratio: 28 (ref 12–28)
BUN: 16 mg/dL (ref 8–27)
Bilirubin Total: 0.3 mg/dL (ref 0.0–1.2)
CO2: 24 mmol/L (ref 20–29)
Calcium: 10.3 mg/dL (ref 8.7–10.3)
Chloride: 100 mmol/L (ref 96–106)
Creatinine, Ser: 0.58 mg/dL (ref 0.57–1.00)
Globulin, Total: 3.1 g/dL (ref 1.5–4.5)
Glucose: 109 mg/dL — ABNORMAL HIGH (ref 70–99)
Potassium: 4.6 mmol/L (ref 3.5–5.2)
Sodium: 140 mmol/L (ref 134–144)
Total Protein: 7.4 g/dL (ref 6.0–8.5)
eGFR: 95 mL/min/{1.73_m2} (ref >59)

## 2021-08-31 LAB — HCV RNA QUANT
HCV log10: 5.806 log10 IU/mL
Hepatitis C Quantitation: 639000 IU/mL

## 2021-08-31 LAB — CBC
Hematocrit: 43.1 % (ref 34.0–46.6)
Hemoglobin: 15 g/dL (ref 11.1–15.9)
MCH: 32.1 pg (ref 26.6–33.0)
MCHC: 34.8 g/dL (ref 31.5–35.7)
MCV: 92 fL (ref 79–97)
Platelets: 362 10*3/uL (ref 150–450)
RBC: 4.67 x10E6/uL (ref 3.77–5.28)
RDW: 11.3 % — ABNORMAL LOW (ref 11.7–15.4)
WBC: 10.1 10*3/uL (ref 3.4–10.8)

## 2021-08-31 LAB — VITAMIN D 25 HYDROXY (VIT D DEFICIENCY, FRACTURES): Vit D, 25-Hydroxy: 53.8 ng/mL (ref 30.0–100.0)

## 2021-09-07 ENCOUNTER — Other Ambulatory Visit: Payer: Self-pay | Admitting: Nurse Practitioner

## 2021-09-07 DIAGNOSIS — E782 Mixed hyperlipidemia: Secondary | ICD-10-CM

## 2021-09-07 MED ORDER — ROSUVASTATIN CALCIUM 5 MG PO TABS
5.0000 mg | ORAL_TABLET | Freq: Every day | ORAL | 3 refills | Status: DC
Start: 2021-09-07 — End: 2022-01-28

## 2021-10-27 ENCOUNTER — Other Ambulatory Visit: Payer: Self-pay

## 2021-10-27 ENCOUNTER — Encounter: Payer: Self-pay | Admitting: Nurse Practitioner

## 2021-10-27 ENCOUNTER — Ambulatory Visit: Payer: PPO | Attending: Nurse Practitioner | Admitting: Nurse Practitioner

## 2021-10-27 VITALS — BP 149/82 | HR 68 | Resp 16 | Ht 63.0 in | Wt 141.6 lb

## 2021-10-27 DIAGNOSIS — Z1211 Encounter for screening for malignant neoplasm of colon: Secondary | ICD-10-CM

## 2021-10-27 DIAGNOSIS — M810 Age-related osteoporosis without current pathological fracture: Secondary | ICD-10-CM | POA: Diagnosis not present

## 2021-10-27 DIAGNOSIS — Z0000 Encounter for general adult medical examination without abnormal findings: Secondary | ICD-10-CM | POA: Diagnosis not present

## 2021-10-27 DIAGNOSIS — Z1231 Encounter for screening mammogram for malignant neoplasm of breast: Secondary | ICD-10-CM | POA: Diagnosis not present

## 2021-10-27 DIAGNOSIS — Z23 Encounter for immunization: Secondary | ICD-10-CM | POA: Diagnosis not present

## 2021-10-27 NOTE — Progress Notes (Signed)
? ?Subjective:  ? Michelle Sosa is a 76 y.o. female who presents for an Initial Medicare Annual Wellness Visit. ? ?Review of Systems  ?Constitutional:  Negative for fever, malaise/fatigue and weight loss.  ?HENT: Negative.  Negative for nosebleeds.   ?Eyes: Negative.  Negative for blurred vision, double vision and photophobia.  ?Respiratory: Negative.  Negative for cough and shortness of breath.   ?Cardiovascular: Negative.  Negative for chest pain, palpitations and leg swelling.  ?Gastrointestinal: Negative.  Negative for heartburn, nausea and vomiting.  ?Musculoskeletal: Negative.  Negative for myalgias.  ?Neurological: Negative.  Negative for dizziness, focal weakness, seizures and headaches.  ?Psychiatric/Behavioral: Negative.  Negative for suicidal ideas.    ? ?  ? ?   ?Objective:  ?  ?Today's Vitals  ? 10/27/21 1011  ?BP: (!) 149/82  ?Pulse: 68  ?Resp: 16  ?SpO2: 99%  ?Weight: 141 lb 9.6 oz (64.2 kg)  ?Height: '5\' 3"'$  (1.6 m)  ? ?Body mass index is 25.08 kg/m?. ? ?Advanced Directives 10/27/2021 04/24/2017 01/18/2017 07/14/2016 01/07/2016 04/24/2015  ?Does Patient Have a Medical Advance Directive? No No No No No No  ?Would patient like information on creating a medical advance directive? No - Patient declined No - Patient declined - - No - patient declined information No - patient declined information  ? ? ?Current Medications (verified) ?Outpatient Encounter Medications as of 10/27/2021  ?Medication Sig  ? amLODipine (NORVASC) 10 MG tablet Take 1 tablet (10 mg total) by mouth daily.  ? ASPIRIN 81 PO Take 1 tablet by mouth daily.   ? Calcium Carbonate-Vitamin D3 600-400 MG-UNIT TABS Take 1 tablet by mouth 2 (two) times daily.   ? hydrochlorothiazide (MICROZIDE) 12.5 MG capsule Take 1 capsule (12.5 mg total) by mouth daily.  ? irbesartan (AVAPRO) 300 MG tablet Take 1 tablet (300 mg total) by mouth daily.  ? Multiple Vitamins-Minerals (CENTRUM SILVER 50+WOMEN PO) Take 1 tablet by mouth daily.   ? Omega-3 Fatty Acids  (OMEGA-3 FISH OIL PO) Take 2 tablets by mouth daily.  ? thiamine (VITAMIN B-1) 100 MG tablet Take 100 mg by mouth daily.  ? vitamin B-12 (CYANOCOBALAMIN) 100 MCG tablet Take 100 mcg by mouth daily.  ? FLUZONE HIGH-DOSE QUADRIVALENT 0.7 ML SUSY  (Patient not taking: Reported on 10/27/2021)  ? rosuvastatin (CRESTOR) 5 MG tablet Take 1 tablet (5 mg total) by mouth daily. (Patient not taking: Reported on 10/27/2021)  ? ?No facility-administered encounter medications on file as of 10/27/2021.  ? ? ?Allergies (verified) ?Metoprolol tartrate, Cipro [ciprofloxacin hcl], Codeine, Penicillins, and Sulfa antibiotics  ? ?History: ?Past Medical History:  ?Diagnosis Date  ? Hepatitis C   ? Hypertension   ? Stroke [facility])   ? ?Past Surgical History:  ?Procedure Laterality Date  ? KNEE SURGERY  2005  ? LEG SURGERY    ? right leg, a rod.  ? MASTECTOMY Right   ? WRIST SURGERY  2008  ? ?Family History  ?Problem Relation Age of Onset  ? Heart disease Father   ? Hypothyroidism Sister   ? Breast cancer Neg Hx   ? ?Social History  ? ?Socioeconomic History  ? Marital status: Single  ?  Spouse name: Not on file  ? Number of children: Not on file  ? Years of education: Not on file  ? Highest education level: Not on file  ?Occupational History  ? Not on file  ?Tobacco Use  ? Smoking status: Former  ?  Types: Cigarettes  ?  Quit date:  02/14/2011  ?  Years since quitting: 10.7  ? Smokeless tobacco: Never  ?Vaping Use  ? Vaping Use: Never used  ?Substance and Sexual Activity  ? Alcohol use: No  ? Drug use: No  ? Sexual activity: Not Currently  ?Other Topics Concern  ? Not on file  ?Social History Narrative  ? Not on file  ? ?Social Determinants of Health  ? ?Financial Resource Strain: Not on file  ?Food Insecurity: Not on file  ?Transportation Needs: Not on file  ?Physical Activity: Not on file  ?Stress: Not on file  ?Social Connections: Not on file  ? ? ?Tobacco Counseling ?Counseling given: Not Answered ? ? ?Clinical Intake: ? ?  ? ?Pain : No/denies  pain ? ?  ? ?Diabetes: No ? ?How often do you need to have someone help you when you read instructions, pamphlets, or other written materials from your doctor or pharmacy?: (P) 1 - Never ? ?Diabetic? No ? ?  ? ?  ? ? ?Activities of Daily Living ?In your present state of health, do you have any difficulty performing the following activities: 10/27/2021 10/22/2021  ?Hearing? N N  ?Vision? N N  ?Difficulty concentrating or making decisions? N N  ?Walking or climbing stairs? Y Y  ?Comment pt uses a cane -  ?Dressing or bathing? N N  ?Doing errands, shopping? N N  ?Preparing Food and eating ? N N  ?Using the Toilet? N N  ?In the past six months, have you accidently leaked urine? N N  ?Do you have problems with loss of bowel control? N N  ?Managing your Medications? N N  ?Managing your Finances? N N  ?Housekeeping or managing your Housekeeping? N N  ?Some recent data might be hidden  ? ? ?Patient Care Team: ?Gildardo Pounds, NP as PCP - General (Nurse Practitioner) ? ?Indicate any recent Medical Services you may have received from other than [facility] providers in the past year (date may be approximate). ? ?   ?Assessment:  ? This is a routine wellness examination for [place]. ? ?Hearing/Vision screen ?No results found. ? ?Dietary issues and exercise activities discussed: ?  ? ? Goals Addressed   ?None ?  ?Depression Screen ?PHQ 2/9 Scores 10/27/2021 09/25/2020 09/23/2019 01/16/2018 04/24/2017 01/18/2017 07/14/2016  ?PHQ - 2 Score 0 0 0 0 0 0 0  ?PHQ- 9 Score - 0 '1 1 1 2 3  '$ ?  ?Fall Risk ?Fall Risk  10/27/2021 10/22/2021 09/25/2020 06/26/2020 09/23/2019  ?Falls in the past year? 0 0 0 0 1  ?Number falls in past yr: 0 - 0 - 0  ?Injury with Fall? 0 - 0 - 0  ?Risk for fall due to : No Fall Risks - No Fall Risks - -  ?Follow up - - Falls evaluation completed - -  ? ? ?FALL RISK PREVENTION PERTAINING TO THE HOME: ? ?Any stairs in or around the home? Yes  ?If so, are there any without handrails? Yes  ?Home free of loose throw rugs in walkways, pet  beds, electrical cords, etc? No  ?Adequate lighting in your home to reduce risk of falls? Yes  ? ?ASSISTIVE DEVICES UTILIZED TO PREVENT FALLS: ? ?Life alert? No  ?Use of a cane, walker or w/c? Yes  ?Grab bars in the bathroom? Yes  ?Shower chair or bench in shower? Yes   ?Elevated toilet seat or a handicapped toilet? Yes  ? ?TIMED UP AND GO: ? ?Was the test performed? Yes .  ?  Length of time to ambulate 10 feet: less than 10 sec.  ? ?Gait steady and fast with assistive device ? ?Cognitive Function: ?MMSE - Mini Mental State Exam 10/27/2021  ?Orientation to time 5  ?Orientation to Place 5  ?Registration 3  ?Attention/ Calculation 5  ?Recall 3  ?Language- name 2 objects 2  ?Language- repeat 1  ?Language- follow 3 step command 3  ?Language- read & follow direction 1  ?Write a sentence 1  ?Copy design 1  ?Total score 30  ? ?  ?  ? ?Immunizations ?Immunization History  ?Administered Date(s) Administered  ? Influenza,inj,Quad PF,6+ Mos 04/24/2017  ? PFIZER(Purple Top)SARS-COV-2 Vaccination 09/23/2019, 10/18/2019, 05/30/2020  ? Pneumococcal Conjugate-13 06/20/2013  ? Pneumococcal Polysaccharide-23 05/08/2015  ? ? ?TDAP status: Completed at today's visit ? ?Flu Vaccine status: Up to date ? ?Pneumococcal vaccine status: Up to date ? ?Covid-19 vaccine status: Completed vaccines. Pt has received 3 vaccines. Pt is wanting to discuss about getting the last Covid booster ? ?Qualifies for Shingles Vaccine? Yes   ?Zostavax completed No   ?Shingrix Completed?: No.    Education has been provided regarding the importance of this vaccine. Patient has been advised to call insurance company to determine out of pocket expense if they have not yet received this vaccine. Advised may also receive vaccine at local pharmacy or Health Dept. Verbalized acceptance and understanding. Pt would like to discuss the shingles vaccine with provider  ?Screening Tests ?Health Maintenance  ?Topic Date Due  ? TETANUS/TDAP  Never done  ? Zoster Vaccines-  Shingrix (1 of 2) Never done  ? COVID-19 Vaccine (4 - Booster for [place] series) 07/25/2020  ? COLON CANCER SCREENING ANNUAL FOBT  04/10/2021  ? MAMMOGRAM  03/04/2023  ? Pneumonia Vaccine 49+ Years old  Compl

## 2021-10-27 NOTE — Patient Instructions (Signed)
Colonoscopy, Adult ?A colonoscopy is a procedure to look at the entire large intestine. This procedure is done using a long, thin, flexible tube that has a camera on the end. ?You may have a colonoscopy: ?As a part of normal colorectal screening. ?If you have certain symptoms, such as: ?A low number of red blood cells in your blood (anemia). ?Diarrhea that does not go away. ?Pain in your abdomen. ?Blood in your stool. ?A colonoscopy can help screen for and diagnose medical problems, including: ?Tumors. ?Extra tissue that grows where mucus forms (polyps). ?Inflammation. ?Areas of bleeding. ?Tell your health care provider about: ?Any allergies you have. ?All medicines you are taking, including vitamins, herbs, eye drops, creams, and over-the-counter medicines. ?Any problems you or family members have had with anesthetic medicines. ?Any blood disorders you have. ?Any surgeries you have had. ?Any medical conditions you have. ?Any problems you have had with having bowel movements. ?Whether you are pregnant or may be pregnant. ?What are the risks? ?Generally, this is a safe procedure. However, problems may occur, including: ?Bleeding. ?Damage to your intestine. ?Allergic reactions to medicines given during the procedure. ?Infection. This is rare. ?What happens before the procedure? ?Eating and drinking restrictions ?Follow instructions from your health care provider about eating or drinking restrictions, which may include: ?A few days before the procedure: ?Follow a low-fiber diet. ?Avoid nuts, seeds, dried fruit, raw fruits, and vegetables. ?1-3 days before the procedure: ?Eat only gelatin dessert or ice pops. ?Drink only clear liquids, such as water, clear juice, clear broth or bouillon, black coffee or tea, or clear soft drinks or sports drinks. ?Avoid liquids that contain red or purple dye. ?The day of the procedure: ?Do not eat solid foods. You may continue to drink clear liquids until up to 2 hours before the  procedure. ?Do not eat or drink anything starting 2 hours before the procedure, or within the time period that your health care provider recommends. ?Bowel prep ?If you were prescribed a bowel prep to take by mouth (orally) to clean out your colon: ?Take it as told by your health care provider. Starting the day before your procedure, you will need to drink a large amount of liquid medicine. The liquid will cause you to have many bowel movements of loose stool until your stool becomes almost clear or light green. ?If your skin or the opening between the buttocks (anus) gets irritated from diarrhea, you may relieve the irritation using: ?Wipes with medicine in them, such as adult wet wipes with aloe and vitamin E. ?A product to soothe skin, such as petroleum jelly. ?If you vomit while drinking the bowel prep: ?Take a break for up to 60 minutes. ?Begin the bowel prep again. ?Call your health care provider if you keep vomiting or you cannot take the bowel prep without vomiting. ?To clean out your colon, you may also be given: ?Laxative medicines. These help you have a bowel movement. ?Instructions for enema use. An enema is liquid medicine injected into your rectum. ?Medicines ?Ask your health care provider about: ?Changing or stopping your regular medicines or supplements. This is especially important if you are taking iron supplements, diabetes medicines, or blood thinners. ?Taking medicines such as aspirin and ibuprofen. These medicines can thin your blood. Do not take these medicines unless your health care provider tells you to take them. ?Taking over-the-counter medicines, vitamins, herbs, and supplements. ?General instructions ?Ask your health care provider what steps will be taken to help prevent infection. These may include  washing skin with a germ-killing soap. ?Plan to have someone take you home from the hospital or clinic. ?What happens during the procedure? ? ?An IV will be inserted into one of your  veins. ?You may be given one or more of the following: ?A medicine to help you relax (sedative). ?A medicine to numb the area (local anesthetic). ?A medicine to make you fall asleep (general anesthetic). This is rarely needed. ?You will lie on your side with your knees bent. ?The tube will: ?Have oil or gel put on it (be lubricated). ?Be inserted into your anus. ?Be gently eased through all parts of your large intestine. ?Air will be sent into your colon to keep it open. This may cause some pressure or cramping. ?Images will be taken with the camera and will appear on a screen. ?A small tissue sample may be removed to be looked at under a microscope (biopsy). The tissue may be sent to a lab for testing if any signs of problems are found. ?If small polyps are found, they may be removed and checked for cancer cells. ?When the procedure is finished, the tube will be removed. ?The procedure may vary among health care providers and hospitals. ?What happens after the procedure? ?Your blood pressure, heart rate, breathing rate, and blood oxygen level will be monitored until you leave the hospital or clinic. ?You may have a small amount of blood in your stool. ?You may pass gas and have mild cramping or bloating in your abdomen. This is caused by the air that was used to open your colon during the exam. ?Do not drive for 24 hours after the procedure. ?It is up to you to get the results of your procedure. Ask your health care provider, or the department that is doing the procedure, when your results will be ready. ?Summary ?A colonoscopy is a procedure to look at the entire large intestine. ?Follow instructions from your health care provider about eating and drinking before the procedure. ?If you were prescribed an oral bowel prep to clean out your colon, take it as told by your health care provider. ?During the colonoscopy, a flexible tube with a camera on its end is inserted into the anus and then passed into the other  parts of the large intestine. ?This information is not intended to replace advice given to you by your health care provider. Make sure you discuss any questions you have with your health care provider. ?Document Revised: 02/22/2019 Document Reviewed: 02/22/2019 ?Elsevier Patient Education ? [place]. ? ?Fat and Cholesterol Restricted Eating Plan ?Getting too much fat and cholesterol in your diet may cause health problems. Choosing the right foods helps keep your fat and cholesterol at normal levels. This can keep you from getting certain diseases. ?Your doctor may recommend an eating plan that includes: ?Total fat: ______% or less of total calories a day. This is ______g of fat a day. ?Saturated fat: ______% or less of total calories a day. This is ______g of saturated fat a day. ?Cholesterol: less than _________mg a day. ?Fiber: ______g a day. ?What are tips for following this plan? ?General tips ?Work with your doctor to lose weight if you need to. ?Avoid: ?Foods with added sugar. ?Fried foods. ?Foods with trans fat or partially hydrogenated oils. This includes some margarines and baked goods. ?If you drink alcohol: ?Limit how much you have to: ?0-1 drink a day for women who are not pregnant. ?0-2 drinks a day for men. ?Know how much alcohol  is in a drink. In the [place] one drink equals one 12 oz bottle of beer (355 mL), one 5 oz glass of wine (148 mL), or one 1? oz glass of hard liquor (44 mL). ?Reading food labels ?Check food labels for: ?Trans fats. ?Partially hydrogenated oils. ?Saturated fat (g) in each serving. ?Cholesterol (mg) in each serving. ?Fiber (g) in each serving. ?Choose foods with healthy fats, such as: ?Monounsaturated fats and polyunsaturated fats. These include olive and canola oil, flaxseeds, walnuts, almonds, and seeds. ?Omega-3 fats. These are found in certain fish, flaxseed oil, and ground flaxseeds. ?Choose grain products that have whole grains. Look for the word "whole" as the  first word in the ingredient list. ?Cooking ?Cook foods using low-fat methods. These include baking, boiling, grilling, and broiling. ?Eat more home-cooked foods. Eat at restaurants and buffets less often. Eat less fast

## 2021-10-28 ENCOUNTER — Encounter: Payer: Self-pay | Admitting: Nurse Practitioner

## 2021-12-29 ENCOUNTER — Ambulatory Visit: Payer: PPO | Admitting: Endocrinology

## 2021-12-29 ENCOUNTER — Other Ambulatory Visit: Payer: Self-pay | Admitting: Nurse Practitioner

## 2021-12-29 DIAGNOSIS — I10 Essential (primary) hypertension: Secondary | ICD-10-CM

## 2022-01-25 DIAGNOSIS — Z1211 Encounter for screening for malignant neoplasm of colon: Secondary | ICD-10-CM | POA: Diagnosis not present

## 2022-01-27 LAB — FECAL OCCULT BLOOD, IMMUNOCHEMICAL: Fecal Occult Bld: POSITIVE — AB

## 2022-01-28 ENCOUNTER — Encounter: Payer: Self-pay | Admitting: Nurse Practitioner

## 2022-01-28 ENCOUNTER — Ambulatory Visit: Payer: PPO | Attending: Nurse Practitioner | Admitting: Nurse Practitioner

## 2022-01-28 VITALS — BP 150/80 | HR 75 | Wt 138.8 lb

## 2022-01-28 DIAGNOSIS — R195 Other fecal abnormalities: Secondary | ICD-10-CM | POA: Diagnosis not present

## 2022-01-28 DIAGNOSIS — E7800 Pure hypercholesterolemia, unspecified: Secondary | ICD-10-CM | POA: Diagnosis not present

## 2022-01-28 DIAGNOSIS — I10 Essential (primary) hypertension: Secondary | ICD-10-CM | POA: Diagnosis not present

## 2022-01-28 MED ORDER — HYDROCHLOROTHIAZIDE 12.5 MG PO CAPS: 12.5000 mg | ORAL_CAPSULE | Freq: Every day | ORAL | 1 refills | Status: DC

## 2022-01-28 MED ORDER — AMLODIPINE BESYLATE 10 MG PO TABS: 10.0000 mg | ORAL_TABLET | Freq: Every day | ORAL | 1 refills | Status: DC

## 2022-01-28 MED ORDER — IRBESARTAN 300 MG PO TABS: 300.0000 mg | ORAL_TABLET | Freq: Every day | ORAL | 1 refills | Status: DC

## 2022-01-28 NOTE — Progress Notes (Signed)
Assessment & Plan:  Michelle Sosa was seen today for hypertension and medication refill.  Diagnoses and all orders for this visit:  Essential hypertension -     CMP14+EGFR -     amLODipine (NORVASC) 10 MG tablet; Take 1 tablet (10 mg total) by mouth daily. Please fill as a 90 day supply -     irbesartan (AVAPRO) 300 MG tablet; Take 1 tablet (300 mg total) by mouth daily. Please fill as a 90 day supply -     hydrochlorothiazide (MICROZIDE) 12.5 MG capsule; Take 1 capsule (12.5 mg total) by mouth daily. Please fill as a 90 day supply Continue all antihypertensives as prescribed.  Remember to bring in your blood pressure log with you for your follow up appointment.  DASH/Mediterranean Diets are healthier choices for HTN.  He  Positive fecal immunochemical test She will notify me via MyChart in 2 weeks if she would like Cologuard sent to her home or proceed with referral to gastroenterology.   Hypercholesteremia -     Lipid panel    Patient has been counseled on age-appropriate routine health concerns for screening and prevention. These are reviewed and up-to-date. Referrals have been placed accordingly. Immunizations are up-to-date or declined.    Subjective:   Chief Complaint  Patient presents with   Hypertension   Medication Refill   HPI Michelle Sosa 76 y.o. female presents to office today for follow-up to hypertension.  She has a past medical history of Hepatitis C, Hypertension, and Stroke (mild residual weakness and uses a cane to assist with mobility).    Patient has been counseled on age-appropriate routine health concerns for screening and prevention. These are reviewed and up-to-date. Referrals have been placed accordingly. Immunizations are up-to-date or declined.    MAMMOGRAM: Up-to-date.  Scheduled for 04/13/2022 along with bone density screening COLON CANCER SCREEN: positive fit test.  She wants to hold off on further evaluation at this time and will let me know in 2  weeks if she would like to proceed with Cologuard or referral to gastroenterology Tetanus: Michelle Sosa states she received her tetanus vaccine in March however unfortunately this was not documented by the certified medical assistant at that time Shingles: Patient declines    HTN Average blood pressure readings at home 125-130/70s.  She has a blood pressure log here with her today.  At this time we will not make any changes with her blood pressure medications and she will continue on amlodipine 10 mg daily, hydrochlorothiazide 12.5 mg daily and irbesartan 300 mg daily. BP Readings from Last 3 Encounters:  01/28/22 (!) 150/80  10/27/21 (!) 149/82  07/01/21 140/78    Hypercholesterolemia She is reluctant to take rosuvastatin and has declined this.  Currently taking omega-3 fish oil 2 tablets daily. Review of Systems  Constitutional:  Negative for fever, malaise/fatigue and weight loss.  HENT: Negative.  Negative for nosebleeds.   Eyes: Negative.  Negative for blurred vision, double vision and photophobia.  Respiratory: Negative.  Negative for cough and shortness of breath.   Cardiovascular: Negative.  Negative for chest pain, palpitations and leg swelling.  Gastrointestinal: Negative.  Negative for heartburn, nausea and vomiting.  Musculoskeletal: Negative.  Negative for myalgias.  Neurological: Negative.  Negative for dizziness, focal weakness, seizures and headaches.  Psychiatric/Behavioral: Negative.  Negative for suicidal ideas.     Past Medical History:  Diagnosis Date   Hepatitis C    Hypertension    Stroke [facility])     Past Surgical  History:  Procedure Laterality Date   KNEE SURGERY  2005   LEG SURGERY     right leg, a rod.   MASTECTOMY Right    WRIST SURGERY  2008    Family History  Problem Relation Age of Onset   Heart disease Father    Hypothyroidism Sister    Breast cancer Neg Hx     Social History Reviewed with no changes to be made today.   Outpatient  Medications Prior to Visit  Medication Sig Dispense Refill   ASPIRIN 81 PO Take 1 tablet by mouth daily.      Calcium Carbonate-Vitamin D3 600-400 MG-UNIT TABS Take 1 tablet by mouth 2 (two) times daily.      Multiple Vitamins-Minerals (CENTRUM SILVER 50+WOMEN PO) Take 1 tablet by mouth daily.      Omega-3 Fatty Acids (OMEGA-3 FISH OIL PO) Take 2 tablets by mouth daily.     thiamine (VITAMIN B-1) 100 MG tablet Take 100 mg by mouth daily.     vitamin B-12 (CYANOCOBALAMIN) 100 MCG tablet Take 100 mcg by mouth daily.     amLODipine (NORVASC) 10 MG tablet Take 1 tablet (10 mg total) by mouth daily. 90 tablet 1   FLUZONE HIGH-DOSE QUADRIVALENT 0.7 ML SUSY  (Patient not taking: Reported on 10/27/2021)     hydrochlorothiazide (MICROZIDE) 12.5 MG capsule TAKE 1 CAPSULE(12.5 MG) BY MOUTH DAILY 30 capsule 0   irbesartan (AVAPRO) 300 MG tablet Take 1 tablet (300 mg total) by mouth daily. 90 tablet 1   rosuvastatin (CRESTOR) 5 MG tablet Take 1 tablet (5 mg total) by mouth daily. (Patient not taking: Reported on 10/27/2021) 90 tablet 3   No facility-administered medications prior to visit.    Allergies  Allergen Reactions   Metoprolol Tartrate Other (See Comments)    Affects her mood/ lability   Cipro [Ciprofloxacin Hcl]     Joint and muscle pain   Codeine Other (See Comments)   Penicillins Other (See Comments)   Sulfa Antibiotics     States she can take Bactrim       Objective:    BP (!) 150/80   Pulse 75   Wt 138 lb 12.8 oz (63 kg)   SpO2 98%   BMI 24.59 kg/m  Wt Readings from Last 3 Encounters:  01/28/22 138 lb 12.8 oz (63 kg)  10/27/21 141 lb 9.6 oz (64.2 kg)  07/01/21 147 lb 3.2 oz (66.8 kg)    Physical Exam Vitals and nursing note reviewed.  Constitutional:      Appearance: She is well-developed.  HENT:     Head: Normocephalic and atraumatic.  Cardiovascular:     Rate and Rhythm: Normal rate and regular rhythm.     Heart sounds: Normal heart sounds. No murmur heard.    No  friction rub. No gallop.  Pulmonary:     Effort: Pulmonary effort is normal. No tachypnea or respiratory distress.     Breath sounds: Normal breath sounds. No decreased breath sounds, wheezing, rhonchi or rales.  Chest:     Chest wall: No tenderness.  Abdominal:     General: Bowel sounds are normal.     Palpations: Abdomen is soft.  Musculoskeletal:        General: Normal range of motion.     Cervical back: Normal range of motion.  Skin:    General: Skin is warm and dry.  Neurological:     Mental Status: She is alert and oriented to person, place, and time.  Coordination: Coordination normal.  Psychiatric:        Behavior: Behavior normal. Behavior is cooperative.        Thought Content: Thought content normal.        Judgment: Judgment normal.          Patient has been counseled extensively about nutrition and exercise as well as the importance of adherence with medications and regular follow-up. The patient was given clear instructions to go to ER or return to medical center if symptoms don't improve, worsen or new problems develop. The patient verbalized understanding.   Follow-up: Return in about 3 months (around 04/30/2022).   Gildardo Pounds, FNP-BC [facility] and [facility] [place], [place]   01/28/2022, 9:40 AM

## 2022-01-29 LAB — CMP14+EGFR
ALT: 23 IU/L (ref 0–32)
AST: 24 IU/L (ref 0–40)
Albumin/Globulin Ratio: 1.4 (ref 1.2–2.2)
Albumin: 4.5 g/dL (ref 3.7–4.7)
Alkaline Phosphatase: 83 IU/L (ref 44–121)
BUN/Creatinine Ratio: 24 (ref 12–28)
BUN: 15 mg/dL (ref 8–27)
Bilirubin Total: 0.5 mg/dL (ref 0.0–1.2)
CO2: 24 mmol/L (ref 20–29)
Calcium: 10.5 mg/dL — ABNORMAL HIGH (ref 8.7–10.3)
Chloride: 99 mmol/L (ref 96–106)
Creatinine, Ser: 0.62 mg/dL (ref 0.57–1.00)
Globulin, Total: 3.3 g/dL (ref 1.5–4.5)
Glucose: 102 mg/dL — ABNORMAL HIGH (ref 70–99)
Potassium: 4 mmol/L (ref 3.5–5.2)
Sodium: 138 mmol/L (ref 134–144)
Total Protein: 7.8 g/dL (ref 6.0–8.5)
eGFR: 94 mL/min/{1.73_m2} (ref >59)

## 2022-01-29 LAB — LIPID PANEL
Chol/HDL Ratio: 3.3 ratio (ref 0.0–4.4)
Cholesterol, Total: 207 mg/dL — ABNORMAL HIGH (ref 100–199)
HDL: 62 mg/dL (ref >39)
LDL Chol Calc (NIH): 126 mg/dL — ABNORMAL HIGH (ref 0–99)
Triglycerides: 108 mg/dL (ref 0–149)
VLDL Cholesterol Cal: 19 mg/dL (ref 5–40)

## 2022-01-31 ENCOUNTER — Encounter: Payer: Self-pay | Admitting: Nurse Practitioner

## 2022-04-13 ENCOUNTER — Ambulatory Visit
Admission: RE | Admit: 2022-04-13 | Discharge: 2022-04-13 | Disposition: A | Discharge status: Home / Self Care | Payer: PPO | Source: Ambulatory Visit | Attending: Nurse Practitioner | Admitting: Nurse Practitioner

## 2022-04-13 DIAGNOSIS — Z780 Asymptomatic menopausal state: Secondary | ICD-10-CM | POA: Diagnosis not present

## 2022-04-13 DIAGNOSIS — Z1231 Encounter for screening mammogram for malignant neoplasm of breast: Secondary | ICD-10-CM

## 2022-04-13 DIAGNOSIS — M810 Age-related osteoporosis without current pathological fracture: Secondary | ICD-10-CM

## 2022-04-13 HISTORY — DX: Personal history of antineoplastic chemotherapy: Z92.21

## 2022-04-25 IMAGING — MG DIGITAL SCREENING UNILAT LEFT W/ TOMO W/ CAD
4 series · 4 of 12 positions shown · non-contrast
Comparison: Previous exam(s).

CLINICAL DATA: Screening.

EXAM:
DIGITAL SCREENING UNILATERAL LEFT MAMMOGRAM WITH CAD AND
TOMOSYNTHESIS
TECHNIQUE: Left screening digital craniocaudal and mediolateral oblique
mammograms were obtained. Left screening digital breast
tomosynthesis was performed. The images were evaluated with
computer-aided detection.

[L MLO synth-2D]
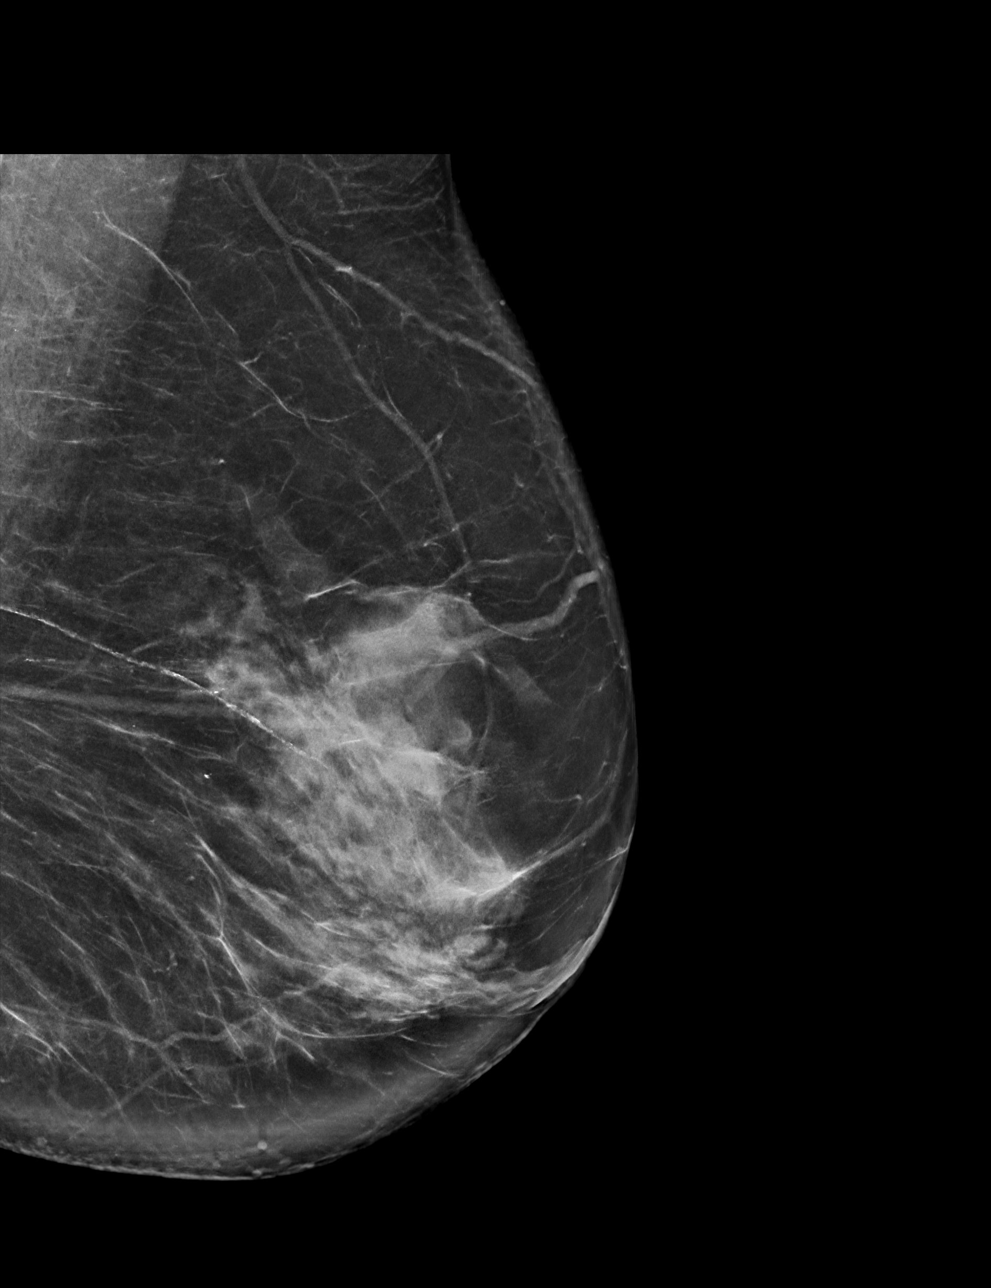

[L CC synth-2D]
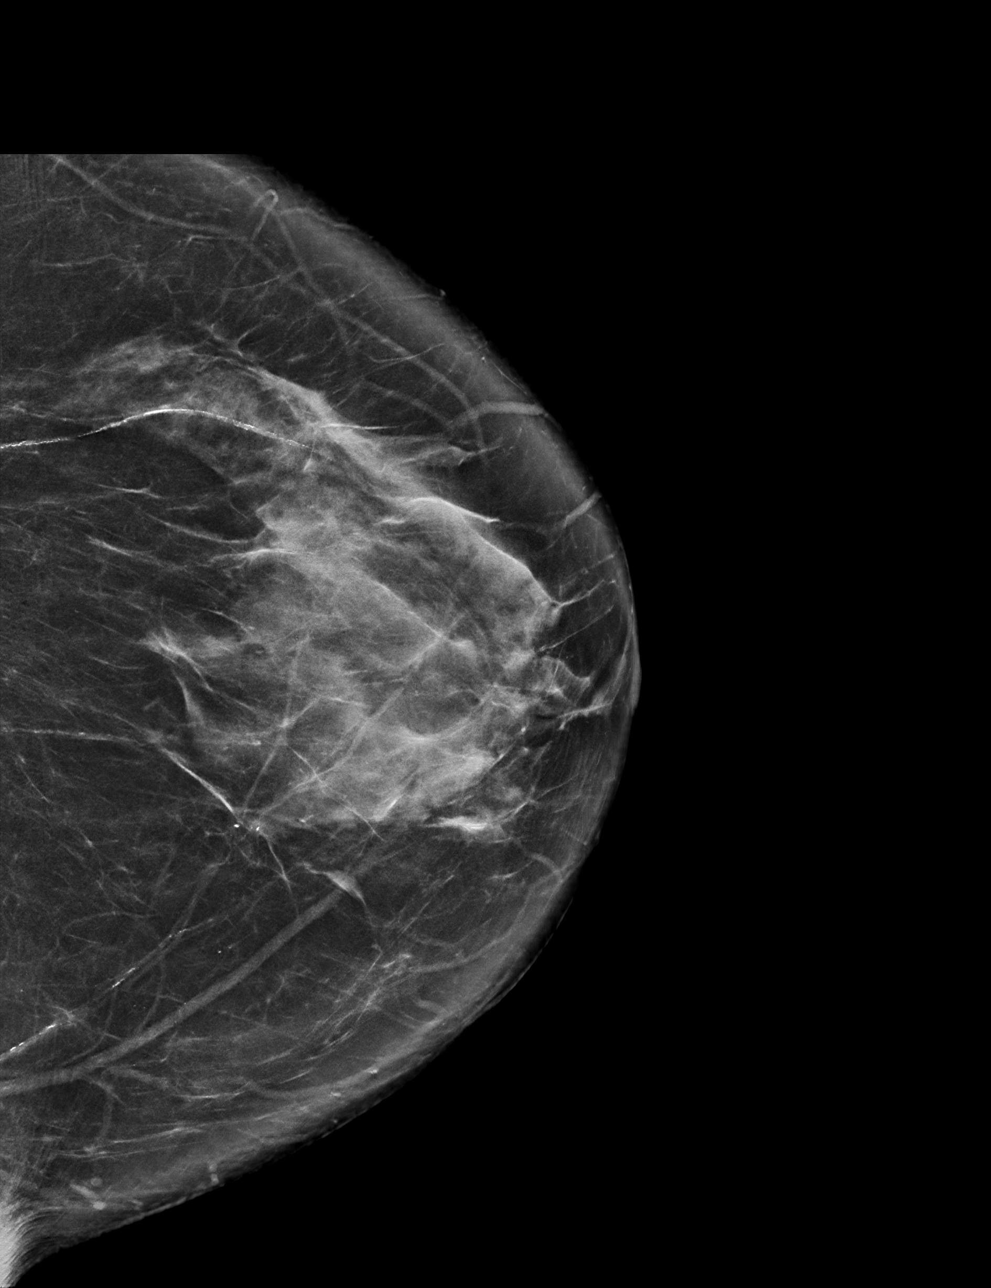

[L CC tomo · tomo slice 39/77.0]
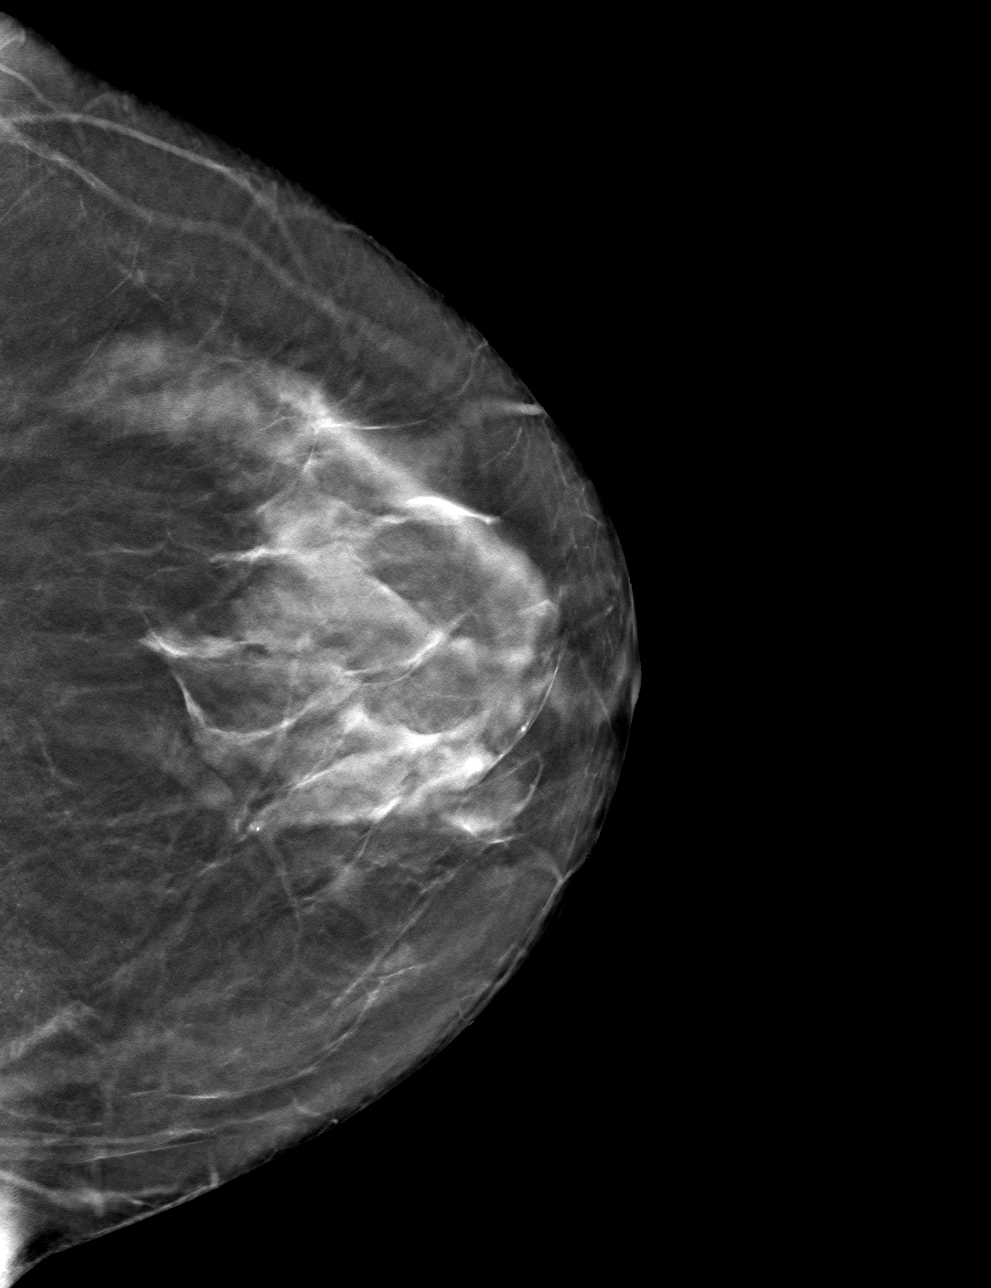

[L MLO tomo · tomo slice 41/82.0]
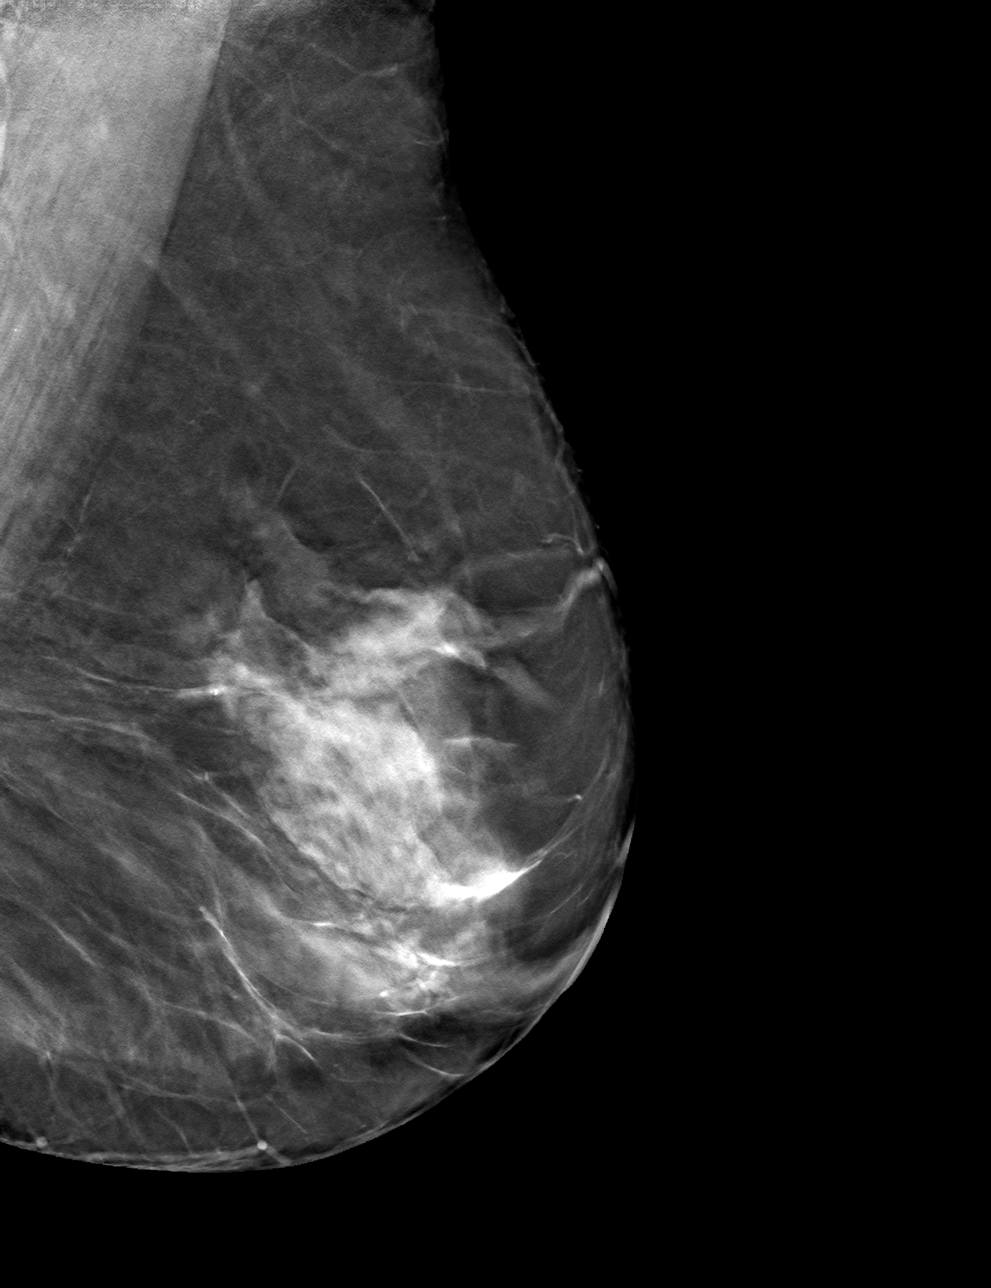

[4 of 12 positions shown; findings below may reference images not displayed]

ACR Breast Density Category c: The breast tissue is heterogeneously
dense, which may obscure small masses.
FINDINGS: The patient has had a right mastectomy. There are no findings
suspicious for malignancy.
IMPRESSION: No mammographic evidence of malignancy. A result letter of this
screening mammogram will be mailed directly to the patient.

RECOMMENDATION:
Screening mammogram in one year.  (Code:HB-T-2WB)

BI-RADS CATEGORY  1: Negative.

## 2022-05-04 ENCOUNTER — Ambulatory Visit: Payer: PPO | Admitting: Nurse Practitioner

## 2022-05-05 ENCOUNTER — Ambulatory Visit: Payer: PPO | Admitting: Internal Medicine

## 2022-05-18 ENCOUNTER — Ambulatory Visit: Payer: PPO | Admitting: Internal Medicine

## 2022-05-18 ENCOUNTER — Encounter: Payer: Self-pay | Admitting: Internal Medicine

## 2022-05-18 VITALS — BP 146/62 | HR 70 | Ht 63.0 in | Wt 140.8 lb

## 2022-05-18 DIAGNOSIS — E039 Hypothyroidism, unspecified: Secondary | ICD-10-CM

## 2022-05-18 DIAGNOSIS — Z23 Encounter for immunization: Secondary | ICD-10-CM

## 2022-05-18 LAB — T4, FREE: Free T4: 0.96 ng/dL (ref 0.60–1.60)

## 2022-05-18 LAB — TSH: TSH: 5.25 u[IU]/mL (ref 0.35–5.50)

## 2022-05-18 LAB — T3, FREE: T3, Free: 3.4 pg/mL (ref 2.3–4.2)

## 2022-05-18 NOTE — Progress Notes (Unsigned)
Patient ID: Michelle Sosa, female   DOB: Jul 10, 1946, 76 y.o.   MRN: [id number]  HPI  Michelle Sosa is a 76 y.o.-year-old female, returning for follow-up for subclinical hypothyroidism.  She previously saw Dr. Loanne Drilling, last visit 11 months ago.  Pt. has been dx with hypothyroidism in 2011 >> she declined levothyroxine treatment at that time.  Subsequent TSH levels have been fluctuating around the upper limit of the target range.  2 weeks ago, she had Covid 19 >> she then tested negative for the last 3 days. She is feeling better.  I reviewed pt's thyroid tests: Lab Results  Component Value Date   TSH 4.87 07/01/2021   TSH 3.85 12/30/2020   TSH 4.22 07/02/2020   TSH 7.870 (H) 02/03/2020   TSH 5.750 (H) 12/24/2019   TSH 5.190 (H) 09/23/2019   TSH 4.540 (H) 01/16/2018   TSH 4.820 (H) 04/24/2017   TSH 4.870 (H) 01/18/2017   TSH 3.051 11/06/2014   FREET4 0.94 07/01/2021   FREET4 0.79 12/30/2020   FREET4 1.00 07/02/2020   FREET4 1.34 04/24/2017   FREET4 1.14 07/16/2013   FREET4 1.08 02/13/2013   T3FREE 3.2 02/13/2013   Antithyroid antibodies: No results found for: "THGAB" No components found for: "TPOAB"  Pt denies - weight gain - fatigue  - just in the last 2 weeks due to Covid inf.  - cold intolerance - depression - constipation  She has: - hair thinning   Pt denies feeling nodules in neck, hoarseness, dysphagia/odynophagia.  She has + FH of thyroid disorders in: older sister - hypothyrodism. No FH of thyroid cancer.  No h/o radiation tx to head or neck. No recent use of iodine supplements.  Pt. also has a history of HTN, hep C, history of stroke, osteoporosis (PCP discussed with her about Prolia versus Fosamax), distant h/o BrCA in her 72s - s/p Chemotx's, no RxTx.  ROS: + see HPI  Past Medical History:  Diagnosis Date   Hepatitis C    Hypertension    Personal history of chemotherapy    Stroke [facility])    Past Surgical History:  Procedure Laterality Date    KNEE SURGERY  2005   LEG SURGERY     right leg, a rod.   MASTECTOMY Right    WRIST SURGERY  2008   Social History   Socioeconomic History   Marital status: Single    Spouse name: Not on file   Number of children: Not on file   Years of education: Not on file   Highest education level: Not on file  Occupational History   Not on file  Tobacco Use   Smoking status: Former    Types: Cigarettes    Quit date: 02/14/2011    Years since quitting: 11.2   Smokeless tobacco: Never  Vaping Use   Vaping Use: Never used  Substance and Sexual Activity   Alcohol use: No   Drug use: No   Sexual activity: Not Currently  Other Topics Concern   Not on file  Social History Narrative   Not on file   Social Determinants of Health   Financial Resource Strain: Not on file  Food Insecurity: Not on file  Transportation Needs: Not on file  Physical Activity: Not on file  Stress: Not on file  Social Connections: Not on file  Intimate Partner Violence: Not on file   Current Outpatient Medications on File Prior to Visit  Medication Sig Dispense Refill   amLODipine (NORVASC) 10 MG  tablet Take 1 tablet (10 mg total) by mouth daily. Please fill as a 90 day supply 90 tablet 1   ASPIRIN 81 PO Take 1 tablet by mouth daily.      Calcium Carbonate-Vitamin D3 600-400 MG-UNIT TABS Take 1 tablet by mouth 2 (two) times daily.      hydrochlorothiazide (MICROZIDE) 12.5 MG capsule Take 1 capsule (12.5 mg total) by mouth daily. Please fill as a 90 day supply 90 capsule 1   irbesartan (AVAPRO) 300 MG tablet Take 1 tablet (300 mg total) by mouth daily. Please fill as a 90 day supply 90 tablet 1   Multiple Vitamins-Minerals (CENTRUM SILVER 50+WOMEN PO) Take 1 tablet by mouth daily.      Omega-3 Fatty Acids (OMEGA-3 FISH OIL PO) Take 2 tablets by mouth daily.     thiamine (VITAMIN B-1) 100 MG tablet Take 100 mg by mouth daily.     vitamin B-12 (CYANOCOBALAMIN) 100 MCG tablet Take 100 mcg by mouth daily.     No  current facility-administered medications on file prior to visit.   Allergies  Allergen Reactions   Metoprolol Tartrate Other (See Comments)    Affects her mood/ lability   Cipro [Ciprofloxacin Hcl]     Joint and muscle pain   Codeine Other (See Comments)   Penicillins Other (See Comments)   Sulfa Antibiotics     States she can take Bactrim   Family History  Problem Relation Age of Onset   Heart disease Father    Hypothyroidism Sister    Breast cancer Neg Hx    PE: BP (!) 146/62 (BP Location: Left Arm, Patient Position: Sitting, Cuff Size: Normal)   Pulse 70   Ht '5\' 3"'  (1.6 m)   Wt 140 lb 12.8 oz (63.9 kg)   SpO2 97%   BMI 24.94 kg/m  Wt Readings from Last 3 Encounters:  05/18/22 140 lb 12.8 oz (63.9 kg)  01/28/22 138 lb 12.8 oz (63 kg)  10/27/21 141 lb 9.6 oz (64.2 kg)   Constitutional: normal weight, in NAD Eyes:  EOMI, no exophthalmos ENT: no neck masses palpated, no cervical lymphadenopathy Cardiovascular: RRR, No MRG Respiratory: CTA B Musculoskeletal: no deformities Skin:no rashes Neurological: no tremor with outstretched hands  ASSESSMENT: 1.  History of subclinical hypothyroidism  PLAN:  1. Patient with long-standing subclinical hypothyroidism, with latest TFTs being within the target range. - she declined levothyroxine in the past (per review of Dr. Cordelia Pen note), and, based on the last TSH, she does not need this - she appears euthyroid, without any obvious hyperthyroid signs or symptoms - she does not appear to have a goiter, thyroid nodules, or neck compression symptoms - we discussed about checking her TFTs today  - she did have COVID-19 infection 2 weeks ago and we discussed that if the TFTs are abnormal, we may need to repeat them - if the thyroid tests are abnormal in the future, with a TSH higher than 7 and/or low free thyroid hormones, I would probably suggest levothyroxine - correct intake of levothyroxine in case she needs to start, fasting,  with water, separated by at least 30 minutes from breakfast, and separated by more than 4 hours from calcium, iron, multivitamins, acid reflux medications (PPIs). - if the tests are normal, she can continue to see PCP with annual TSH levels and can be referred back to endocrinology if TSH consistently higher than 7 - she complains of higher blood pressure after her COVID-19 infection.  This does not  appear to be related to her thyroid and I advised her to contact PCP for possible need to adjust her antihypertensive regimen. - she agrees with the above plan.  - Total time spent for the visit: 25 min, in precharting, reviewing Dr. Cordelia Pen last note, obtaining medical information from the chart and from the pt, reviewing her  previous labs, evaluations, and treatments, reviewing her symptoms, counseling her about her condition (please see the discussed topics above), and developing a plan to further treat it; she had a number of questions which I addressed.  + flu shot today  Philemon Kingdom, MD PhD [facility] Endocrinology

## 2022-05-18 NOTE — Patient Instructions (Signed)
Please stop at the lab.  Return to see me as needed. 

## 2022-06-28 ENCOUNTER — Ambulatory Visit: Payer: PPO | Attending: Nurse Practitioner | Admitting: Nurse Practitioner

## 2022-06-28 ENCOUNTER — Encounter: Payer: Self-pay | Admitting: Nurse Practitioner

## 2022-06-28 VITALS — BP 145/80 | HR 68 | Temp 98.1°F | Ht 63.0 in | Wt 139.0 lb

## 2022-06-28 DIAGNOSIS — R350 Frequency of micturition: Secondary | ICD-10-CM

## 2022-06-28 DIAGNOSIS — E7800 Pure hypercholesterolemia, unspecified: Secondary | ICD-10-CM

## 2022-06-28 DIAGNOSIS — I10 Essential (primary) hypertension: Secondary | ICD-10-CM

## 2022-06-28 DIAGNOSIS — R7989 Other specified abnormal findings of blood chemistry: Secondary | ICD-10-CM | POA: Diagnosis not present

## 2022-06-28 LAB — POCT URINALYSIS DIP (CLINITEK)
Bilirubin, UA: NEGATIVE
Blood, UA: NEGATIVE
Glucose, UA: NEGATIVE mg/dL
Ketones, POC UA: NEGATIVE mg/dL
Leukocytes, UA: NEGATIVE
Nitrite, UA: NEGATIVE
POC PROTEIN,UA: NEGATIVE
Spec Grav, UA: 1.025 (ref 1.010–1.025)
Urobilinogen, UA: 0.2 E.U./dL
pH, UA: 7 (ref 5.0–8.0)

## 2022-06-28 MED ORDER — IRBESARTAN 300 MG PO TABS: 300.0000 mg | ORAL_TABLET | Freq: Every day | ORAL | 1 refills | Status: DC

## 2022-06-28 MED ORDER — AMLODIPINE BESYLATE 10 MG PO TABS: 10.0000 mg | ORAL_TABLET | Freq: Every day | ORAL | 1 refills | Status: DC

## 2022-06-28 MED ORDER — HYDROCHLOROTHIAZIDE 12.5 MG PO CAPS: 12.5000 mg | ORAL_CAPSULE | Freq: Every day | ORAL | 1 refills | Status: DC

## 2022-06-28 NOTE — Addendum Note (Signed)
Addended by: Gasper Lloyd on: 06/28/2022 02:14 PM   Modules accepted: Orders

## 2022-06-28 NOTE — Progress Notes (Signed)
Assessment & Plan:  Michelle Sosa was seen today for hypertension.  Diagnoses and all orders for this visit:  Primary hypertension -     CMP14+EGFR -     irbesartan (AVAPRO) 300 MG tablet; Take 1 tablet (300 mg total) by mouth daily. Please fill as a 90 day supply -     hydrochlorothiazide (MICROZIDE) 12.5 MG capsule; Take 1 capsule (12.5 mg total) by mouth daily. Please fill as a 90 day supply -     amLODipine (NORVASC) 10 MG tablet; Take 1 tablet (10 mg total) by mouth daily. Please fill as a 90 day supply  Hypercholesteremia -     Lipid panel  Abnormal CBC -     CBC with Differential  Urine frequency -     POCT URINALYSIS DIP (CLINITEK)    Patient has been counseled on age-appropriate routine health concerns for screening and prevention. These are reviewed and up-to-date. Referrals have been placed accordingly. Immunizations are up-to-date or declined.    Subjective:   Chief Complaint  Patient presents with   Hypertension   HPI Michelle Sosa 76 y.o. female presents to office today for follow-up to hypertension.  She has a past medical history of Right Breast CA, Hepatitis C, Hypertension, and Stroke (mild residual weakness and uses a cane to assist with mobility).     Wants to wait to after the holidays to decide on colonoscopy and osteoporosis management.   Notes urinary frequency but no dysuria, hematuria or bilateral flank pain.  HTN She does endorse seeing elevated SBP readings in the 140s at home but not occurring often. Last Home BP reading: 120s/70s.  She is taking amlodipine 10 mg daily, hydrochlorothiazide 12.5 mg daily and irbesartan 300 mg daily as prescribed BP Readings from Last 3 Encounters:  06/28/22 (!) 145/80  05/18/22 (!) 146/62  01/28/22 (!) 150/80     HYPERCHOLESTEREMIA She continues to decline statin and states her mother had a terrible time with taking statin which caused her significant pain.  She is currently taking omega-3 2 tablets daily.   Fasting lipid panel obtained today The 10-year ASCVD risk score (Arnett DK, et al., 2019) is: 23.8%   Values used to calculate the score:     Age: 43 years     Sex: Female     Is Non-Hispanic African American: No     Diabetic: No     Tobacco smoker: No     Systolic Blood Pressure: 161 mmHg     Is BP treated: Yes     HDL Cholesterol: 62 mg/dL     Total Cholesterol: 207 mg/dL  Review of Systems  Constitutional:  Negative for fever, malaise/fatigue and weight loss.  HENT: Negative.  Negative for nosebleeds.   Eyes: Negative.  Negative for blurred vision, double vision and photophobia.  Respiratory: Negative.  Negative for cough and shortness of breath.   Cardiovascular: Negative.  Negative for chest pain, palpitations and leg swelling.  Gastrointestinal: Negative.  Negative for heartburn, nausea and vomiting.  Genitourinary:  Positive for frequency.  Musculoskeletal: Negative.  Negative for myalgias.  Neurological: Negative.  Negative for dizziness, focal weakness, seizures and headaches.  Psychiatric/Behavioral: Negative.  Negative for suicidal ideas.     Past Medical History:  Diagnosis Date   Hepatitis C    Hypertension    Personal history of chemotherapy    Stroke [facility])     Past Surgical History:  Procedure Laterality Date   KNEE SURGERY  2005   LEG  SURGERY     right leg, a rod.   MASTECTOMY Right    WRIST SURGERY  2008    Family History  Problem Relation Age of Onset   Heart disease Father    Hypothyroidism Sister    Breast cancer Neg Hx     Social History Reviewed with no changes to be made today.   Outpatient Medications Prior to Visit  Medication Sig Dispense Refill   ASPIRIN 81 PO Take 1 tablet by mouth daily.      Calcium Carbonate-Vitamin D3 600-400 MG-UNIT TABS Take 1 tablet by mouth 2 (two) times daily.      Multiple Vitamins-Minerals (CENTRUM SILVER 50+WOMEN PO) Take 1 tablet by mouth daily.      Omega-3 Fatty Acids (OMEGA-3 FISH OIL PO) Take 2  tablets by mouth daily.     thiamine (VITAMIN B-1) 100 MG tablet Take 100 mg by mouth daily.     vitamin B-12 (CYANOCOBALAMIN) 100 MCG tablet Take 100 mcg by mouth daily.     amLODipine (NORVASC) 10 MG tablet Take 1 tablet (10 mg total) by mouth daily. Please fill as a 90 day supply 90 tablet 1   hydrochlorothiazide (MICROZIDE) 12.5 MG capsule Take 1 capsule (12.5 mg total) by mouth daily. Please fill as a 90 day supply 90 capsule 1   irbesartan (AVAPRO) 300 MG tablet Take 1 tablet (300 mg total) by mouth daily. Please fill as a 90 day supply 90 tablet 1   No facility-administered medications prior to visit.    Allergies  Allergen Reactions   Metoprolol Tartrate Other (See Comments)    Affects her mood/ lability   Cipro [Ciprofloxacin Hcl]     Joint and muscle pain   Codeine Other (See Comments)   Penicillins Other (See Comments)   Sulfa Antibiotics     States she can take Bactrim       Objective:    BP (!) 145/80   Pulse 68   Temp 98.1 F (36.7 C) (Temporal)   Ht _0  (1.6 m)   Wt 139 lb (63 kg)   SpO2 98%   BMI 24.62 kg/m  Wt Readings from Last 3 Encounters:  06/28/22 139 lb (63 kg)  05/18/22 140 lb 12.8 oz (63.9 kg)  01/28/22 138 lb 12.8 oz (63 kg)    Physical Exam Vitals and nursing note reviewed.  Constitutional:      Appearance: She is well-developed.  HENT:     Head: Normocephalic and atraumatic.  Cardiovascular:     Rate and Rhythm: Normal rate and regular rhythm.     Heart sounds: Normal heart sounds. No murmur heard.    No friction rub. No gallop.  Pulmonary:     Effort: Pulmonary effort is normal. No tachypnea or respiratory distress.     Breath sounds: Normal breath sounds. No decreased breath sounds, wheezing, rhonchi or rales.  Chest:     Chest wall: No tenderness.  Abdominal:     General: Bowel sounds are normal.     Palpations: Abdomen is soft.  Musculoskeletal:        General: Normal range of motion.     Cervical back: Normal range of  motion.  Skin:    General: Skin is warm and dry.  Neurological:     Mental Status: She is alert and oriented to person, place, and time.     Coordination: Coordination normal.  Psychiatric:        Behavior: Behavior normal. Behavior is cooperative.  Thought Content: Thought content normal.        Judgment: Judgment normal.          Patient has been counseled extensively about nutrition and exercise as well as the importance of adherence with medications and regular follow-up. The patient was given clear instructions to go to ER or return to medical center if symptoms don't improve, worsen or new problems develop. The patient verbalized understanding.   Follow-up: Return in about 3 months (around 09/28/2022) for HTN.   Gildardo Pounds, FNP-BC [facility] and [place], [place]   06/28/2022, 12:17 PM

## 2022-06-29 LAB — CBC WITH DIFFERENTIAL/PLATELET
Basophils Absolute: 0.1 10*3/uL (ref 0.0–0.2)
Basos: 1 %
EOS (ABSOLUTE): 0.2 10*3/uL (ref 0.0–0.4)
Eos: 3 %
Hematocrit: 42.3 % (ref 34.0–46.6)
Hemoglobin: 14.5 g/dL (ref 11.1–15.9)
Immature Grans (Abs): 0 10*3/uL (ref 0.0–0.1)
Immature Granulocytes: 0 %
Lymphocytes Absolute: 1.9 10*3/uL (ref 0.7–3.1)
Lymphs: 24 %
MCH: 32.4 pg (ref 26.6–33.0)
MCHC: 34.3 g/dL (ref 31.5–35.7)
MCV: 95 fL (ref 79–97)
Monocytes Absolute: 0.6 10*3/uL (ref 0.1–0.9)
Monocytes: 7 %
Neutrophils Absolute: 5.3 10*3/uL (ref 1.4–7.0)
Neutrophils: 65 %
Platelets: 350 10*3/uL (ref 150–450)
RBC: 4.47 x10E6/uL (ref 3.77–5.28)
RDW: 11.9 % (ref 11.7–15.4)
WBC: 8.1 10*3/uL (ref 3.4–10.8)

## 2022-06-29 LAB — CMP14+EGFR
ALT: 28 IU/L (ref 0–32)
AST: 26 IU/L (ref 0–40)
Albumin/Globulin Ratio: 1.3 (ref 1.2–2.2)
Albumin: 4.3 g/dL (ref 3.8–4.8)
Alkaline Phosphatase: 80 IU/L (ref 44–121)
BUN/Creatinine Ratio: 20 (ref 12–28)
BUN: 13 mg/dL (ref 8–27)
Bilirubin Total: 0.4 mg/dL (ref 0.0–1.2)
CO2: 25 mmol/L (ref 20–29)
Calcium: 10 mg/dL (ref 8.7–10.3)
Chloride: 100 mmol/L (ref 96–106)
Creatinine, Ser: 0.65 mg/dL (ref 0.57–1.00)
Globulin, Total: 3.2 g/dL (ref 1.5–4.5)
Glucose: 89 mg/dL (ref 70–99)
Potassium: 4.1 mmol/L (ref 3.5–5.2)
Sodium: 140 mmol/L (ref 134–144)
Total Protein: 7.5 g/dL (ref 6.0–8.5)
eGFR: 92 mL/min/{1.73_m2} (ref >59)

## 2022-06-29 LAB — LIPID PANEL
Chol/HDL Ratio: 4.1 ratio (ref 0.0–4.4)
Cholesterol, Total: 228 mg/dL — ABNORMAL HIGH (ref 100–199)
HDL: 55 mg/dL (ref >39)
LDL Chol Calc (NIH): 146 mg/dL — ABNORMAL HIGH (ref 0–99)
Triglycerides: 149 mg/dL (ref 0–149)
VLDL Cholesterol Cal: 27 mg/dL (ref 5–40)

## 2022-09-28 ENCOUNTER — Encounter: Payer: Self-pay | Admitting: Nurse Practitioner

## 2022-09-28 ENCOUNTER — Ambulatory Visit: Payer: PPO | Attending: Nurse Practitioner | Admitting: Nurse Practitioner

## 2022-09-28 VITALS — BP 145/74 | HR 60 | Ht 63.0 in | Wt 141.6 lb

## 2022-09-28 DIAGNOSIS — E7800 Pure hypercholesterolemia, unspecified: Secondary | ICD-10-CM | POA: Diagnosis not present

## 2022-09-28 DIAGNOSIS — I10 Essential (primary) hypertension: Secondary | ICD-10-CM | POA: Diagnosis not present

## 2022-09-28 DIAGNOSIS — Z1211 Encounter for screening for malignant neoplasm of colon: Secondary | ICD-10-CM

## 2022-09-28 NOTE — Progress Notes (Signed)
Assessment & Plan:  Michelle Sosa was seen today for hypertension.  Diagnoses and all orders for this visit:  Primary hypertension -     CMP14+EGFR Follow-up in 2 weeks for virtual visit to review blood pressure log and determine if hydrochlorothiazide needs to be increased Continue all antihypertensives as prescribed.  Reminded to bring in blood pressure log for follow  up appointment.  RECOMMENDATIONS: DASH/Mediterranean Diets are healthier choices for HTN.     Hypercholesteremia -     Lipid panel Continue omega 3 daily INSTRUCTIONS: Work on a low fat, heart healthy diet and participate in regular aerobic exercise program by working out at least 150 minutes per week; 5 days a week-30 minutes per day. Avoid red meat/beef/steak,  fried foods. junk foods, sodas, sugary drinks, unhealthy snacking, alcohol and smoking.  Drink at least 80 oz of water per day and monitor your carbohydrate intake daily.    Colon cancer screening -     Cologuard -     Ambulatory referral to Gastroenterology    Patient has been counseled on age-appropriate routine health concerns for screening and prevention. These are reviewed and up-to-date. Referrals have been placed accordingly. Immunizations are up-to-date or declined.    Subjective:   Chief Complaint  Patient presents with   Hypertension    Michelle Sosa 77 y.o. female presents to office today for follow-up to hypertension.  She has a past medical history of Right Breast CA, Hepatitis C, Hypertension, and Stroke (mild residual weakness and uses a cane to assist with mobility).   Patient has been counseled on age-appropriate routine health concerns for screening and prevention. These are reviewed and up-to-date. Referrals have been placed accordingly. Immunizations are up-to-date or declined.     MAMMOGRAM: Up-to-date BONE density: Up-to-date.  Osteoporosis and will need to repeat bone density this year.  She has been taking the recommended dose of  calcium and vitamin D. COLONOSCOPY: Overdue. Referral placed today   HTN She states home blood pressure readings are normally lower compared to today however over the past several days she has noticed that her blood pressure readings have been similar to today's reading.  She is currently taking irbesartan 300 mg daily, amlodipine 10 mg daily and hydrochlorothiazide 12.5 mg daily. BP Readings from Last 3 Encounters:  09/28/22 (!) 145/74  06/28/22 (!) 145/80  05/18/22 (!) 146/62     Lipids Currently taking 3gms of omega 3. Declines statin due to possible side effects The 10-year ASCVD risk score (Arnett DK, et al., 2019) is: 24.2%   Values used to calculate the score:     Age: 39 years     Sex: Female     Is Non-Hispanic African American: No     Diabetic: No     Tobacco smoker: No     Systolic Blood Pressure: [id number] mmHg     Is BP treated: Yes     HDL Cholesterol: 55 mg/dL     Total Cholesterol: 228 mg/dL   Review of Systems  Constitutional:  Negative for fever, malaise/fatigue and weight loss.  HENT: Negative.  Negative for nosebleeds.   Eyes: Negative.  Negative for blurred vision, double vision and photophobia.  Respiratory: Negative.  Negative for cough and shortness of breath.   Cardiovascular: Negative.  Negative for chest pain, palpitations and leg swelling.  Gastrointestinal: Negative.  Negative for heartburn, nausea and vomiting.  Musculoskeletal: Negative.  Negative for myalgias.  Neurological: Negative.  Negative for dizziness, focal weakness, seizures and headaches.  Psychiatric/Behavioral: Negative.  Negative for suicidal ideas.     Past Medical History:  Diagnosis Date   Hepatitis C    Hypertension    Personal history of chemotherapy    Stroke [facility])     Past Surgical History:  Procedure Laterality Date   KNEE SURGERY  2005   LEG SURGERY     right leg, a rod.   MASTECTOMY Right    WRIST SURGERY  2008    Family History  Problem Relation Age of Onset    Heart disease Father    Hypothyroidism Sister    Breast cancer Neg Hx     Social History Reviewed with no changes to be made today.   Outpatient Medications Prior to Visit  Medication Sig Dispense Refill   amLODipine (NORVASC) 10 MG tablet Take 1 tablet (10 mg total) by mouth daily. Please fill as a 90 day supply 90 tablet 1   ASPIRIN 81 PO Take 1 tablet by mouth daily.      Calcium Carbonate-Vitamin D3 600-400 MG-UNIT TABS Take 1 tablet by mouth 2 (two) times daily.      hydrochlorothiazide (MICROZIDE) 12.5 MG capsule Take 1 capsule (12.5 mg total) by mouth daily. Please fill as a 90 day supply 90 capsule 1   irbesartan (AVAPRO) 300 MG tablet Take 1 tablet (300 mg total) by mouth daily. Please fill as a 90 day supply 90 tablet 1   Multiple Vitamins-Minerals (CENTRUM SILVER 50+WOMEN PO) Take 1 tablet by mouth daily.      Omega-3 Fatty Acids (OMEGA-3 FISH OIL PO) Take 3 tablets by mouth daily.     thiamine (VITAMIN B-1) 100 MG tablet Take 100 mg by mouth daily.     vitamin B-12 (CYANOCOBALAMIN) 100 MCG tablet Take 100 mcg by mouth daily.     No facility-administered medications prior to visit.    Allergies  Allergen Reactions   Metoprolol Tartrate Other (See Comments)    Affects her mood/ lability   Cipro [Ciprofloxacin Hcl]     Joint and muscle pain   Codeine Other (See Comments)   Penicillins Other (See Comments)   Sulfa Antibiotics     States she can take Bactrim       Objective:    BP (!) 145/74   Pulse 60   Ht 5' 3"$  (1.6 m)   Wt 141 lb 9.6 oz (64.2 kg)   SpO2 97%   BMI 25.08 kg/m  Wt Readings from Last 3 Encounters:  09/28/22 141 lb 9.6 oz (64.2 kg)  06/28/22 139 lb (63 kg)  05/18/22 140 lb 12.8 oz (63.9 kg)    Physical Exam Vitals and nursing note reviewed.  Constitutional:      Appearance: She is well-developed.  HENT:     Head: Normocephalic and atraumatic.  Cardiovascular:     Rate and Rhythm: Normal rate and regular rhythm.     Heart sounds: Normal  heart sounds. No murmur heard.    No friction rub. No gallop.  Pulmonary:     Effort: Pulmonary effort is normal. No tachypnea or respiratory distress.     Breath sounds: Normal breath sounds. No decreased breath sounds, wheezing, rhonchi or rales.  Chest:     Chest wall: No tenderness.  Abdominal:     General: Bowel sounds are normal.     Palpations: Abdomen is soft.  Musculoskeletal:        General: Normal range of motion.     Cervical back: Normal range of motion.  Skin:    General: Skin is warm and dry.  Neurological:     Mental Status: She is alert and oriented to person, place, and time.     Coordination: Coordination normal.  Psychiatric:        Behavior: Behavior normal. Behavior is cooperative.        Thought Content: Thought content normal.        Judgment: Judgment normal.          Patient has been counseled extensively about nutrition and exercise as well as the importance of adherence with medications and regular follow-up. The patient was given clear instructions to go to ER or return to medical center if symptoms don't improve, worsen or new problems develop. The patient verbalized understanding.   Follow-up: Return in about 2 weeks (around 10/12/2022) for VIRTUAL VISIT IN 2 WEEKS DOUBLE BOOK AN 1110 OR 130 SLOT.   Gildardo Pounds, FNP-BC [facility] and [facility] [place], [place]   09/28/2022, 9:19 AM

## 2022-09-29 LAB — CMP14+EGFR
ALT: 25 IU/L (ref 0–32)
AST: 26 IU/L (ref 0–40)
Albumin/Globulin Ratio: 1.4 (ref 1.2–2.2)
Albumin: 4.2 g/dL (ref 3.8–4.8)
Alkaline Phosphatase: 80 IU/L (ref 44–121)
BUN/Creatinine Ratio: 18 (ref 12–28)
BUN: 11 mg/dL (ref 8–27)
Bilirubin Total: 0.4 mg/dL (ref 0.0–1.2)
CO2: 23 mmol/L (ref 20–29)
Calcium: 10.5 mg/dL — ABNORMAL HIGH (ref 8.7–10.3)
Chloride: 99 mmol/L (ref 96–106)
Creatinine, Ser: 0.6 mg/dL (ref 0.57–1.00)
Globulin, Total: 3 g/dL (ref 1.5–4.5)
Glucose: 93 mg/dL (ref 70–99)
Potassium: 4.1 mmol/L (ref 3.5–5.2)
Sodium: 138 mmol/L (ref 134–144)
Total Protein: 7.2 g/dL (ref 6.0–8.5)
eGFR: 94 mL/min/{1.73_m2} (ref >59)

## 2022-09-29 LAB — LIPID PANEL
Chol/HDL Ratio: 4.1 ratio (ref 0.0–4.4)
Cholesterol, Total: 219 mg/dL — ABNORMAL HIGH (ref 100–199)
HDL: 53 mg/dL (ref >39)
LDL Chol Calc (NIH): 143 mg/dL — ABNORMAL HIGH (ref 0–99)
Triglycerides: 127 mg/dL (ref 0–149)
VLDL Cholesterol Cal: 23 mg/dL (ref 5–40)

## 2022-10-02 ENCOUNTER — Other Ambulatory Visit: Payer: Self-pay | Admitting: Nurse Practitioner

## 2022-10-02 MED ORDER — EZETIMIBE 10 MG PO TABS: 10.0000 mg | ORAL_TABLET | Freq: Every day | ORAL | 3 refills | Status: DC

## 2022-10-12 ENCOUNTER — Ambulatory Visit: Payer: PPO | Attending: Nurse Practitioner | Admitting: Nurse Practitioner

## 2022-10-12 ENCOUNTER — Encounter: Payer: Self-pay | Admitting: Nurse Practitioner

## 2022-10-12 DIAGNOSIS — I10 Essential (primary) hypertension: Secondary | ICD-10-CM | POA: Diagnosis not present

## 2022-10-12 DIAGNOSIS — E7800 Pure hypercholesterolemia, unspecified: Secondary | ICD-10-CM | POA: Diagnosis not present

## 2022-10-12 NOTE — Progress Notes (Signed)
Virtual Visit Note  I discussed the limitations, risks, security and privacy concerns of performing an evaluation and management service by video and the availability of in person appointments. I also discussed with the patient that there may be a patient responsible charge related to this service. The patient expressed understanding and agreed to proceed.    I connected with Nathanial Rancher on 10/12/22  at  11:10 AM EST  EDT by VIDEO and verified that I am speaking with the correct person using two identifiers.   Location of Patient: Private Residence   Location of Provider: [place] participating in VIRTUAL visit: [name] FNP-BC [place]    History of Present Illness: VIRTUAL visit for: HTN Ms. Lisle was asked by me to log her home blood pressure readings due to persistently fluctuating blood pressure readings in the office.  130/67 129/62 130/66 122/71 130/73 124/67 127/72 132/73 129/71 113/58 134/69 125/67 129/76 131/73 129/76 137/72 127/69 Based on home readings over the past several days. Her blood pressure average would be considered normal.  HPL Ms Bache declines to take statin or lipid lowering medications. States she has researched the side effects and prefers to work on her diet and adhere to taking her omega 3 TID as instructed.   Past Medical History:  Diagnosis Date   Hepatitis C    Hypertension    Personal history of chemotherapy    Stroke [facility])     Past Surgical History:  Procedure Laterality Date   KNEE SURGERY  2005   LEG SURGERY     right leg, a rod.   MASTECTOMY Right    WRIST SURGERY  2008    Family History  Problem Relation Age of Onset   Heart disease Father    Hypothyroidism Sister    Breast cancer Neg Hx     Social History   Socioeconomic History   Marital status: Single    Spouse name: Not on file   Number of children: Not on file   Years of education:  Not on file   Highest education level: Not on file  Occupational History   Not on file  Tobacco Use   Smoking status: Former    Types: Cigarettes    Quit date: 02/14/2011    Years since quitting: 11.6   Smokeless tobacco: Never  Vaping Use   Vaping Use: Never used  Substance and Sexual Activity   Alcohol use: No   Drug use: No   Sexual activity: Not Currently  Other Topics Concern   Not on file  Social History Narrative   Not on file   Social Determinants of Health   Financial Resource Strain: Not on file  Food Insecurity: Not on file  Transportation Needs: Not on file  Physical Activity: Not on file  Stress: Not on file  Social Connections: Not on file     Observations/Objective: Awake, alert and oriented x 3   Review of Systems  Constitutional:  Negative for fever, malaise/fatigue and weight loss.  HENT: Negative.  Negative for nosebleeds.   Eyes: Negative.  Negative for blurred vision, double vision and photophobia.  Respiratory: Negative.  Negative for cough and shortness of breath.   Cardiovascular: Negative.  Negative for chest pain, palpitations and leg swelling.  Gastrointestinal: Negative.  Negative for heartburn, nausea and vomiting.  Musculoskeletal: Negative.  Negative for myalgias.  Neurological: Negative.  Negative for dizziness, focal weakness, seizures and headaches.  Psychiatric/Behavioral:  Negative.  Negative for suicidal ideas.     Assessment and Plan: Diagnoses and all orders for this visit:  Primary hypertension Continue all antihypertensives as prescribed.  Reminded to bring in blood pressure log for follow  up appointment.  RECOMMENDATIONS: DASH/Mediterranean Diets are healthier choices for HTN.    Hypercholesteremia INSTRUCTIONS: Work on a low fat, heart healthy diet and participate in regular aerobic exercise program by working out at least 150 minutes per week; 5 days a week-30 minutes per day. Avoid red meat/beef/steak,  fried foods.  junk foods, sodas, sugary drinks, unhealthy snacking, alcohol and smoking.  Drink at least 80 oz of water per day and monitor your carbohydrate intake daily.      Follow Up Instructions Return in about 3 months (around 01/10/2023).     I discussed the assessment and treatment plan with the patient. The patient was provided an opportunity to ask questions and all were answered. The patient agreed with the plan and demonstrated an understanding of the instructions.   The patient was advised to call back or seek an in-person evaluation if the symptoms worsen or if the condition fails to improve as anticipated.  I provided 11 minutes of face-to-face time during this encounter including median intraservice time, reviewing previous notes, labs, imaging, medications and explaining diagnosis and management.  Gildardo Pounds, FNP-BC

## 2022-11-04 ENCOUNTER — Ambulatory Visit: Payer: PPO | Attending: Nurse Practitioner

## 2022-11-04 DIAGNOSIS — Z0000 Encounter for general adult medical examination without abnormal findings: Secondary | ICD-10-CM | POA: Diagnosis not present

## 2022-11-04 NOTE — Patient Instructions (Signed)
Michelle Sosa , Thank you for taking time to come for your Medicare Wellness Visit. I appreciate your ongoing commitment to your health goals. Please review the following plan we discussed and let me know if I can assist you in the future.   These are the goals we discussed:  Goals   None     This is a list of the screening recommended for you and due dates:  Health Maintenance  Topic Date Due   COVID-19 Vaccine (4 - 2023-24 season) 04/15/2022   Stool Blood Test  01/26/2023   Medicare Annual Wellness Visit  11/04/2023   Mammogram  04/13/2024   DTaP/Tdap/Td vaccine (2 - Td or Tdap) 10/21/2031   Pneumonia Vaccine  Completed   Flu Shot  Completed   DEXA scan (bone density measurement)  Completed   Hepatitis C Screening: USPSTF Recommendation to screen - Ages 45-79 yo.  Completed   HPV Vaccine  Aged Out   Colon Cancer Screening  Discontinued   Zoster (Shingles) Vaccine  Discontinued   Health Maintenance After Age 38 After age 35, you are at a higher risk for certain long-term diseases and infections as well as injuries from falls. Falls are a major cause of broken bones and head injuries in people who are older than age 78. Getting regular preventive care can help to keep you healthy and well. Preventive care includes getting regular testing and making lifestyle changes as recommended by your health care provider. Talk with your health care provider about: Which screenings and tests you should have. A screening is a test that checks for a disease when you have no symptoms. A diet and exercise plan that is right for you. What should I know about screenings and tests to prevent falls? Screening and testing are the best ways to find a health problem early. Early diagnosis and treatment give you the best chance of managing medical conditions that are common after age 53. Certain conditions and lifestyle choices may make you more likely to have a fall. Your health care provider may  recommend: Regular vision checks. Poor vision and conditions such as cataracts can make you more likely to have a fall. If you wear glasses, make sure to get your prescription updated if your vision changes. Medicine review. Work with your health care provider to regularly review all of the medicines you are taking, including over-the-counter medicines. Ask your health care provider about any side effects that may make you more likely to have a fall. Tell your health care provider if any medicines that you take make you feel dizzy or sleepy. Strength and balance checks. Your health care provider may recommend certain tests to check your strength and balance while standing, walking, or changing positions. Foot health exam. Foot pain and numbness, as well as not wearing proper footwear, can make you more likely to have a fall. Screenings, including: Osteoporosis screening. Osteoporosis is a condition that causes the bones to get weaker and break more easily. Blood pressure screening. Blood pressure changes and medicines to control blood pressure can make you feel dizzy. Depression screening. You may be more likely to have a fall if you have a fear of falling, feel depressed, or feel unable to do activities that you used to do. Alcohol use screening. Using too much alcohol can affect your balance and may make you more likely to have a fall. Follow these instructions at home: Lifestyle Do not drink alcohol if: Your health care provider tells you not to  drink. If you drink alcohol: Limit how much you have to: 0-1 drink a day for women. 0-2 drinks a day for men. Know how much alcohol is in your drink. In the [place] one drink equals one 12 oz bottle of beer (355 mL), one 5 oz glass of wine (148 mL), or one 1 oz glass of hard liquor (44 mL). Do not use any products that contain nicotine or tobacco. These products include cigarettes, chewing tobacco, and vaping devices, such as e-cigarettes. If you need  help quitting, ask your health care provider. Activity  Follow a regular exercise program to stay fit. This will help you maintain your balance. Ask your health care provider what types of exercise are appropriate for you. If you need a cane or walker, use it as recommended by your health care provider. Wear supportive shoes that have nonskid soles. Safety  Remove any tripping hazards, such as rugs, cords, and clutter. Install safety equipment such as grab bars in bathrooms and safety rails on stairs. Keep rooms and walkways well-lit. General instructions Talk with your health care provider about your risks for falling. Tell your health care provider if: You fall. Be sure to tell your health care provider about all falls, even ones that seem minor. You feel dizzy, tiredness (fatigue), or off-balance. Take over-the-counter and prescription medicines only as told by your health care provider. These include supplements. Eat a healthy diet and maintain a healthy weight. A healthy diet includes low-fat dairy products, low-fat (lean) meats, and fiber from whole grains, beans, and lots of fruits and vegetables. Stay current with your vaccines. Schedule regular health, dental, and eye exams. Summary Having a healthy lifestyle and getting preventive care can help to protect your health and wellness after age 41. Screening and testing are the best way to find a health problem early and help you avoid having a fall. Early diagnosis and treatment give you the best chance for managing medical conditions that are more common for people who are older than age 51. Falls are a major cause of broken bones and head injuries in people who are older than age 82. Take precautions to prevent a fall at home. Work with your health care provider to learn what changes you can make to improve your health and wellness and to prevent falls. This information is not intended to replace advice given to you by your health  care provider. Make sure you discuss any questions you have with your health care provider. Document Revised: 12/21/2020 Document Reviewed: 12/21/2020 Elsevier Patient Education  [place].

## 2022-11-04 NOTE — Progress Notes (Signed)
Subjective:   Michelle Sosa is a 77 y.o. female who presents for Medicare Annual (Subsequent) preventive examination.  Review of Systems     I connected with Michelle Sosa on 11/04/2022 at 9:30 by telephone and verified that I am speaking with the correct person using two identifiers. I discussed the limitations, risks, security and privacy concerns of performing an evaluation and management service by telephone and the availability of in person appointments. I also discussed with the patient that there may be a patient responsible charge related to this service. The patient expressed understanding and agreed to proceed.   Patient location: Home My Location: [place] on the telephone call: Myself and Patinet     PATIENT COMPLETED MEDICARE QUESTIONNAIRE VIA MYCHART         Objective:    There were no vitals filed for this visit. There is no height or weight on file to calculate BMI.     11/04/2022    9:33 AM 10/27/2021   10:09 AM 04/24/2017    6:00 PM 04/24/2017   10:16 AM 01/18/2017   11:12 AM 07/14/2016   12:33 PM 01/07/2016    2:45 PM  Advanced Directives  Does Patient Have a Medical Advance Directive? No No No  No No No  Would patient like information on creating a medical advance directive? Yes (ED - Information included in AVS) No - Patient declined  No - Patient declined   No - patient declined information    Current Medications (verified) Outpatient Encounter Medications as of 11/04/2022  Medication Sig   amLODipine (NORVASC) 10 MG tablet Take 1 tablet (10 mg total) by mouth daily. Please fill as a 90 day supply   ASPIRIN 81 PO Take 1 tablet by mouth daily.    Calcium Carbonate-Vitamin D3 600-400 MG-UNIT TABS Take 1 tablet by mouth 2 (two) times daily.    hydrochlorothiazide (MICROZIDE) 12.5 MG capsule Take 1 capsule (12.5 mg total) by mouth daily. Please fill as a 90 day supply   irbesartan (AVAPRO) 300 MG tablet Take 1 tablet (300 mg  total) by mouth daily. Please fill as a 90 day supply   Multiple Vitamins-Minerals (CENTRUM SILVER 50+WOMEN PO) Take 1 tablet by mouth daily.    Omega-3 Fatty Acids (OMEGA-3 FISH OIL PO) Take 3 tablets by mouth daily.   thiamine (VITAMIN B-1) 100 MG tablet Take 100 mg by mouth daily.   vitamin B-12 (CYANOCOBALAMIN) 100 MCG tablet Take 100 mcg by mouth daily.   No facility-administered encounter medications on file as of 11/04/2022.    Allergies (verified) Metoprolol tartrate, Cipro [ciprofloxacin hcl], Codeine, Penicillins, and Sulfa antibiotics   History: Past Medical History:  Diagnosis Date   Allergy    Penicillin, sulfa, codeine   Cancer ([place]) 1986   Breast  had right mastectomy & chemo   Hepatitis C    Hyperlipidemia ?   Hypertension    Personal history of chemotherapy    Stroke [facility])    Thyroid disease ?   Hypothyroidism   Past Surgical History:  Procedure Laterality Date   BREAST SURGERY  1986   Right mastectomy   FRACTURE SURGERY     Right leg left knee left arm   KNEE SURGERY  2005   LEG SURGERY     right leg, a rod.   MASTECTOMY Right    WRIST SURGERY  2008   Family History  Problem Relation Age of Onset   Hyperlipidemia Mother  Heart disease Father    Hypertension Father    Hypothyroidism Sister    Breast cancer Neg Hx    Social History   Socioeconomic History   Marital status: Single    Spouse name: Not on file   Number of children: Not on file   Years of education: Not on file   Highest education level: Not on file  Occupational History   Not on file  Tobacco Use   Smoking status: Former    Types: Cigarettes    Quit date: 02/14/2011    Years since quitting: 11.7   Smokeless tobacco: Never  Vaping Use   Vaping Use: Never used  Substance and Sexual Activity   Alcohol use: Not Currently   Drug use: No   Sexual activity: Not Currently  Other Topics Concern   Not on file  Social History Narrative   Not on file   Social Determinants of  Health   Financial Resource Strain: Low Risk  (10/31/2022)   Overall Financial Resource Strain (CARDIA)    Difficulty of Paying Living Expenses: Not hard at all  Food Insecurity: No Food Insecurity (10/31/2022)   Hunger Vital Sign    Worried About Running Out of Food in the Last Year: Never true    [place] in the Last Year: Never true  Transportation Needs: No Transportation Needs (10/31/2022)   PRAPARE - Hydrologist (Medical): No    Lack of Transportation (Non-Medical): No  Physical Activity: Unknown (10/31/2022)   Exercise Vital Sign    Days of Exercise per Week: 0 days    Minutes of Exercise per Session: Not on file  Stress: No Stress Concern Present (10/31/2022)   [place]    Feeling of Stress : Only a little  Social Connections: Unknown (10/31/2022)   Social Connection and Isolation Panel [NHANES]    Frequency of Communication with Friends and Family: More than three times a week    Frequency of Social Gatherings with Friends and Family: Twice a week    Attends Religious Services: Not on Advertising copywriter or Organizations: No    Attends Archivist Meetings: Never    Marital Status: Never married    Tobacco Counseling Counseling given: Not Answered   Clinical Intake:  Pre-visit preparation completed: Yes  Pain : No/denies pain     Nutritional Risks: None Diabetes: No  How often do you need to have someone help you when you read instructions, pamphlets, or other written materials from your doctor or pharmacy?: (P) 1 - Never  Diabetic?NO  Interpreter Needed?: No      Activities of Daily Living    10/31/2022   11:15 AM 09/12/2022   10:43 AM  In your present state of health, do you have any difficulty performing the following activities:  Hearing? 0 0  Vision? 0 0  Difficulty concentrating or making decisions? 0 0  Walking or climbing  stairs? 1 1  Dressing or bathing? 0 0  Doing errands, shopping? 0 0  Preparing Food and eating ? N N  Using the Toilet? N N  In the past six months, have you accidently leaked urine? N N  Do you have problems with loss of bowel control? N N  Managing your Medications? N N  Managing your Finances? N N  Housekeeping or managing your Housekeeping? N N    Patient Care Team:  Gildardo Pounds, NP as PCP - General (Nurse Practitioner)  Indicate any recent Medical Services you may have received from other than [facility] providers in the past year (date may be approximate).     Assessment:   This is a routine wellness examination for Pepper.  Hearing/Vision screen No results found.  Dietary issues and exercise activities discussed:     Goals Addressed   None    Depression Screen    11/04/2022    9:33 AM 09/28/2022    8:44 AM 06/28/2022    9:09 AM 10/27/2021   10:09 AM 09/25/2020    1:40 PM 09/23/2019    9:12 AM 01/16/2018    1:58 PM  PHQ 2/9 Scores  PHQ - 2 Score 0 0 0 0 0 0 0  PHQ- 9 Score  0 0  0 1 1    Fall Risk    10/31/2022   11:15 AM 09/28/2022    8:42 AM 09/12/2022   10:43 AM 06/28/2022    9:06 AM 01/28/2022    8:52 AM  Fall Risk   Falls in the past year? 0 0 0 0 0  Number falls in past yr:  0 0 0 0  Injury with Fall?  0 0 0 0  Risk for fall due to :  No Fall Risks   No Fall Risks  Follow up  Falls evaluation completed  Falls evaluation completed     FALL RISK PREVENTION PERTAINING TO THE HOME:  Any stairs in or around the home? Yes  If so, are there any without handrails? No  Home free of loose throw rugs in walkways, pet beds, electrical cords, etc? Yes  Adequate lighting in your home to reduce risk of falls? Yes   ASSISTIVE DEVICES UTILIZED TO PREVENT FALLS:  Life alert? No  Use of a cane, walker or w/c? Yes  Grab bars in the bathroom? Yes  Shower chair or bench in shower? Yes  Elevated toilet seat or a handicapped toilet? Yes   TIMED UP AND GO:  Was the  test performed? No .  Length of time to ambulate 10 feet: N/A sec.   Gait slow and steady with assistive device  Cognitive Function:    11/04/2022    9:34 AM 10/27/2021   10:10 AM  MMSE - Mini Mental State Exam  Orientation to time 5 5  Orientation to Place 5 5  Registration 3 3  Attention/ Calculation 5 5  Recall 3 3  Language- name 2 objects 2 2  Language- repeat 1 1  Language- follow 3 step command 3 3  Language- read & follow direction 1 1  Write a sentence 1 1  Copy design 1 1  Total score 30 30        11/04/2022    9:35 AM  6CIT Screen  What Year? 0 points  What month? 0 points  What time? 0 points  Count back from 20 0 points  Months in reverse 0 points  Repeat phrase 0 points  Total Score 0 points    Immunizations Immunization History  Administered Date(s) Administered   Fluad Quad(high Dose 65+) 05/09/2019, 05/18/2022   Influenza, High Dose Seasonal PF 05/10/2018   Influenza,inj,Quad PF,6+ Mos 04/24/2017   Influenza-Unspecified 05/22/2020, 06/10/2021   PFIZER(Purple Top)SARS-COV-2 Vaccination 09/23/2019, 10/18/2019, 05/30/2020   Pneumococcal Conjugate-13 06/20/2013   Pneumococcal Polysaccharide-23 05/08/2015   Tdap 10/20/2021   Tetanus 10/20/2021    TDAP status: Up to date  Flu Vaccine status:  Up to date  Pneumococcal vaccine status: Up to date  Covid-19 vaccine status: Completed vaccines  Qualifies for Shingles Vaccine? Yes   Zostavax completed No   Shingrix Completed?: No.    Education has been provided regarding the importance of this vaccine. Patient has been advised to call insurance company to determine out of pocket expense if they have not yet received this vaccine. Advised may also receive vaccine at local pharmacy or Health Dept. Verbalized acceptance and understanding.  Screening Tests Health Maintenance  Topic Date Due   COVID-19 Vaccine (4 - 2023-24 season) 04/15/2022   Medicare Annual Wellness (AWV)  10/28/2022   COLON CANCER  SCREENING ANNUAL FOBT  01/26/2023   MAMMOGRAM  04/13/2024   DTaP/Tdap/Td (2 - Td or Tdap) 10/21/2031   Pneumonia Vaccine 66+ Years old  Completed   INFLUENZA VACCINE  Completed   DEXA SCAN  Completed   Hepatitis C Screening  Completed   HPV VACCINES  Aged Out   COLONOSCOPY (Pts 45-56yrs Insurance coverage will need to be confirmed)  Discontinued   Zoster Vaccines- Shingrix  Discontinued    Health Maintenance  Health Maintenance Due  Topic Date Due   COVID-19 Vaccine (4 - 2023-24 season) 04/15/2022   Medicare Annual Wellness (AWV)  10/28/2022    Colorectal cancer screening: Type of screening: FOBT/FIT. Completed 01/25/2022. Repeat every 1 years  Mammogram status: Completed 04/13/2022. Repeat every year  Bone Density status: Completed 04/13/2022. Results reflect: Bone density results: OSTEOPOROSIS. Repeat every 2 years.  Lung Cancer Screening: (Low Dose CT Chest recommended if Age 74-80 years, 30 pack-year currently smoking OR have quit w/in 15years.) does not qualify.   Lung Cancer Screening Referral: N/A  Additional Screening:  Hepatitis C Screening: does qualify; Completed 03/25/2020  Vision Screening: Recommended annual ophthalmology exams for early detection of glaucoma and other disorders of the eye. Is the patient up to date with their annual eye exam?  No  Who is the provider or what is the name of the office in which the patient attends annual eye exams? Patient would like to find her own eye doctor If pt is not established with a provider, would they like to be referred to a provider to establish care? No .   Dental Screening: Recommended annual dental exams for proper oral hygiene  Community Resource Referral / Chronic Care Management: CRR required this visit?  No   CCM required this visit?  No      Plan:     I have personally reviewed and noted the following in the patient's chart:   Medical and social history Use of alcohol, tobacco or illicit drugs   Current medications and supplements including opioid prescriptions. Patient is not currently taking opioid prescriptions. Functional ability and status Nutritional status Physical activity Advanced directives List of other physicians Hospitalizations, surgeries, and ER visits in previous 12 months Vitals Screenings to include cognitive, depression, and falls Referrals and appointments  In addition, I have reviewed and discussed with patient certain preventive protocols, quality metrics, and best practice recommendations. A written personalized care plan for preventive services as well as general preventive health recommendations were provided to patient.     Gomez Cleverly, Zeb   11/04/2022   Nurse Notes: I spent 20 minutes on this telephone encounter AVS mailed to patinet

## 2022-11-21 DIAGNOSIS — Z1211 Encounter for screening for malignant neoplasm of colon: Secondary | ICD-10-CM | POA: Diagnosis not present

## 2022-11-27 LAB — COLOGUARD: COLOGUARD: POSITIVE — AB

## 2022-11-29 ENCOUNTER — Other Ambulatory Visit: Payer: Self-pay | Admitting: Nurse Practitioner

## 2022-11-29 DIAGNOSIS — Z1211 Encounter for screening for malignant neoplasm of colon: Secondary | ICD-10-CM

## 2022-11-30 ENCOUNTER — Encounter: Payer: Self-pay | Admitting: Pharmacist

## 2022-11-30 ENCOUNTER — Ambulatory Visit: Payer: PPO | Attending: Nurse Practitioner | Admitting: Pharmacist

## 2022-11-30 DIAGNOSIS — I10 Essential (primary) hypertension: Secondary | ICD-10-CM

## 2022-11-30 MED ORDER — IRBESARTAN 300 MG PO TABS: 300.0000 mg | ORAL_TABLET | Freq: Every day | ORAL | 1 refills | Status: DC

## 2022-11-30 MED ORDER — HYDROCHLOROTHIAZIDE 12.5 MG PO CAPS: 12.5000 mg | ORAL_CAPSULE | Freq: Every day | ORAL | 1 refills | Status: DC

## 2022-11-30 MED ORDER — AMLODIPINE BESYLATE 10 MG PO TABS: 10.0000 mg | ORAL_TABLET | Freq: Every day | ORAL | 1 refills | Status: DC

## 2022-11-30 NOTE — Progress Notes (Signed)
11/30/2022 Name: Michelle Sosa MRN: [id number] DOB: 04-Aug-1946  No chief complaint on file.  Michelle Sosa is a 77 y.o. year old female who presented for a telephone visit.   They were referred to the pharmacist by a quality report for assistance in managing hypertension.   Patient is participating in a Managed Medicaid Plan: none  Subjective:  Care Team: Primary Care Provider: Claiborne Rigg, NP ; Next Scheduled Visit: 01/05/2023.  Medication Access/Adherence  Current Pharmacy:  [facility] [phone] - [place] S[place], [place] - [address] AT [facility] OF [address] [place] [address] Phone: [phone] Fax: [phone]  [id number] - [place], [place] - [address] AT [facility] OF [address] & [address] [place] [address] Phone: [phone] Fax: [phone]   Patient reports affordability concerns with their medications: No  Patient reports access/transportation concerns to their pharmacy: No  Patient reports adherence concerns with their medications:  No     Hypertension:  Current medications: amlodipine Medications previously tried:   Patient has a validated, automated, upper arm home BP cuff Current blood pressure readings readings: 120s/60s-low 70s normally  Patient denies hypotensive s/sx including dizziness, lightheadedness.  Patient denies hypertensive symptoms including headache, chest pain, shortness of breath  Current meal patterns: compliant with sodium restriction and does not drink excessive amounts of caffeine.   Current physical activity: none   Objective:  Lab Results  Component Value Date   HGBA1C 4.9 01/18/2017    Lab Results  Component Value Date   CREATININE 0.60 09/28/2022   BUN 11 09/28/2022   NA 138 09/28/2022   K 4.1 09/28/2022   CL 99 09/28/2022   CO2 23 09/28/2022    Lab Results  Component Value Date   CHOL 219 (H)  09/28/2022   HDL 53 09/28/2022   LDLCALC 143 (H) 09/28/2022   TRIG 127 09/28/2022   CHOLHDL 4.1 09/28/2022    Medications Reviewed Today     Reviewed by Michelle Sosa, CMA (Certified Medical Assistant) on 11/04/22 at 0932  Med List Status: <None>   Medication Order Taking? Sig Documenting Provider Last Dose Status Informant  amLODipine (NORVASC) 10 MG tablet [id number] No Take 1 tablet (10 mg total) by mouth daily. Please fill as a 90 day supply Michelle Rigg, NP Taking Active   ASPIRIN 81 PO [id number] No Take 1 tablet by mouth daily.  [provider] Taking Active   Calcium Carbonate-Vitamin D3 600-400 MG-UNIT TABS [id number] No Take 1 tablet by mouth 2 (two) times daily.  [provider] Taking Active   hydrochlorothiazide (MICROZIDE) 12.5 MG capsule [id number] No Take 1 capsule (12.5 mg total) by mouth daily. Please fill as a 90 day supply Michelle Rigg, NP Taking Active   irbesartan (AVAPRO) 300 MG tablet [id number] No Take 1 tablet (300 mg total) by mouth daily. Please fill as a 90 day supply Michelle Rigg, NP Taking Active   Multiple Vitamins-Minerals (CENTRUM SILVER 50+WOMEN PO) [id number] No Take 1 tablet by mouth daily.  [provider] Taking Active   Omega-3 Fatty Acids (OMEGA-3 FISH OIL PO) [id number] No Take 3 tablets by mouth daily. [provider] Taking Active   thiamine (VITAMIN B-1) 100 MG tablet [id number] No Take 100 mg by mouth daily. [provider] Taking Active   vitamin B-12 (CYANOCOBALAMIN) 100 MCG tablet [id number] No Take 100 mcg by mouth daily. [provider] Taking Active  Assessment/Plan:   Hypertension: - Currently controlled based on today's BP and reported home blood pressures. BP goal <130/80 mmHg.  - Reviewed long term cardiovascular and renal outcomes of uncontrolled blood pressure - Reviewed appropriate blood pressure monitoring technique and reviewed goal blood pressure.  Recommended to check home blood pressure and heart rate as she is already doing.  - Recommend to continue current regimen. Refills for medications sent.      Follow Up Plan: with Michelle Sosa next month.    Michelle Sosa, PharmD, Michelle Sosa, CPP Clinical Pharmacist [facility] [phone]

## 2023-01-05 ENCOUNTER — Encounter: Payer: Self-pay | Admitting: Nurse Practitioner

## 2023-01-05 ENCOUNTER — Ambulatory Visit: Payer: PPO | Attending: Nurse Practitioner | Admitting: Nurse Practitioner

## 2023-01-05 VITALS — BP 131/71 | HR 63 | Ht 63.0 in | Wt 141.4 lb

## 2023-01-05 DIAGNOSIS — Z1231 Encounter for screening mammogram for malignant neoplasm of breast: Secondary | ICD-10-CM

## 2023-01-05 DIAGNOSIS — M810 Age-related osteoporosis without current pathological fracture: Secondary | ICD-10-CM | POA: Diagnosis not present

## 2023-01-05 DIAGNOSIS — C50911 Malignant neoplasm of unspecified site of right female breast: Secondary | ICD-10-CM

## 2023-01-05 DIAGNOSIS — E785 Hyperlipidemia, unspecified: Secondary | ICD-10-CM

## 2023-01-05 DIAGNOSIS — R399 Unspecified symptoms and signs involving the genitourinary system: Secondary | ICD-10-CM | POA: Diagnosis not present

## 2023-01-05 DIAGNOSIS — I739 Peripheral vascular disease, unspecified: Secondary | ICD-10-CM

## 2023-01-05 DIAGNOSIS — I10 Essential (primary) hypertension: Secondary | ICD-10-CM | POA: Diagnosis not present

## 2023-01-05 LAB — POCT URINALYSIS DIP (CLINITEK)
Bilirubin, UA: NEGATIVE
Blood, UA: NEGATIVE
Glucose, UA: NEGATIVE mg/dL
Ketones, POC UA: NEGATIVE mg/dL
Leukocytes, UA: NEGATIVE
Nitrite, UA: NEGATIVE
POC PROTEIN,UA: NEGATIVE
Spec Grav, UA: 1.025 (ref 1.010–1.025)
Urobilinogen, UA: 0.2 E.U./dL
pH, UA: 6 (ref 5.0–8.0)

## 2023-01-05 NOTE — Progress Notes (Signed)
Assessment & Plan:  Michelle Sosa was seen today for hypertension.  Diagnoses and all orders for this visit:  Primary hypertension -     CMP14+EGFR Continue all antihypertensives as prescribed.  Reminded to bring in blood pressure log for follow  up appointment.  RECOMMENDATIONS: DASH/Mediterranean Diets are healthier choices for HTN.    UTI symptoms -     POCT URINALYSIS DIP (CLINITEK)  Dyslipidemia -     Lipid panel Recommended cardiology referral for recommendations  Breast cancer screening by mammogram -     MM 3D SCREENING MAMMOGRAM UNILATERAL LEFT BREAST; Future  Age-related osteoporosis without current pathological fracture -     DG Bone Density; Future    Patient has been counseled on age-appropriate routine health concerns for screening and prevention. These are reviewed and up-to-date. Referrals have been placed accordingly. Immunizations are up-to-date or declined.    Subjective:   Chief Complaint  Patient presents with   Hypertension   HPI Michelle Sosa 77 y.o. female presents to office today for follow up to HTN   She has a PMH of stroke (residual weakness/uses a cane to assist with mobility), HTN, Osteoporosis, hyperlipidemia, Right Breast CA, Hepatitis C    HTN Blood pressure is well controlled. Home blood pressure readings average: 120s/60s-low 70s. She is currently taking irbesartan 300 mg daily, amlodipine 10 mg daily and hydrochlorothiazide 12.5 mg daily. BP Readings from Last 3 Encounters:  01/05/23 131/71  11/30/22 130/67  09/28/22 (!) 145/74     Osteoporosis T score -2.8. will need follow up bone density in August. She is taking the maximum dose of calcium and vitamin D at this time. Reluctant to start DMARDs.    She is hesitant to take statin due to history of Hepatitis and concerned about effects on the liver. Currently taking omega 3 (3 capsules daily) The ASCVD Risk score (Arnett DK, et al., 2019) failed to calculate for the following  reasons:   The patient has a prior MI or stroke diagnosis  Lab Results  Component Value Date   CHOL 219 (H) 09/28/2022   CHOL 228 (H) 06/28/2022   CHOL 207 (H) 01/28/2022   Lab Results  Component Value Date   HDL 53 09/28/2022   HDL 55 06/28/2022   HDL 62 01/28/2022   Lab Results  Component Value Date   LDLCALC 143 (H) 09/28/2022   LDLCALC 146 (H) 06/28/2022   LDLCALC 126 (H) 01/28/2022   Lab Results  Component Value Date   TRIG 127 09/28/2022   TRIG 149 06/28/2022   TRIG 108 01/28/2022   Lab Results  Component Value Date   CHOLHDL 4.1 09/28/2022   CHOLHDL 4.1 06/28/2022   CHOLHDL 3.3 01/28/2022     Review of Systems  Constitutional:  Negative for fever, malaise/fatigue and weight loss.  HENT: Negative.  Negative for nosebleeds.   Eyes: Negative.  Negative for blurred vision, double vision and photophobia.  Respiratory: Negative.  Negative for cough and shortness of breath.   Cardiovascular: Negative.  Negative for chest pain, palpitations and leg swelling.  Gastrointestinal: Negative.  Negative for heartburn, nausea and vomiting.  Genitourinary:  Positive for frequency.  Musculoskeletal: Negative.  Negative for myalgias.  Neurological: Negative.  Negative for dizziness, focal weakness, seizures and headaches.  Psychiatric/Behavioral: Negative.  Negative for suicidal ideas.     Past Medical History:  Diagnosis Date   Allergy    Penicillin, sulfa, codeine   Cancer (HCC) 1986   Breast  had right mastectomy &  chemo   Hepatitis C    Hyperlipidemia ?   Hypertension    Personal history of chemotherapy    Stroke [facility])    Thyroid disease ?   Hypothyroidism    Past Surgical History:  Procedure Laterality Date   BREAST SURGERY  1986   Right mastectomy   FRACTURE SURGERY     Right leg left knee left arm   KNEE SURGERY  2005   LEG SURGERY     right leg, a rod.   MASTECTOMY Right    WRIST SURGERY  2008    Family History  Problem Relation Age of Onset    Hyperlipidemia Mother    Heart disease Father    Hypertension Father    Hypothyroidism Sister    Breast cancer Neg Hx     Social History Reviewed with no changes to be made today.   Outpatient Medications Prior to Visit  Medication Sig Dispense Refill   amLODipine (NORVASC) 10 MG tablet Take 1 tablet (10 mg total) by mouth daily. Please fill as a 90 day supply 90 tablet 1   ASPIRIN 81 PO Take 1 tablet by mouth daily.      Calcium Carbonate-Vitamin D3 600-400 MG-UNIT TABS Take 1 tablet by mouth 2 (two) times daily.      hydrochlorothiazide (MICROZIDE) 12.5 MG capsule Take 1 capsule (12.5 mg total) by mouth daily. Please fill as a 90 day supply 90 capsule 1   irbesartan (AVAPRO) 300 MG tablet Take 1 tablet (300 mg total) by mouth daily. Please fill as a 90 day supply 90 tablet 1   Multiple Vitamins-Minerals (CENTRUM SILVER 50+WOMEN PO) Take 1 tablet by mouth daily.      Omega-3 Fatty Acids (OMEGA-3 FISH OIL PO) Take 3 tablets by mouth daily.     thiamine (VITAMIN B-1) 100 MG tablet Take 100 mg by mouth daily.     vitamin B-12 (CYANOCOBALAMIN) 100 MCG tablet Take 100 mcg by mouth daily.     No facility-administered medications prior to visit.    Allergies  Allergen Reactions   Metoprolol Tartrate Other (See Comments)    Affects her mood/ lability   Cipro [Ciprofloxacin Hcl]     Joint and muscle pain   Codeine Other (See Comments)   Penicillins Other (See Comments)   Sulfa Antibiotics     States she can take Bactrim       Objective:    BP 131/71 (BP Location: Left Arm, Patient Position: Sitting, Cuff Size: Normal)   Pulse 63   Ht 5\' 3"  (1.6 m)   Wt 141 lb 6.4 oz (64.1 kg)   SpO2 97%   BMI 25.05 kg/m  Wt Readings from Last 3 Encounters:  01/05/23 141 lb 6.4 oz (64.1 kg)  09/28/22 141 lb 9.6 oz (64.2 kg)  06/28/22 139 lb (63 kg)    Physical Exam Vitals and nursing note reviewed.  Constitutional:      Appearance: She is well-developed.  HENT:     Head: Normocephalic  and atraumatic.  Cardiovascular:     Rate and Rhythm: Normal rate and regular rhythm.     Heart sounds: Normal heart sounds. No murmur heard.    No friction rub. No gallop.  Pulmonary:     Effort: Pulmonary effort is normal. No tachypnea or respiratory distress.     Breath sounds: Normal breath sounds. No decreased breath sounds, wheezing, rhonchi or rales.  Chest:     Chest wall: No tenderness.  Abdominal:  General: Bowel sounds are normal.     Palpations: Abdomen is soft.  Musculoskeletal:        General: Normal range of motion.     Cervical back: Normal range of motion.  Skin:    General: Skin is warm and dry.  Neurological:     Mental Status: She is alert and oriented to person, place, and time.     Coordination: Coordination normal.  Psychiatric:        Behavior: Behavior normal. Behavior is cooperative.        Thought Content: Thought content normal.        Judgment: Judgment normal.          Patient has been counseled extensively about nutrition and exercise as well as the importance of adherence with medications and regular follow-up. The patient was given clear instructions to go to ER or return to medical center if symptoms don't improve, worsen or new problems develop. The patient verbalized understanding.   Follow-up: Return in about 3 months (around 04/07/2023).   Claiborne Rigg, FNP-BC [facility] [place], [place] [phone]   01/05/2023, 12:17 PM

## 2023-01-06 LAB — CMP14+EGFR
ALT: 23 IU/L (ref 0–32)
AST: 25 IU/L (ref 0–40)
Albumin/Globulin Ratio: 1.3 (ref 1.2–2.2)
Albumin: 4.3 g/dL (ref 3.8–4.8)
Alkaline Phosphatase: 82 IU/L (ref 44–121)
BUN/Creatinine Ratio: 36 — ABNORMAL HIGH (ref 12–28)
BUN: 25 mg/dL (ref 8–27)
Bilirubin Total: 0.5 mg/dL (ref 0.0–1.2)
CO2: 21 mmol/L (ref 20–29)
Calcium: 9.5 mg/dL (ref 8.7–10.3)
Chloride: 100 mmol/L (ref 96–106)
Creatinine, Ser: 0.69 mg/dL (ref 0.57–1.00)
Globulin, Total: 3.2 g/dL (ref 1.5–4.5)
Glucose: 94 mg/dL (ref 70–99)
Potassium: 3.7 mmol/L (ref 3.5–5.2)
Sodium: 139 mmol/L (ref 134–144)
Total Protein: 7.5 g/dL (ref 6.0–8.5)
eGFR: 91 mL/min/{1.73_m2} (ref >59)

## 2023-01-06 LAB — LIPID PANEL
Chol/HDL Ratio: 3.8 ratio (ref 0.0–4.4)
Cholesterol, Total: 224 mg/dL — ABNORMAL HIGH (ref 100–199)
HDL: 59 mg/dL (ref >39)
LDL Chol Calc (NIH): 150 mg/dL — ABNORMAL HIGH (ref 0–99)
Triglycerides: 83 mg/dL (ref 0–149)
VLDL Cholesterol Cal: 15 mg/dL (ref 5–40)

## 2023-01-17 DIAGNOSIS — C50911 Malignant neoplasm of unspecified site of right female breast: Secondary | ICD-10-CM | POA: Diagnosis not present

## 2023-02-13 ENCOUNTER — Encounter: Payer: Self-pay | Admitting: Gastroenterology

## 2023-02-13 ENCOUNTER — Ambulatory Visit: Payer: PPO | Admitting: Gastroenterology

## 2023-02-13 VITALS — BP 136/66 | HR 79 | Ht 63.0 in | Wt 140.0 lb

## 2023-02-13 DIAGNOSIS — R195 Other fecal abnormalities: Secondary | ICD-10-CM | POA: Diagnosis not present

## 2023-02-13 MED ORDER — NA SULFATE-K SULFATE-MG SULF 17.5-3.13-1.6 GM/177ML PO SOLN: 1.0000 | Freq: Once | ORAL | 0 refills | Status: AC

## 2023-02-13 NOTE — Progress Notes (Signed)
02/13/2023 Michelle Sosa [id number] August 26, 1945   HISTORY OF PRESENT ILLNESS: This is a 77 year old female who is new to our office.  She has been referred here by her PCP, Bertram Denver, NP, for positive Cologuard results.  She says that she has done the fecal immunochemistry tests in the past and it was always negative.  This is the first time she has done a Cologuard, in April, and results were positive.  She denies any GI complaints.  No family history of colon cancer.  Has never had colonoscopy in the past.  She does have hepatitis C.  Says that she found out about this in the 90s.  Was never treated.  LFT's are normal.  Last viral load was checked in January 2023.  No imaging.    Past Medical History:  Diagnosis Date   Allergy    Penicillin, sulfa, codeine   Cancer (HCC) 1986   Breast  had right mastectomy & chemo   Hepatitis C    Hyperlipidemia ?   Hypertension    Personal history of chemotherapy    Stroke [facility])    Thyroid disease ?   Hypothyroidism   Past Surgical History:  Procedure Laterality Date   BREAST SURGERY  1986   Right mastectomy   FRACTURE SURGERY     Right leg left knee left arm   KNEE SURGERY  2005   LEG SURGERY     right leg, a rod.   MASTECTOMY Right    WRIST SURGERY  2008    reports that she quit smoking about 12 years ago. Her smoking use included cigarettes. She has never used smokeless tobacco. She reports that she does not currently use alcohol. She reports that she does not use drugs. family history includes Heart disease in her father; Hyperlipidemia in her mother; Hypertension in her father; Hypothyroidism in her sister. Allergies  Allergen Reactions   Metoprolol Tartrate Other (See Comments)    Affects her mood/ lability   Cipro [Ciprofloxacin Hcl]     Joint and muscle pain   Codeine Other (See Comments)   Penicillins Other (See Comments)   Sulfa Antibiotics     States she can take Bactrim      Outpatient Encounter Medications  as of 02/13/2023  Medication Sig   amLODipine (NORVASC) 10 MG tablet Take 1 tablet (10 mg total) by mouth daily. Please fill as a 90 day supply   ASPIRIN 81 PO Take 1 tablet by mouth daily.    Calcium Carbonate-Vitamin D3 600-400 MG-UNIT TABS Take 1 tablet by mouth 2 (two) times daily.    hydrochlorothiazide (MICROZIDE) 12.5 MG capsule Take 1 capsule (12.5 mg total) by mouth daily. Please fill as a 90 day supply   irbesartan (AVAPRO) 300 MG tablet Take 1 tablet (300 mg total) by mouth daily. Please fill as a 90 day supply   Multiple Vitamins-Minerals (CENTRUM SILVER 50+WOMEN PO) Take 1 tablet by mouth daily.    Omega-3 Fatty Acids (OMEGA-3 FISH OIL PO) Take 3 tablets by mouth daily.   thiamine (VITAMIN B-1) 100 MG tablet Take 100 mg by mouth daily.   vitamin B-12 (CYANOCOBALAMIN) 100 MCG tablet Take 100 mcg by mouth daily.   No facility-administered encounter medications on file as of 02/13/2023.    REVIEW OF SYSTEMS  : All other systems reviewed and negative except where noted in the History of Present Illness.   PHYSICAL EXAM: BP 136/66   Pulse 79   Ht 5\' 3"  (  1.6 m)   Wt 140 lb (63.5 kg)   BMI 24.80 kg/m  General: Well developed white female in no acute distress Head: Normocephalic and atraumatic Eyes:  Sclerae anicteric, conjunctiva pink. Ears: Normal auditory acuity Lungs: Clear throughout to auscultation; no W/R/R. Heart: Regular rate and rhythm; no M/R/G. Abdomen: Soft, non-distended.  BS present.  Non-tender. Rectal:  Will be done at the time of colonoscopy. Musculoskeletal: Symmetrical with no gross deformities  Skin: No lesions on visible extremities. Extremities: No edema  Neurological: Alert oriented x 4, grossly non-focal Psychological:  Alert and cooperative. Normal mood and affect  ASSESSMENT AND PLAN: *Positive Cologuard: Never had a colonoscopy in the past.  Will schedule for colonoscopy with Dr. Adela Lank.  The risks, benefits, and alternatives to colonoscopy  were discussed with the patient and she consents to proceed.  *Hepatitis C: Says that she has known about since the 90s.  Has never been treated.  LFTs are normal.  Platelets are normal.  No imaging or recent hepatitis labs (last were 08/2021).  We discussed referral to infectious disease for consideration of treatment.  She says that she think about it, but wants to hold off on all of that until she gets the colonoscopy done.  She will ask her PCP for referral or call back here for [place] to place a referral when she completes her colonoscopy if she decides to proceed.   CC:  Claiborne Rigg, NP

## 2023-02-13 NOTE — Patient Instructions (Signed)
You have been scheduled for a colonoscopy. Please follow written instructions given to you at your visit today.   Please pick up your prep supplies at the pharmacy within the next 1-3 days.  If you use inhalers (even only as needed), please bring them with you on the day of your procedure.  DO NOT TAKE 7 DAYS PRIOR TO TEST- Trulicity (dulaglutide) Ozempic, Wegovy (semaglutide) Mounjaro (tirzepatide) Bydureon Bcise (exanatide extended release)  DO NOT TAKE 1 DAY PRIOR TO YOUR TEST Rybelsus (semaglutide) Adlyxin (lixisenatide) Victoza (liraglutide) Byetta (exanatide)  _______________________________________________________  If your blood pressure at your visit was 140/90 or greater, please contact your primary care physician to follow up on this.  _______________________________________________________  If you are age 75 or older, your body mass index should be between 23-30. Your Body mass index is 24.8 kg/m. If this is out of the aforementioned range listed, please consider follow up with your Primary Care Provider.  If you are age 67 or younger, your body mass index should be between 19-25. Your Body mass index is 24.8 kg/m. If this is out of the aformentioned range listed, please consider follow up with your Primary Care Provider.   ________________________________________________________  The [facility] providers would like to encourage you to use [facility] to communicate with providers for non-urgent requests or questions.  Due to long hold times on the telephone, sending your provider a message by [facility] may be a faster and more efficient way to get a response.  Please allow 48 business hours for a response.  Please remember that this is for non-urgent requests.  _______________________________________________________

## 2023-02-13 NOTE — Progress Notes (Signed)
Agree with assessment and plan as outlined. Agree with colonoscopy. Also agree with treatment for Hep C if she is willing

## 2023-02-15 ENCOUNTER — Ambulatory Visit: Payer: PPO | Admitting: Gastroenterology

## 2023-03-29 ENCOUNTER — Other Ambulatory Visit: Payer: Self-pay | Admitting: Pharmacist

## 2023-03-29 DIAGNOSIS — I10 Essential (primary) hypertension: Secondary | ICD-10-CM

## 2023-03-29 MED ORDER — IRBESARTAN 300 MG PO TABS
300.0000 mg | ORAL_TABLET | Freq: Every day | ORAL | 1 refills | Status: DC
Start: 2023-03-29 — End: 2023-07-17

## 2023-03-29 MED ORDER — HYDROCHLOROTHIAZIDE 12.5 MG PO CAPS
12.5000 mg | ORAL_CAPSULE | Freq: Every day | ORAL | 1 refills | Status: DC
Start: 2023-03-29 — End: 2023-07-17

## 2023-03-29 MED ORDER — AMLODIPINE BESYLATE 10 MG PO TABS
10.0000 mg | ORAL_TABLET | Freq: Every day | ORAL | 1 refills | Status: DC
Start: 2023-03-29 — End: 2023-07-17

## 2023-03-29 NOTE — Progress Notes (Signed)
Pharmacy Quality Measure Review  This patient is appearing on a report for being at risk of failing the adherence measure for hypertension (ACEi/ARB) medications this calendar year.   Medication: irbesartan Last fill date: 03/12/23 for 90 day supply  Called and spoke to patient. She is not in need of any refills at this time. Current antihypertensives rxns are out of refills. Sending these in for her now.   Adherence looks good. BP was at goal in May with PCP. No further work-up needed at this time.   [name], PharmD, P[place]y [place], CPP Clinical Pharmacist [facility] [phone]

## 2023-04-04 ENCOUNTER — Encounter: Payer: Self-pay | Admitting: Gastroenterology

## 2023-04-09 ENCOUNTER — Encounter: Payer: Self-pay | Admitting: Certified Registered Nurse Anesthetist

## 2023-04-11 ENCOUNTER — Ambulatory Visit: Payer: PPO | Attending: Nurse Practitioner | Admitting: Nurse Practitioner

## 2023-04-11 ENCOUNTER — Encounter: Payer: Self-pay | Admitting: Nurse Practitioner

## 2023-04-11 ENCOUNTER — Other Ambulatory Visit: Payer: Self-pay | Admitting: Nurse Practitioner

## 2023-04-11 VITALS — BP 132/62 | HR 64 | Ht 63.0 in | Wt 140.4 lb

## 2023-04-11 DIAGNOSIS — M810 Age-related osteoporosis without current pathological fracture: Secondary | ICD-10-CM

## 2023-04-11 DIAGNOSIS — I10 Essential (primary) hypertension: Secondary | ICD-10-CM

## 2023-04-11 NOTE — Progress Notes (Signed)
Assessment & Plan:  Michelle Sosa was seen today for medical management of chronic issues.  Diagnoses and all orders for this visit:  Primary hypertension Continue amlodipine and irbesartan as prescribed.  Reminded to bring in blood pressure log for follow  up appointment.  RECOMMENDATIONS: DASH/Mediterranean Diets are healthier choices for HTN.     Patient has been counseled on age-appropriate routine health concerns for screening and prevention. These are reviewed and up-to-date. Referrals have been placed accordingly. Immunizations are up-to-date or declined.    Subjective:   Chief Complaint  Patient presents with   Medical Management of Chronic Issues   HPI Michelle Sosa 77 y.o. female presents to office today for follow up to HTN  The ASCVD Risk score (Arnett DK, et al., 2019) failed to calculate for the following reasons:   The patient has a prior MI or stroke diagnosis   She has a PMH of stroke (residual weakness/uses a cane to assist with mobility), HTN, Osteoporosis, hyperlipidemia, Right Breast CA, Hepatitis C    HTN Blood pressure is slightly elevated. She states she has been stressed about her upcoming colonoscopy due to a recent positive cologuard.  She is taking amlodipine 10 mg daily, hydrochlorothiazide 25 mg daily,  and irbesartan 300 mg daily.  BP Readings from Last 3 Encounters:  04/11/23 132/62  02/13/23 136/66  01/05/23 131/71     Osteoporosis She is currently taking calcium with D3. Declines fosamax and Prolia.   Hypercholesteremia She declines statin or Zetia. Taking omega 3 fish oil 3 tablets daily.   Review of Systems  Constitutional:  Negative for fever, malaise/fatigue and weight loss.  HENT: Negative.  Negative for nosebleeds.   Eyes: Negative.  Negative for blurred vision, double vision and photophobia.  Respiratory: Negative.  Negative for cough and shortness of breath.   Cardiovascular: Negative.  Negative for chest pain, palpitations and  leg swelling.  Gastrointestinal: Negative.  Negative for heartburn, nausea and vomiting.  Musculoskeletal: Negative.  Negative for myalgias.  Neurological: Negative.  Negative for dizziness, focal weakness, seizures and headaches.  Psychiatric/Behavioral: Negative.  Negative for suicidal ideas.     Past Medical History:  Diagnosis Date   Allergy    Penicillin, sulfa, codeine   Cancer (HCC) 1986   Breast  had right mastectomy & chemo   Hepatitis C    Hyperlipidemia ?   Hypertension    Personal history of chemotherapy    Stroke [facility])    Thyroid disease ?   Hypothyroidism    Past Surgical History:  Procedure Laterality Date   BREAST SURGERY  1986   Right mastectomy   FRACTURE SURGERY     Right leg left knee left arm   KNEE SURGERY  2005   LEG SURGERY     right leg, a rod.   MASTECTOMY Right    WRIST SURGERY  2008    Family History  Problem Relation Age of Onset   Hyperlipidemia Mother    Heart disease Father    Hypertension Father    Hypothyroidism Sister    Breast cancer Neg Hx    Colon cancer Neg Hx    Stomach cancer Neg Hx    Esophageal cancer Neg Hx     Social History Reviewed with no changes to be made today.   Outpatient Medications Prior to Visit  Medication Sig Dispense Refill   amLODipine (NORVASC) 10 MG tablet Take 1 tablet (10 mg total) by mouth daily. Please fill as a 90 day supply  90 tablet 1   ASPIRIN 81 PO Take 1 tablet by mouth daily.      Calcium Carbonate-Vitamin D3 600-400 MG-UNIT TABS Take 1 tablet by mouth 2 (two) times daily.      hydrochlorothiazide (MICROZIDE) 12.5 MG capsule Take 1 capsule (12.5 mg total) by mouth daily. Please fill as a 90 day supply 90 capsule 1   irbesartan (AVAPRO) 300 MG tablet Take 1 tablet (300 mg total) by mouth daily. Please fill as a 90 day supply 90 tablet 1   Multiple Vitamins-Minerals (CENTRUM SILVER 50+WOMEN PO) Take 1 tablet by mouth daily.      Omega-3 Fatty Acids (OMEGA-3 FISH OIL PO) Take 3 tablets by  mouth daily.     thiamine (VITAMIN B-1) 100 MG tablet Take 100 mg by mouth daily.     vitamin B-12 (CYANOCOBALAMIN) 100 MCG tablet Take 100 mcg by mouth daily.     No facility-administered medications prior to visit.    Allergies  Allergen Reactions   Metoprolol Tartrate Other (See Comments)    Affects her mood/ lability   Cipro [Ciprofloxacin Hcl]     Joint and muscle pain   Codeine Other (See Comments)   Penicillins Other (See Comments)   Sulfa Antibiotics     States she can take Bactrim       Objective:    BP 132/62   Pulse 64   Ht 5\' 3"  (1.6 m)   Wt 140 lb 6.4 oz (63.7 kg)   SpO2 97%   BMI 24.87 kg/m  Wt Readings from Last 3 Encounters:  04/11/23 140 lb 6.4 oz (63.7 kg)  02/13/23 140 lb (63.5 kg)  01/05/23 141 lb 6.4 oz (64.1 kg)    Physical Exam Vitals and nursing note reviewed.  Constitutional:      Appearance: She is well-developed.  HENT:     Head: Normocephalic and atraumatic.  Cardiovascular:     Rate and Rhythm: Normal rate and regular rhythm.     Heart sounds: Normal heart sounds. No murmur heard.    No friction rub. No gallop.  Pulmonary:     Effort: Pulmonary effort is normal. No tachypnea or respiratory distress.     Breath sounds: Normal breath sounds. No decreased breath sounds, wheezing, rhonchi or rales.  Chest:     Chest wall: No tenderness.  Abdominal:     General: Bowel sounds are normal.     Palpations: Abdomen is soft.  Musculoskeletal:        General: Normal range of motion.     Cervical back: Normal range of motion.  Skin:    General: Skin is warm and dry.  Neurological:     Mental Status: She is alert and oriented to person, place, and time.     Coordination: Coordination normal.  Psychiatric:        Behavior: Behavior normal. Behavior is cooperative.        Thought Content: Thought content normal.        Judgment: Judgment normal.          Patient has been counseled extensively about nutrition and exercise as well as  the importance of adherence with medications and regular follow-up. The patient was given clear instructions to go to ER or return to medical center if symptoms don't improve, worsen or new problems develop. The patient verbalized understanding.   Follow-up: Return in about 3 months (around 07/12/2023).   Claiborne Rigg, FNP-BC [facility] [place],  [place] [phone]   04/11/2023, 12:27 PM

## 2023-04-14 ENCOUNTER — Ambulatory Visit: Payer: PPO | Admitting: Gastroenterology

## 2023-04-14 ENCOUNTER — Encounter: Payer: Self-pay | Admitting: Gastroenterology

## 2023-04-14 VITALS — BP 100/52 | HR 78 | Temp 97.8°F | Resp 12 | Ht 63.0 in | Wt 140.0 lb

## 2023-04-14 DIAGNOSIS — K635 Polyp of colon: Secondary | ICD-10-CM

## 2023-04-14 DIAGNOSIS — D125 Benign neoplasm of sigmoid colon: Secondary | ICD-10-CM

## 2023-04-14 DIAGNOSIS — Z1211 Encounter for screening for malignant neoplasm of colon: Secondary | ICD-10-CM | POA: Diagnosis not present

## 2023-04-14 DIAGNOSIS — I10 Essential (primary) hypertension: Secondary | ICD-10-CM | POA: Diagnosis not present

## 2023-04-14 DIAGNOSIS — D122 Benign neoplasm of ascending colon: Secondary | ICD-10-CM | POA: Diagnosis not present

## 2023-04-14 DIAGNOSIS — R195 Other fecal abnormalities: Secondary | ICD-10-CM | POA: Diagnosis not present

## 2023-04-14 MED ORDER — SODIUM CHLORIDE 0.9 % IV SOLN: 500.0000 mL | Freq: Once | INTRAVENOUS | Status: DC

## 2023-04-14 NOTE — Progress Notes (Signed)
Report given to PACU, vss 

## 2023-04-14 NOTE — Progress Notes (Signed)
VS by DT  Pt's states no medical or surgical changes since previsit or office visit.  

## 2023-04-14 NOTE — Op Note (Signed)
[facility] Patient Name: Michelle Sosa Procedure Date: 04/14/2023 10:31 AM MRN: [id number] Endoscopist: Viviann Spare P. Adela Lank , MD, [id number] Age: 77 Referring MD:  Date of Birth: 01/20/1946 Gender: Female Account #: [id number] Procedure:                Colonoscopy Indications:              Positive Cologuard test - first colonoscopy Medicines:                Monitored Anesthesia Care Procedure:                Pre-Anesthesia Assessment:                           - Prior to the procedure, a History and Physical                            was performed, and patient medications and                            allergies were reviewed. The patient's tolerance of                            previous anesthesia was also reviewed. The risks                            and benefits of the procedure and the sedation                            options and risks were discussed with the patient.                            All questions were answered, and informed consent                            was obtained. Prior Anticoagulants: The patient has                            taken no anticoagulant or antiplatelet agents. ASA                            Grade Assessment: III - A patient with severe                            systemic disease. After reviewing the risks and                            benefits, the patient was deemed in satisfactory                            condition to undergo the procedure.                           After obtaining informed consent, the colonoscope  was passed under direct vision. Throughout the                            procedure, the patient's blood pressure, pulse, and                            oxygen saturations were monitored continuously. The                            Olympus PCF-H190DL (#[id number]) Colonoscope was                            introduced through the anus and advanced to the the                             terminal ileum, with identification of the                            appendiceal orifice and IC valve. The colonoscopy                            was performed without difficulty. The patient                            tolerated the procedure well. The quality of the                            bowel preparation was good. The terminal ileum,                            ileocecal valve, appendiceal orifice, and rectum                            were photographed. Scope In: 10:43:39 AM Scope Out: 11:05:06 AM Scope Withdrawal Time: 0 hours 18 minutes 3 seconds  Total Procedure Duration: 0 hours 21 minutes 27 seconds  Findings:                 The perianal exam findings include some                            hypopigmentation around the perianal area, ?                            dermatitis?Marland Kitchen                           The terminal ileum appeared normal.                           A diminutive polyp was found in the ascending                            colon. The polyp was sessile. The polyp was removed  with a cold snare. Resection was complete, but the                            polyp tissue was not retrieved.                           A 3 mm polyp was found in the sigmoid colon. The                            polyp was sessile. The polyp was removed with a                            cold snare. Resection and retrieval were complete.                           A few small-mouthed diverticula were found in the                            left colon and right colon.                           Internal hemorrhoids were found during retroflexion.                           The exam was otherwise without abnormality. Complications:            No immediate complications. Estimated blood loss:                            Minimal. Estimated Blood Loss:     Estimated blood loss was minimal. Impression:               - Perianal hypopigmentation found on perianal exam                             (question history of dermatitis?).                           - The examined portion of the ileum was normal.                           - One diminutive polyp in the ascending colon,                            removed with a cold snare. Complete resection.                            Polyp tissue not retrieved.                           - One 3 mm polyp in the sigmoid colon, removed with                            a cold snare. Resected and retrieved.                           -  Diverticulosis in the left colon and in the right                            colon.                           - Internal hemorrhoids.                           - The examination was otherwise normal.                           Overall no high risk pathology on this exam. Likely                            no further surveillance exams are needed given the                            patient's age. Recommendation:           - Patient has a contact number available for                            emergencies. The signs and symptoms of potential                            delayed complications were discussed with the                            patient. Return to normal activities tomorrow.                            Written discharge instructions were provided to the                            patient.                           - Resume previous diet.                           - Continue present medications.                           - Await pathology results.                           - Follow up with PCP about possible perianal                            dermatitis if you have not done so already [employer]. Adela Lank, MD 04/14/2023 11:13:42 AM This report has been signed electronically.

## 2023-04-14 NOTE — Progress Notes (Signed)
1050 Ephedrine 10 mg given IV due to low BP, MD updated.

## 2023-04-14 NOTE — Progress Notes (Signed)
1055 Ephedrine 10 mg given IV due to low BP, MD updated.

## 2023-04-14 NOTE — Progress Notes (Signed)
Called to room to assist during endoscopic procedure.  Patient ID and intended procedure confirmed with present staff. Received instructions for my participation in the procedure from the performing physician.  

## 2023-04-14 NOTE — Patient Instructions (Signed)
Resume previous diet and medications. Awaiting pathology results. Follow up with PCP regarding possible per-anal dermatitis if you have not already done so. Handouts provided on Colon polyps, Diverticulosis and Hemorrhoids  YOU HAD AN ENDOSCOPIC PROCEDURE TODAY AT THE [facility]:   Refer to the procedure report that was given to you for any specific questions about what was found during the examination.  If the procedure report does not answer your questions, please call your gastroenterologist to clarify.  If you requested that your care partner not be given the details of your procedure findings, then the procedure report has been included in a sealed envelope for you to review at your convenience later.  YOU SHOULD EXPECT: Some feelings of bloating in the abdomen. Passage of more gas than usual.  Walking can help get rid of the air that was put into your GI tract during the procedure and reduce the bloating. If you had a lower endoscopy (such as a colonoscopy or flexible sigmoidoscopy) you may notice spotting of blood in your stool or on the toilet paper. If you underwent a bowel prep for your procedure, you may not have a normal bowel movement for a few days.  Please Note:  You might notice some irritation and congestion in your nose or some drainage.  This is from the oxygen used during your procedure.  There is no need for concern and it should clear up in a day or so.  SYMPTOMS TO REPORT IMMEDIATELY:  Following lower endoscopy (colonoscopy or flexible sigmoidoscopy):  Excessive amounts of blood in the stool  Significant tenderness or worsening of abdominal pains  Swelling of the abdomen that is new, acute  Fever of 100F or higher  For urgent or emergent issues, a gastroenterologist can be reached at any hour by calling [phone]. Do not use MyChart messaging for urgent concerns.    DIET:  We do recommend a small meal at first, but then you may proceed to your regular  diet.  Drink plenty of fluids but you should avoid alcoholic beverages for 24 hours.  ACTIVITY:  You should plan to take it easy for the rest of today and you should NOT DRIVE or use heavy machinery until tomorrow (because of the sedation medicines used during the test).    FOLLOW UP: Our staff will call the number listed on your records the next business day following your procedure.  We will call around 7:15- 8:00 am to check on you and address any questions or concerns that you may have regarding the information given to you following your procedure. If we do not reach you, we will leave a message.     If any biopsies were taken you will be contacted by phone or by letter within the next 1-3 weeks.  Please call us at [phone] if you have not heard about the biopsies in 3 weeks.    SIGNATURES/CONFIDENTIALITY: You and/or your care partner have signed paperwork which will be entered into your electronic medical record.  These signatures attest to the fact that that the information above on your After Visit Summary has been reviewed and is understood.  Full responsibility of the confidentiality of this discharge information lies with you and/or your care-partner.

## 2023-04-14 NOTE — Progress Notes (Signed)
Gastroenterology History and Physical   Primary Care Physician:  Claiborne Rigg, NP   Reason for Procedure:   Positive cologuard  Plan:    colonoscopy     HPI: Michelle Sosa is a 77 y.o. female  here for colonoscopy - she had a positive Cologuard. No prior colonoscopy.   . Patient denies any bowel symptoms at this time. No family history of colon cancer known. Otherwise feels well without any cardiopulmonary symptoms.   I have discussed risks / benefits of anesthesia and endoscopic procedure with [name] and they wish to proceed with the exams as outlined today.    Past Medical History:  Diagnosis Date   Allergy    Penicillin, sulfa, codeine   Cancer (HCC) 1986   Breast  had right mastectomy & chemo   Hepatitis C    Hyperlipidemia ?   Hypertension    Personal history of chemotherapy    Stroke [facility])    Thyroid disease ?   Hypothyroidism    Past Surgical History:  Procedure Laterality Date   BREAST SURGERY  1986   Right mastectomy   FRACTURE SURGERY     Right leg left knee left arm   KNEE SURGERY  2005   LEG SURGERY     right leg, a rod.   MASTECTOMY Right    WRIST SURGERY  2008    Prior to Admission medications   Medication Sig Start Date End Date Taking? Authorizing Provider  amLODipine (NORVASC) 10 MG tablet Take 1 tablet (10 mg total) by mouth daily. Please fill as a 90 day supply 03/29/23  Yes Newlin, Enobong, MD  ASPIRIN 81 PO Take 1 tablet by mouth daily.    Yes [provider]  Calcium Carbonate-Vitamin D3 600-400 MG-UNIT TABS Take 1 tablet by mouth 2 (two) times daily.    Yes [provider]  hydrochlorothiazide (MICROZIDE) 12.5 MG capsule Take 1 capsule (12.5 mg total) by mouth daily. Please fill as a 90 day supply 03/29/23  Yes Newlin, Enobong, MD  irbesartan (AVAPRO) 300 MG tablet Take 1 tablet (300 mg total) by mouth daily. Please fill as a 90 day supply 03/29/23  Yes Newlin, Enobong, MD  Multiple  Vitamins-Minerals (CENTRUM SILVER 50+WOMEN PO) Take 1 tablet by mouth daily.    Yes [provider]  Omega-3 Fatty Acids (OMEGA-3 FISH OIL PO) Take 3 tablets by mouth daily.   Yes [provider]  thiamine (VITAMIN B-1) 100 MG tablet Take 100 mg by mouth daily.   Yes [provider]  vitamin B-12 (CYANOCOBALAMIN) 100 MCG tablet Take 100 mcg by mouth daily.   Yes [provider]    Current Outpatient Medications  Medication Sig Dispense Refill   amLODipine (NORVASC) 10 MG tablet Take 1 tablet (10 mg total) by mouth daily. Please fill as a 90 day supply 90 tablet 1   ASPIRIN 81 PO Take 1 tablet by mouth daily.      Calcium Carbonate-Vitamin D3 600-400 MG-UNIT TABS Take 1 tablet by mouth 2 (two) times daily.      hydrochlorothiazide (MICROZIDE) 12.5 MG capsule Take 1 capsule (12.5 mg total) by mouth daily. Please fill as a 90 day supply 90 capsule 1   irbesartan (AVAPRO) 300 MG tablet Take 1 tablet (300 mg total) by mouth daily. Please fill as a 90 day supply 90 tablet 1   Multiple Vitamins-Minerals (CENTRUM SILVER 50+WOMEN PO) Take 1 tablet by mouth daily.  Omega-3 Fatty Acids (OMEGA-3 FISH OIL PO) Take 3 tablets by mouth daily.     thiamine (VITAMIN B-1) 100 MG tablet Take 100 mg by mouth daily.     vitamin B-12 (CYANOCOBALAMIN) 100 MCG tablet Take 100 mcg by mouth daily.     Current Facility-Administered Medications  Medication Dose Route Frequency Provider Last Rate Last Admin   0.9 %  sodium chloride infusion  500 mL Intravenous Once Zyion Leidner, Willaim Rayas, MD        Allergies as of 04/14/2023 - Review Complete 04/14/2023  Allergen Reaction Noted   Metoprolol tartrate Other (See Comments) 10/05/2019   Cipro [ciprofloxacin hcl]  10/05/2019   Codeine Other (See Comments) 10/25/2012   Penicillins Other (See Comments) 10/25/2012   Sulfa antibiotics  10/25/2012    Family History  Problem Relation Age of Onset   Hyperlipidemia Mother    Heart  disease Father    Hypertension Father    Hypothyroidism Sister    Breast cancer Neg Hx    Colon cancer Neg Hx    Stomach cancer Neg Hx    Esophageal cancer Neg Hx    Rectal cancer Neg Hx     Social History   Socioeconomic History   Marital status: Single    Spouse name: Not on file   Number of children: 0   Years of education: Not on file   Highest education level: Some college, no degree  Occupational History   Occupation: retired  Tobacco Use   Smoking status: Former    Current packs/day: 0.00    Types: Cigarettes    Quit date: 02/14/2011    Years since quitting: 12.1   Smokeless tobacco: Never  Vaping Use   Vaping status: Never Used  Substance and Sexual Activity   Alcohol use: Not Currently   Drug use: No   Sexual activity: Not Currently  Other Topics Concern   Not on file  Social History Narrative   Not on file   Social Determinants of Health   Financial Resource Strain: Low Risk  (01/03/2023)   Overall Financial Resource Strain (CARDIA)    Difficulty of Paying Living Expenses: Not hard at all  Food Insecurity: No Food Insecurity (01/03/2023)   Hunger Vital Sign    Worried About Running Out of Food in the Last Year: Never true    Ran Out of Food in the Last Year: Never true  Transportation Needs: No Transportation Needs (01/03/2023)   PRAPARE - Administrator, Civil Service (Medical): No    Lack of Transportation (Non-Medical): No  Physical Activity: Insufficiently Active (01/03/2023)   Exercise Vital Sign    Days of Exercise per Week: 1 day    Minutes of Exercise per Session: 10 min  Stress: No Stress Concern Present (01/03/2023)   [employer] of Occupational Health - Occupational Stress Questionnaire    Feeling of Stress : Only a little  Social Connections: Unknown (01/03/2023)   Social Connection and Isolation Panel [NHANES]    Frequency of Communication with Friends and Family: Three times a week    Frequency of Social Gatherings with  Friends and Family: Once a week    Attends Religious Services: Patient declined    Database administrator or Organizations: No    Attends Banker Meetings: Never    Marital Status: Never married  Intimate Partner Violence: Not At Risk (11/04/2022)   Humiliation, Afraid, Rape, and Kick questionnaire    Fear of Current or Ex-Partner:  No    Emotionally Abused: No    Physically Abused: No    Sexually Abused: No    Review of Systems: All other review of systems negative except as mentioned in the HPI.  Physical Exam: Vital signs BP (!) 149/78   Pulse 81   Temp 97.8 F (36.6 C)   Ht 5\' 3"  (1.6 m)   Wt 140 lb (63.5 kg)   SpO2 95%   BMI 24.80 kg/m   General:   Alert,  Well-developed, pleasant and cooperative in NAD Lungs:  Clear throughout to auscultation.   Heart:  Regular rate and rhythm Abdomen:  Soft, nontender and nondistended.   Neuro/Psych:  Alert and cooperative. Normal mood and affect. A and O x 3  Harlin Rain, MD [facility]

## 2023-04-14 NOTE — Progress Notes (Signed)
1045 Ephedrine 10 mg given IV due to low BP, MD updated.

## 2023-04-18 ENCOUNTER — Ambulatory Visit
Admission: RE | Admit: 2023-04-18 | Discharge: 2023-04-18 | Disposition: A | Discharge status: Home / Self Care | Payer: PPO | Source: Ambulatory Visit | Attending: Nurse Practitioner | Admitting: Nurse Practitioner

## 2023-04-18 ENCOUNTER — Telehealth: Payer: Self-pay

## 2023-04-18 DIAGNOSIS — Z1231 Encounter for screening mammogram for malignant neoplasm of breast: Secondary | ICD-10-CM | POA: Diagnosis not present

## 2023-04-18 NOTE — Telephone Encounter (Signed)
Left message on follow up call. 

## 2023-07-17 ENCOUNTER — Ambulatory Visit: Payer: PPO | Attending: Nurse Practitioner | Admitting: Nurse Practitioner

## 2023-07-17 ENCOUNTER — Encounter: Payer: Self-pay | Admitting: Nurse Practitioner

## 2023-07-17 VITALS — BP 131/75 | HR 65 | Ht 63.0 in | Wt 141.6 lb

## 2023-07-17 DIAGNOSIS — I10 Essential (primary) hypertension: Secondary | ICD-10-CM | POA: Diagnosis not present

## 2023-07-17 DIAGNOSIS — T466X5A Adverse effect of antihyperlipidemic and antiarteriosclerotic drugs, initial encounter: Secondary | ICD-10-CM

## 2023-07-17 DIAGNOSIS — G720 Drug-induced myopathy: Secondary | ICD-10-CM

## 2023-07-17 DIAGNOSIS — E038 Other specified hypothyroidism: Secondary | ICD-10-CM | POA: Diagnosis not present

## 2023-07-17 MED ORDER — AMLODIPINE BESYLATE 10 MG PO TABS
10.0000 mg | ORAL_TABLET | Freq: Every day | ORAL | 1 refills | Status: DC
Start: 2023-07-17 — End: 2023-10-16

## 2023-07-17 MED ORDER — IRBESARTAN 300 MG PO TABS
300.0000 mg | ORAL_TABLET | Freq: Every day | ORAL | 1 refills | Status: DC
Start: 2023-07-17 — End: 2023-10-16

## 2023-07-17 MED ORDER — HYDROCHLOROTHIAZIDE 12.5 MG PO CAPS
12.5000 mg | ORAL_CAPSULE | Freq: Every day | ORAL | 1 refills | Status: DC
Start: 2023-07-17 — End: 2023-10-16

## 2023-07-17 NOTE — Progress Notes (Signed)
Assessment & Plan:  Michelle Sosa was seen today for medical management of chronic issues.  Diagnoses and all orders for this visit:  Primary hypertension -     amLODipine (NORVASC) 10 MG tablet; Take 1 tablet (10 mg total) by mouth daily. Please fill as a 90 day supply -     hydrochlorothiazide (MICROZIDE) 12.5 MG capsule; Take 1 capsule (12.5 mg total) by mouth daily. Please fill as a 90 day supply -     irbesartan (AVAPRO) 300 MG tablet; Take 1 tablet (300 mg total) by mouth daily. Please fill as a 90 day supply -     CMP14+EGFR  Subclinical hypothyroidism -     TSH -     T4, Free -     T3, Free  Statin myopathy    Patient has been counseled on age-appropriate routine health concerns for screening and prevention. These are reviewed and up-to-date. Referrals have been placed accordingly. Immunizations are up-to-date or declined.    Subjective:   Chief Complaint  Patient presents with   Medical Management of Chronic Issues    Michelle Sosa 77 y.o. female presents to office today for follow-up to hypertension.  Patient has been counseled on age-appropriate routine health concerns for screening and prevention. These are reviewed and up-to-date. Referrals have been placed accordingly. Immunizations are up-to-date or declined.     Mammogram: Up-to-date Colon cancer screening: Up-to-date Bone density: Up-to-date.  Due next year    She has a PMH of stroke (residual weakness/uses a cane to assist with mobility), HTN, Osteoporosis (declines Fosamax and Prolia.  Taking maximum dose of calcium and vitamin D at this time), hyperlipidemia, Right Breast CA, Hepatitis C      Blood pressure is well-controlled today.  She is taking amlodipine 10 mg daily, hydrochlorothiazide 12.5 mg daily and irbesartan 300 mg daily as prescribed. BP Readings from Last 3 Encounters:  07/17/23 131/75  04/14/23 (!) 100/52  04/11/23 132/62     Review of Systems  Constitutional:  Negative for fever,  malaise/fatigue and weight loss.  HENT: Negative.  Negative for nosebleeds.   Eyes: Negative.  Negative for blurred vision, double vision and photophobia.  Respiratory: Negative.  Negative for cough and shortness of breath.   Cardiovascular: Negative.  Negative for chest pain, palpitations and leg swelling.  Gastrointestinal: Negative.  Negative for heartburn, nausea and vomiting.  Musculoskeletal: Negative.  Negative for myalgias.  Neurological: Negative.  Negative for dizziness, focal weakness, seizures and headaches.  Psychiatric/Behavioral: Negative.  Negative for suicidal ideas.     Past Medical History:  Diagnosis Date   Allergy    Penicillin, sulfa, codeine   Cancer (HCC) 1986   Breast  had right mastectomy & chemo   Hepatitis C    Hyperlipidemia ?   Hypertension    Personal history of chemotherapy    Stroke [facility])    Thyroid disease ?   Hypothyroidism    Past Surgical History:  Procedure Laterality Date   BREAST SURGERY  1986   Right mastectomy   FRACTURE SURGERY     Right leg left knee left arm   KNEE SURGERY  2005   LEG SURGERY     right leg, a rod.   MASTECTOMY Right    WRIST SURGERY  2008    Family History  Problem Relation Age of Onset   Hyperlipidemia Mother    Heart disease Father    Hypertension Father    Hypothyroidism Sister    Breast cancer Neg  Hx    Colon cancer Neg Hx    Stomach cancer Neg Hx    Esophageal cancer Neg Hx    Rectal cancer Neg Hx     Social History Reviewed with no changes to be made today.   Outpatient Medications Prior to Visit  Medication Sig Dispense Refill   ASPIRIN 81 PO Take 1 tablet by mouth daily.      Calcium Carbonate-Vitamin D3 600-400 MG-UNIT TABS Take 1 tablet by mouth 2 (two) times daily.      Multiple Vitamins-Minerals (CENTRUM SILVER 50+WOMEN PO) Take 1 tablet by mouth daily.      Omega-3 Fatty Acids (OMEGA-3 FISH OIL PO) Take 3 tablets by mouth daily.     thiamine (VITAMIN B-1) 100 MG tablet Take 100 mg by  mouth daily.     vitamin B-12 (CYANOCOBALAMIN) 100 MCG tablet Take 100 mcg by mouth daily.     amLODipine (NORVASC) 10 MG tablet Take 1 tablet (10 mg total) by mouth daily. Please fill as a 90 day supply 90 tablet 1   hydrochlorothiazide (MICROZIDE) 12.5 MG capsule Take 1 capsule (12.5 mg total) by mouth daily. Please fill as a 90 day supply 90 capsule 1   irbesartan (AVAPRO) 300 MG tablet Take 1 tablet (300 mg total) by mouth daily. Please fill as a 90 day supply 90 tablet 1   No facility-administered medications prior to visit.    Allergies  Allergen Reactions   Metoprolol Tartrate Other (See Comments)    Affects her mood/ lability   Cipro [Ciprofloxacin Hcl]     Joint and muscle pain   Codeine Other (See Comments)   Penicillins Other (See Comments)   Sulfa Antibiotics     States she can take Bactrim       Objective:    BP 131/75 (BP Location: Left Arm, Patient Position: Sitting, Cuff Size: Normal)   Pulse 65   Ht 5\' 3"  (1.6 m)   Wt 141 lb 9.6 oz (64.2 kg)   SpO2 96%   BMI 25.08 kg/m  Wt Readings from Last 3 Encounters:  07/17/23 141 lb 9.6 oz (64.2 kg)  04/14/23 140 lb (63.5 kg)  04/11/23 140 lb 6.4 oz (63.7 kg)    Physical Exam Vitals and nursing note reviewed.  Constitutional:      Appearance: She is well-developed.  HENT:     Head: Normocephalic and atraumatic.  Cardiovascular:     Rate and Rhythm: Normal rate and regular rhythm.     Heart sounds: Normal heart sounds. No murmur heard.    No friction rub. No gallop.  Pulmonary:     Effort: Pulmonary effort is normal. No tachypnea or respiratory distress.     Breath sounds: Normal breath sounds. No decreased breath sounds, wheezing, rhonchi or rales.  Chest:     Chest wall: No tenderness.  Abdominal:     General: Bowel sounds are normal.     Palpations: Abdomen is soft.  Musculoskeletal:        General: Normal range of motion.     Cervical back: Normal range of motion.  Skin:    General: Skin is warm and  dry.  Neurological:     Mental Status: She is alert and oriented to person, place, and time.     Coordination: Coordination normal.  Psychiatric:        Behavior: Behavior normal. Behavior is cooperative.        Thought Content: Thought content normal.  Judgment: Judgment normal.          Patient has been counseled extensively about nutrition and exercise as well as the importance of adherence with medications and regular follow-up. The patient was given clear instructions to go to ER or return to medical center if symptoms don't improve, worsen or new problems develop. The patient verbalized understanding.   Follow-up: Return in about 3 months (around 10/20/2023) for htn.   Claiborne Rigg, FNP-BC [facility] [place], [place] [phone]   07/17/2023, 10:13 AM

## 2023-07-18 LAB — CMP14+EGFR
ALT: 22 [IU]/L (ref 0–32)
AST: 27 [IU]/L (ref 0–40)
Albumin: 4.4 g/dL (ref 3.8–4.8)
Alkaline Phosphatase: 80 [IU]/L (ref 44–121)
BUN/Creatinine Ratio: 23 (ref 12–28)
BUN: 16 mg/dL (ref 8–27)
Bilirubin Total: 0.3 mg/dL (ref 0.0–1.2)
CO2: 23 mmol/L (ref 20–29)
Calcium: 10.3 mg/dL (ref 8.7–10.3)
Chloride: 99 mmol/L (ref 96–106)
Creatinine, Ser: 0.71 mg/dL (ref 0.57–1.00)
Globulin, Total: 3 g/dL (ref 1.5–4.5)
Glucose: 99 mg/dL (ref 70–99)
Potassium: 4.5 mmol/L (ref 3.5–5.2)
Sodium: 140 mmol/L (ref 134–144)
Total Protein: 7.4 g/dL (ref 6.0–8.5)
eGFR: 89 mL/min/{1.73_m2} (ref >59)

## 2023-07-18 LAB — T3, FREE: T3, Free: 2.9 pg/mL (ref 2.0–4.4)

## 2023-07-18 LAB — TSH: TSH: 4.85 u[IU]/mL — ABNORMAL HIGH (ref 0.450–4.500)

## 2023-07-18 LAB — T4, FREE: Free T4: 1.32 ng/dL (ref 0.82–1.77)

## 2023-07-20 DIAGNOSIS — C50911 Malignant neoplasm of unspecified site of right female breast: Secondary | ICD-10-CM | POA: Diagnosis not present

## 2023-10-10 ENCOUNTER — Ambulatory Visit: Payer: PPO | Attending: Family Medicine

## 2023-10-10 VITALS — BP 122/60 | HR 64 | Temp 97.6°F | Resp 16 | Ht 63.0 in | Wt 144.0 lb

## 2023-10-10 DIAGNOSIS — Z0000 Encounter for general adult medical examination without abnormal findings: Secondary | ICD-10-CM

## 2023-10-10 NOTE — Progress Notes (Signed)
 Subjective:   Michelle Sosa is a 78 y.o. who presents for a Medicare Wellness preventive visit.  Visit Complete: In person  VideoDeclined- This patient declined Interactive audio and Acupuncturist. Therefore the visit was completed with audio only.  AWV Questionnaire: Yes: Patient Medicare AWV questionnaire was completed by the patient on 10/03/2023; I have confirmed that all information answered by patient is correct and no changes since this date.  Cardiac Risk Factors include: advanced age (>54men, >56 women)     Objective:    Today's Vitals   10/10/23 0955  BP: 122/60  Pulse: 64  Resp: 16  Temp: 97.6 F (36.4 C)  TempSrc: Temporal  SpO2: 97%  Weight: 144 lb (65.3 kg)  Height: 5\' 3"  (1.6 m)  PainSc: 0-No pain   Body mass index is 25.51 kg/m.     10/10/2023   10:11 AM 11/04/2022    9:33 AM 10/27/2021   10:09 AM 04/24/2017    6:00 PM 04/24/2017   10:16 AM 01/18/2017   11:12 AM 07/14/2016   12:33 PM  Advanced Directives  Does Patient Have a Medical Advance Directive? No No No No  No No  Would patient like information on creating a medical advance directive? No - Patient declined Yes (ED - Information included in AVS) No - Patient declined  No - Patient declined      Current Medications (verified) Outpatient Encounter Medications as of 10/10/2023  Medication Sig   amLODipine (NORVASC) 10 MG tablet Take 1 tablet (10 mg total) by mouth daily. Please fill as a 90 day supply   ASPIRIN 81 PO Take 1 tablet by mouth daily.    Calcium Carbonate-Vitamin D3 600-400 MG-UNIT TABS Take 1 tablet by mouth 2 (two) times daily.    hydrochlorothiazide (MICROZIDE) 12.5 MG capsule Take 1 capsule (12.5 mg total) by mouth daily. Please fill as a 90 day supply   irbesartan (AVAPRO) 300 MG tablet Take 1 tablet (300 mg total) by mouth daily. Please fill as a 90 day supply   Multiple Vitamins-Minerals (CENTRUM SILVER 50+WOMEN PO) Take 1 tablet by mouth daily.    Omega-3 Fatty  Acids (OMEGA-3 FISH OIL PO) Take 3 tablets by mouth daily.   thiamine (VITAMIN B-1) 100 MG tablet Take 100 mg by mouth daily.   vitamin B-12 (CYANOCOBALAMIN) 100 MCG tablet Take 100 mcg by mouth daily.   No facility-administered encounter medications on file as of 10/10/2023.    Allergies (verified) Metoprolol tartrate, Cipro [ciprofloxacin hcl], Codeine, Penicillins, and Sulfa antibiotics   History: Past Medical History:  Diagnosis Date   Allergy    Penicillin, sulfa, codeine   Cancer (HCC) 1986   Breast  had right mastectomy & chemo   Hepatitis C    Hyperlipidemia ?   Hypertension    Personal history of chemotherapy    Stroke [facility])    Thyroid disease ?   Hypothyroidism   Past Surgical History:  Procedure Laterality Date   BREAST SURGERY  1986   Right mastectomy   FRACTURE SURGERY     Right leg left knee left arm   KNEE SURGERY  2005   LEG SURGERY     right leg, a rod.   MASTECTOMY Right    WRIST SURGERY  2008   Family History  Problem Relation Age of Onset   Hyperlipidemia Mother    Heart disease Father    Hypertension Father    Hypothyroidism Sister    Breast cancer Neg Hx  Colon cancer Neg Hx    Stomach cancer Neg Hx    Esophageal cancer Neg Hx    Rectal cancer Neg Hx    Social History   Socioeconomic History   Marital status: Single    Spouse name: Not on file   Number of children: 0   Years of education: Not on file   Highest education level: Some college, no degree  Occupational History   Occupation: retired  Tobacco Use   Smoking status: Former    Current packs/day: 0.00    Types: Cigarettes    Quit date: 02/14/2011    Years since quitting: 12.6   Smokeless tobacco: Never  Vaping Use   Vaping status: Never Used  Substance and Sexual Activity   Alcohol use: Not Currently   Drug use: No   Sexual activity: Not Currently  Other Topics Concern   Not on file  Social History Narrative   Not on file   Social Drivers of Health   Financial  Resource Strain: Low Risk  (10/10/2023)   Overall Financial Resource Strain (CARDIA)    Difficulty of Paying Living Expenses: Not hard at all  Food Insecurity: No Food Insecurity (10/10/2023)   Hunger Vital Sign    Worried About Running Out of Food in the Last Year: Never true    Ran Out of Food in the Last Year: Never true  Transportation Needs: No Transportation Needs (10/10/2023)   PRAPARE - Administrator, Civil Service (Medical): No    Lack of Transportation (Non-Medical): No  Physical Activity: Inactive (10/10/2023)   Exercise Vital Sign    Days of Exercise per Week: 0 days    Minutes of Exercise per Session: 0 min  Stress: No Stress Concern Present (10/10/2023)   [employer] of Occupational Health - Occupational Stress Questionnaire    Feeling of Stress : Not at all  Social Connections: Moderately Integrated (10/10/2023)   Social Connection and Isolation Panel [NHANES]    Frequency of Communication with Friends and Family: Three times a week    Frequency of Social Gatherings with Friends and Family: Once a week    Attends Religious Services: 1 to 4 times per year    Active Member of [employer] or Organizations: No    Attends Banker Meetings: 1 to 4 times per year    Marital Status: Never married  Recent Concern: Social Connections - Moderately Isolated (10/03/2023)   Social Connection and Isolation Panel [NHANES]    Frequency of Communication with Friends and Family: Three times a week    Frequency of Social Gatherings with Friends and Family: Once a week    Attends Religious Services: 1 to 4 times per year    Active Member of [employer] or Organizations: No    Attends Engineer, structural: Never    Marital Status: Never married    Tobacco Counseling Counseling given: Not Answered    Clinical Intake:  Pre-visit preparation completed: Yes  Pain : No/denies pain Pain Score: 0-No pain     BMI - recorded: 25.51 Nutritional Status: BMI 25  -29 Overweight Nutritional Risks: None Diabetes: No  How often do you need to have someone help you when you read instructions, pamphlets, or other written materials from your doctor or pharmacy?: 1 - Never What is the last grade level you completed in school?: College Graduate  Interpreter Needed?: No  Information entered by :: Marcene Laskowski N. Jerriah Ines, LPN.   Activities of Daily Living  10/10/2023   10:13 AM 10/03/2023    9:13 AM  In your present state of health, do you have any difficulty performing the following activities:  Hearing? 0 0  Vision? 0 0  Difficulty concentrating or making decisions? 0 0  Walking or climbing stairs? 1 1  Dressing or bathing? 0 0  Doing errands, shopping? 0 0  Preparing Food and eating ? N N  Using the Toilet? N N  In the past six months, have you accidently leaked urine? N N  Do you have problems with loss of bowel control? N N  Managing your Medications? N N  Managing your Finances? N N  Housekeeping or managing your Housekeeping? N N    Patient Care Team: Claiborne Rigg, NP as PCP - General (Nurse Practitioner)  Indicate any recent Medical Services you may have received from other than [facility] providers in the past year (date may be approximate).     Assessment:   This is a routine wellness examination for Maday.  Hearing/Vision screen Hearing Screening - Comments:: Denies hearing difficulties. No hearing aids.   Vision Screening - Comments:: Wears reading glasses - not to date with routine eye exams.  Patient stated that she will schedule.   Goals Addressed             This Visit's Progress    My goal is to be more mobile and feel safe to walk outside.         Depression Screen     10/10/2023   10:08 AM 07/17/2023    8:46 AM 04/11/2023    8:46 AM 01/05/2023    8:40 AM 11/04/2022    9:33 AM 09/28/2022    8:44 AM 06/28/2022    9:09 AM  PHQ 2/9 Scores  PHQ - 2 Score 0 0 0 0 0 0 0  PHQ- 9 Score 0   0  0 0    Fall Risk      10/10/2023   10:12 AM 10/03/2023    9:13 AM 07/17/2023    8:46 AM 04/11/2023    8:46 AM 01/05/2023    8:39 AM  Fall Risk   Falls in the past year? 0 0 0 0 0  Number falls in past yr: 0  0 0 0  Injury with Fall? 0  0 0 0  Risk for fall due to : No Fall Risks  No Fall Risks No Fall Risks No Fall Risks  Follow up Falls prevention discussed;Falls evaluation completed  Falls evaluation completed Falls evaluation completed Falls evaluation completed    MEDICARE RISK AT HOME:  Medicare Risk at Home Any stairs in or around the home?: No If so, are there any without handrails?: No Home free of loose throw rugs in walkways, pet beds, electrical cords, etc?: Yes Adequate lighting in your home to reduce risk of falls?: Yes Life alert?: No Use of a cane, walker or w/c?: Yes Grab bars in the bathroom?: Yes Shower chair or bench in shower?: Yes Elevated toilet seat or a handicapped toilet?: No  TIMED UP AND GO:  Was the test performed?  Yes  Length of time to ambulate 10 feet: 8 sec Gait steady and fast with assistive device  Cognitive Function: 6CIT completed    10/10/2023   10:13 AM 11/04/2022    9:34 AM 10/27/2021   10:10 AM  MMSE - Mini Mental State Exam  Orientation to time 5 5 5   Orientation to Place 5 5 5  Registration 3 3 3   Attention/ Calculation 5 5 5   Recall 3 3 3   Language- name 2 objects 2 2 2   Language- repeat 1 1 1   Language- follow 3 step command 3 3 3   Language- read & follow direction 1 1 1   Write a sentence 1 1 1   Copy design 1 1 1   Total score 30 30 30         10/10/2023   10:14 AM 11/04/2022    9:35 AM  6CIT Screen  What Year? 0 points 0 points  What month? 0 points 0 points  What time? 0 points 0 points  Count back from 20 0 points 0 points  Months in reverse 0 points 0 points  Repeat phrase 0 points 0 points  Total Score 0 points 0 points    Immunizations Immunization History  Administered Date(s) Administered   Fluad Quad(high Dose 65+) 05/09/2019,  05/18/2022   Influenza, High Dose Seasonal PF 05/10/2018   Influenza,inj,Quad PF,6+ Mos 04/24/2017   Influenza-Unspecified 05/22/2020, 06/10/2021, 06/10/2023   PFIZER(Purple Top)SARS-COV-2 Vaccination 09/23/2019, 10/18/2019, 05/30/2020   Pneumococcal Conjugate-13 06/20/2013   Pneumococcal Polysaccharide-23 05/08/2015   Tdap 10/20/2021   Tetanus 10/20/2021    Screening Tests Health Maintenance  Topic Date Due   COVID-19 Vaccine (4 - 2024-25 season) 04/16/2023   COLON CANCER SCREENING ANNUAL FOBT  04/13/2024   Medicare Annual Wellness (AWV)  10/09/2024   DTaP/Tdap/Td (2 - Td or Tdap) 10/21/2031   Colonoscopy  04/13/2033   Pneumonia Vaccine 89+ Years old  Completed   INFLUENZA VACCINE  Completed   DEXA SCAN  Completed   Hepatitis C Screening  Completed   HPV VACCINES  Aged Out   Zoster Vaccines- Shingrix  Discontinued    Health Maintenance  Health Maintenance Due  Topic Date Due   COVID-19 Vaccine (4 - 2024-25 season) 04/16/2023   Health Maintenance Items Addressed: Yes Patient declined Covid-19 Vaccine.  Additional Screening:  Vision Screening: Recommended annual ophthalmology exams for early detection of glaucoma and other disorders of the eye.  Dental Screening: Recommended annual dental exams for proper oral hygiene  Community Resource Referral / Chronic Care Management: CRR required this visit?  No   CCM required this visit?  No     Plan:     I have personally reviewed and noted the following in the patient's chart:   Medical and social history Use of alcohol, tobacco or illicit drugs  Current medications and supplements including opioid prescriptions. Patient is not currently taking opioid prescriptions. Functional ability and status Nutritional status Physical activity Advanced directives List of other physicians Hospitalizations, surgeries, and ER visits in previous 12 months Vitals Screenings to include cognitive, depression, and falls Referrals  and appointments  In addition, I have reviewed and discussed with patient certain preventive protocols, quality metrics, and best practice recommendations. A written personalized care plan for preventive services as well as general preventive health recommendations were provided to patient.     Mickeal Needy, LPN   1/61/0960   After Visit Summary: (MyChart) Due to this being a telephonic visit, the after visit summary with patients personalized plan was offered to patient via MyChart   Notes: Nothing significant to report at this time.

## 2023-10-10 NOTE — Patient Instructions (Addendum)
 Michelle Sosa , Thank you for taking time to come for your Medicare Wellness Visit. I appreciate your ongoing commitment to your health goals. Please review the following plan we discussed and let me know if I can assist you in the future.   Referrals/Orders/Follow-Ups/Clinician Recommendations: Yes; Keep maintaining your health by keeping your appointments with Shon Hale, NP and any specialists that you may see.  Call us if you need anything.  Have a great year!!!!  This is a list of the screening recommended for you and due dates:  Health Maintenance  Topic Date Due   COVID-19 Vaccine (4 - 2024-25 season) 04/16/2023   Medicare Annual Wellness Visit  11/04/2023   Stool Blood Test  04/13/2024   DTaP/Tdap/Td vaccine (2 - Td or Tdap) 10/21/2031   Colon Cancer Screening  04/13/2033   Pneumonia Vaccine  Completed   Flu Shot  Completed   DEXA scan (bone density measurement)  Completed   Hepatitis C Screening  Completed   HPV Vaccine  Aged Out   Zoster (Shingles) Vaccine  Discontinued    Advanced directives: (Declined) Advance directive discussed with you today. Even though you declined this today, please call our office should you change your mind, and we can give you the proper paperwork for you to fill out.  Next Medicare Annual Wellness Visit scheduled for next year: No; Patient stated that she will wait to schedule.

## 2023-10-16 ENCOUNTER — Encounter: Payer: Self-pay | Admitting: Nurse Practitioner

## 2023-10-16 ENCOUNTER — Ambulatory Visit: Payer: PPO | Attending: Nurse Practitioner | Admitting: Nurse Practitioner

## 2023-10-16 VITALS — BP 135/72 | HR 63 | Wt 143.8 lb

## 2023-10-16 DIAGNOSIS — R7989 Other specified abnormal findings of blood chemistry: Secondary | ICD-10-CM | POA: Diagnosis not present

## 2023-10-16 DIAGNOSIS — Z1231 Encounter for screening mammogram for malignant neoplasm of breast: Secondary | ICD-10-CM | POA: Diagnosis not present

## 2023-10-16 DIAGNOSIS — I10 Essential (primary) hypertension: Secondary | ICD-10-CM | POA: Diagnosis not present

## 2023-10-16 DIAGNOSIS — M810 Age-related osteoporosis without current pathological fracture: Secondary | ICD-10-CM | POA: Diagnosis not present

## 2023-10-16 DIAGNOSIS — R8290 Unspecified abnormal findings in urine: Secondary | ICD-10-CM

## 2023-10-16 LAB — POCT URINALYSIS DIP (CLINITEK)
Bilirubin, UA: NEGATIVE
Blood, UA: NEGATIVE
Glucose, UA: NEGATIVE mg/dL
Ketones, POC UA: NEGATIVE mg/dL
Leukocytes, UA: NEGATIVE
Nitrite, UA: NEGATIVE
POC PROTEIN,UA: NEGATIVE
Spec Grav, UA: 1.02 (ref 1.010–1.025)
Urobilinogen, UA: 0.2 U/dL
pH, UA: 5.5 (ref 5.0–8.0)

## 2023-10-16 MED ORDER — AMLODIPINE BESYLATE 10 MG PO TABS: 10.0000 mg | ORAL_TABLET | Freq: Every day | ORAL | 1 refills | Status: DC

## 2023-10-16 MED ORDER — HYDROCHLOROTHIAZIDE 12.5 MG PO CAPS: 12.5000 mg | ORAL_CAPSULE | Freq: Every day | ORAL | 1 refills | Status: DC

## 2023-10-16 MED ORDER — IRBESARTAN 300 MG PO TABS: 300.0000 mg | ORAL_TABLET | Freq: Every day | ORAL | 1 refills | Status: DC

## 2023-10-16 NOTE — Progress Notes (Signed)
 Assessment & Plan:  Michelle Sosa was seen today for medical management of chronic issues.  Diagnoses and all orders for this visit:  Primary hypertension -     irbesartan (AVAPRO) 300 MG tablet; Take 1 tablet (300 mg total) by mouth daily. Please fill as a 90 day supply -     hydrochlorothiazide (MICROZIDE) 12.5 MG capsule; Take 1 capsule (12.5 mg total) by mouth daily. Please fill as a 90 day supply -     amLODipine (NORVASC) 10 MG tablet; Take 1 tablet (10 mg total) by mouth daily. Please fill as a 90 day supply  Malodorous urine -     POCT URINALYSIS DIP (CLINITEK)  Elevated TSH -     Thyroid Panel With TSH  Breast cancer screening by mammogram -     MM 3D SCREENING MAMMOGRAM BILATERAL BREAST; Future  Age-related osteoporosis without current pathological fracture -     DG Bone Density; Future    Patient has been counseled on age-appropriate routine health concerns for screening and prevention. These are reviewed and up-to-date. Referrals have been placed accordingly. Immunizations are up-to-date or declined.    Subjective:   Chief Complaint  Patient presents with   Medical Management of Chronic Issues    Michelle Sosa 78 y.o. female presents to office today for follow up to HTN  She has a PMH of stroke (residual weakness/uses a cane to assist with mobility), HTN, Osteoporosis, hyperlipidemia, Right Breast CA, Hepatitis C   HTN Blood pressure is well-controlled today.  She is taking amlodipine 10 mg daily, hydrochlorothiazide 25 mg daily and irbesartan 300 mg daily as prescribed. BP Readings from Last 3 Encounters:  10/16/23 135/72  10/10/23 122/60  07/17/23 131/75    Notes urine seems to be more concentrated recently.  Denies any other GU symptoms today.    Osteoporosis She is currently taking calcium with D3. Declines fosamax and Prolia.  She is due for repeat bone density this year.   Hypercholesteremia She declines statin or Zetia. Taking omega 3 fish oil 3  tablets daily.  The ASCVD Risk score (Arnett DK, et al., 2019) failed to calculate for the following reasons:   Risk score cannot be calculated because patient has a medical history suggesting prior/existing ASCVD    Review of Systems  Constitutional:  Negative for fever, malaise/fatigue and weight loss.  HENT: Negative.  Negative for nosebleeds.   Eyes: Negative.  Negative for blurred vision, double vision and photophobia.  Respiratory: Negative.  Negative for cough and shortness of breath.   Cardiovascular: Negative.  Negative for chest pain, palpitations and leg swelling.  Gastrointestinal: Negative.  Negative for heartburn, nausea and vomiting.  Genitourinary:        See HPI  Musculoskeletal: Negative.  Negative for myalgias.  Neurological: Negative.  Negative for dizziness, focal weakness, seizures and headaches.  Psychiatric/Behavioral: Negative.  Negative for suicidal ideas.     Past Medical History:  Diagnosis Date   Allergy    Penicillin, sulfa, codeine   Cancer (HCC) 1986   Breast  had right mastectomy & chemo   Hepatitis C    Hyperlipidemia ?   Hypertension    Personal history of chemotherapy    Stroke [facility])    Thyroid disease ?   Hypothyroidism    Past Surgical History:  Procedure Laterality Date   BREAST SURGERY  1986   Right mastectomy   FRACTURE SURGERY     Right leg left knee left arm   KNEE SURGERY  2005   LEG SURGERY     right leg, a rod.   MASTECTOMY Right    WRIST SURGERY  2008    Family History  Problem Relation Age of Onset   Hyperlipidemia Mother    Heart disease Father    Hypertension Father    Hypothyroidism Sister    Breast cancer Neg Hx    Colon cancer Neg Hx    Stomach cancer Neg Hx    Esophageal cancer Neg Hx    Rectal cancer Neg Hx     Social History Reviewed with no changes to be made today.   Outpatient Medications Prior to Visit  Medication Sig Dispense Refill   ASPIRIN 81 PO Take 1 tablet by mouth daily.      Calcium  Carbonate-Vitamin D3 600-400 MG-UNIT TABS Take 1 tablet by mouth 2 (two) times daily.      Multiple Vitamins-Minerals (CENTRUM SILVER 50+WOMEN PO) Take 1 tablet by mouth daily.      Omega-3 Fatty Acids (OMEGA-3 FISH OIL PO) Take 3 tablets by mouth daily.     thiamine (VITAMIN B-1) 100 MG tablet Take 100 mg by mouth daily.     vitamin B-12 (CYANOCOBALAMIN) 100 MCG tablet Take 100 mcg by mouth daily.     amLODipine (NORVASC) 10 MG tablet Take 1 tablet (10 mg total) by mouth daily. Please fill as a 90 day supply 90 tablet 1   hydrochlorothiazide (MICROZIDE) 12.5 MG capsule Take 1 capsule (12.5 mg total) by mouth daily. Please fill as a 90 day supply 90 capsule 1   irbesartan (AVAPRO) 300 MG tablet Take 1 tablet (300 mg total) by mouth daily. Please fill as a 90 day supply 90 tablet 1   No facility-administered medications prior to visit.    Allergies  Allergen Reactions   Metoprolol Tartrate Other (See Comments)    Affects her mood/ lability   Cipro [Ciprofloxacin Hcl]     Joint and muscle pain   Codeine Other (See Comments)   Penicillins Other (See Comments)   Sulfa Antibiotics     States she can take Bactrim if she takes benadryl prior to administration .         Objective:    BP 135/72 (BP Location: Left Arm, Patient Position: Sitting, Cuff Size: Normal)   Pulse 63   Wt 143 lb 12.8 oz (65.2 kg)   SpO2 97%   BMI 25.47 kg/m  Wt Readings from Last 3 Encounters:  10/16/23 143 lb 12.8 oz (65.2 kg)  10/10/23 144 lb (65.3 kg)  07/17/23 141 lb 9.6 oz (64.2 kg)    Physical Exam Vitals and nursing note reviewed.  Constitutional:      Appearance: She is well-developed.  HENT:     Head: Normocephalic and atraumatic.  Cardiovascular:     Rate and Rhythm: Normal rate and regular rhythm.     Heart sounds: Normal heart sounds. No murmur heard.    No friction rub. No gallop.  Pulmonary:     Effort: Pulmonary effort is normal. No tachypnea or respiratory distress.     Breath sounds:  Normal breath sounds. No decreased breath sounds, wheezing, rhonchi or rales.  Chest:     Chest wall: No tenderness.  Abdominal:     General: Bowel sounds are normal.     Palpations: Abdomen is soft.  Musculoskeletal:        General: Normal range of motion.     Cervical back: Normal range of motion.  Skin:  General: Skin is warm and dry.  Neurological:     Mental Status: She is alert and oriented to person, place, and time.     Coordination: Coordination normal.  Psychiatric:        Behavior: Behavior normal. Behavior is cooperative.        Thought Content: Thought content normal.        Judgment: Judgment normal.          Patient has been counseled extensively about nutrition and exercise as well as the importance of adherence with medications and regular follow-up. The patient was given clear instructions to go to ER or return to medical center if symptoms don't improve, worsen or new problems develop. The patient verbalized understanding.   Follow-up: Return in about 3 months (around 01/16/2024).   Claiborne Rigg, FNP-BC [facility] [place], [place] [phone]   10/16/2023, 12:47 PM

## 2023-10-17 ENCOUNTER — Encounter: Payer: Self-pay | Admitting: Nurse Practitioner

## 2023-10-17 LAB — THYROID PANEL WITH TSH
Free Thyroxine Index: 2.7 (ref 1.2–4.9)
T3 Uptake Ratio: 22 % — ABNORMAL LOW (ref 24–39)
T4, Total: 12.2 ug/dL — ABNORMAL HIGH (ref 4.5–12.0)
TSH: 2.71 u[IU]/mL (ref 0.450–4.500)

## 2023-12-02 ENCOUNTER — Other Ambulatory Visit: Payer: Self-pay | Admitting: Family Medicine

## 2023-12-02 DIAGNOSIS — I10 Essential (primary) hypertension: Secondary | ICD-10-CM

## 2023-12-04 NOTE — Telephone Encounter (Signed)
 Change of pharmacy Requested Prescriptions  Pending Prescriptions Disp Refills   hydrochlorothiazide  (MICROZIDE ) 12.5 MG capsule [Pharmacy Med Name: HYDROCHLOROTHIAZIDE  12.5MG  CAPSULES] 90 capsule 0    Sig: TAKE ONE CAPSULE BY MOUTH DAILY     Cardiovascular: Diuretics - Thiazide Passed - 12/04/2023 10:16 AM      Passed - Cr in normal range and within 180 days    Creat  Date Value Ref Range Status  01/07/2016 0.62 0.50 - 0.99 mg/dL Final   Creatinine, Ser  Date Value Ref Range Status  07/17/2023 0.71 0.57 - 1.00 mg/dL Final         Passed - K in normal range and within 180 days    Potassium  Date Value Ref Range Status  07/17/2023 4.5 3.5 - 5.2 mmol/L Final         Passed - Na in normal range and within 180 days    Sodium  Date Value Ref Range Status  07/17/2023 140 134 - 144 mmol/L Final         Passed - Last BP in normal range    BP Readings from Last 1 Encounters:  10/16/23 135/72         Passed - Valid encounter within last 6 months    Recent Outpatient Visits           1 month ago Primary hypertension   [facility] [facility] [place]. [facility] Collins Dean, NP   4 months ago Primary hypertension   [facility] [facility] [place]. [facility] Collins Dean, NP   7 months ago Primary hypertension   [facility] [place] [facility] [place]. [facility] Collins Dean, NP   11 months ago Primary hypertension   [place] [facility] [place]. [facility] Collins Dean, NP   1 year ago Primary hypertension   [place] [facility] [place]. [facility] Jacobs, Stephen L, RPH-CPP               amLODipine  (NORVASC ) 10 MG tablet [Pharmacy Med Name: AMLODIPINE  BESYLATE 10MG  TABLETS] 90 tablet 0    Sig: TAKE 1 TABLET BY MOUTH DAILY     Cardiovascular: Calcium  Channel Blockers 2 Passed - 12/04/2023  10:16 AM      Passed - Last BP in normal range    BP Readings from Last 1 Encounters:  10/16/23 135/72         Passed - Last Heart Rate in normal range    Pulse Readings from Last 1 Encounters:  10/16/23 63         Passed - Valid encounter within last 6 months    Recent Outpatient Visits           1 month ago Primary hypertension   [place] [facility] [place]. [facility] Collins Dean, NP   4 months ago Primary hypertension   [facility] [facility] [place]. [facility] Collins Dean, NP   7 months ago Primary hypertension   [place] [facility] . [facility] Collins Dean, NP   11 months ago Primary hypertension    [facility]  [place]. [facility] Collins Dean, NP   1 year ago Primary hypertension   [place] [facility] [place] [place]. [facility] Freada Jacobs, Jonathon Neighbors, RPH-CPP               irbesartan  (AVAPRO ) 300 MG tablet [Pharmacy Med Name: IRBESARTAN  300MG  TABLETS] 90 tablet 0    Sig: TAKE 1 TABLET BY MOUTH DAILY     Cardiovascular:  Angiotensin Receptor Blockers Passed - 12/04/2023 10:16 AM      Passed - Cr in normal range and within 180 days    Creat  Date Value Ref Range Status  01/07/2016 0.62 0.50 - 0.99 mg/dL Final   Creatinine, Ser  Date Value Ref Range Status  07/17/2023 0.71 0.57 - 1.00 mg/dL Final         Passed - K in normal range and within 180 days    Potassium  Date Value Ref Range Status  07/17/2023 4.5 3.5 - 5.2 mmol/L Final         Passed - Patient is not pregnant      Passed - Last BP in normal range    BP Readings from Last 1 Encounters:  10/16/23 135/72         Passed - Valid encounter within last 6 months    Recent Outpatient Visits           1 month ago Primary hypertension   [place] [facility] [place]. [facility] Collins Dean, NP   4 months ago Primary hypertension   [place] [facility] [place]. [facility] Collins Dean, NP   7 months ago Primary hypertension   [place] [facility] [place]. [facility] Collins Dean, NP   11 months ago Primary hypertension   [place] [facility] [place]. [facility] Collins Dean, NP   1 year ago Primary hypertension   [place] [facility] [facility] [place]. [facility] Valente Gaskin, RPH-CPP

## 2023-12-06 ENCOUNTER — Other Ambulatory Visit (HOSPITAL_BASED_OUTPATIENT_CLINIC_OR_DEPARTMENT_OTHER): Payer: Self-pay | Admitting: Pharmacist

## 2023-12-06 ENCOUNTER — Encounter: Payer: Self-pay | Admitting: Pharmacist

## 2023-12-06 DIAGNOSIS — I10 Essential (primary) hypertension: Secondary | ICD-10-CM

## 2023-12-06 NOTE — Progress Notes (Signed)
 Pharmacy Quality Measure Review  This patient is appearing on a report for being at risk of failing the adherence measure for hypertension (ACEi/ARB) medications this calendar year.   Medication: irbesartan  Last fill date: 12/04/2023 for 90 day supply  Refills are up to date. Reminder set for next refill in July.   Marene Shape, PharmD, Becky Bowels, CPP Clinical Pharmacist [facility] & [facility] [phone]

## 2024-01-16 ENCOUNTER — Ambulatory Visit: Attending: Nurse Practitioner | Admitting: Nurse Practitioner

## 2024-01-16 ENCOUNTER — Encounter: Payer: Self-pay | Admitting: Nurse Practitioner

## 2024-01-16 VITALS — BP 148/73 | HR 62 | Ht 63.0 in | Wt 145.6 lb

## 2024-01-16 DIAGNOSIS — I10 Essential (primary) hypertension: Secondary | ICD-10-CM | POA: Diagnosis not present

## 2024-01-16 DIAGNOSIS — R7989 Other specified abnormal findings of blood chemistry: Secondary | ICD-10-CM

## 2024-01-16 DIAGNOSIS — R350 Frequency of micturition: Secondary | ICD-10-CM

## 2024-01-16 MED ORDER — IRBESARTAN 300 MG PO TABS: 300.0000 mg | ORAL_TABLET | Freq: Every day | ORAL | 1 refills | Status: DC

## 2024-01-16 MED ORDER — HYDROCHLOROTHIAZIDE 12.5 MG PO CAPS: 12.5000 mg | ORAL_CAPSULE | Freq: Every day | ORAL | 1 refills | Status: DC

## 2024-01-16 MED ORDER — AMLODIPINE BESYLATE 10 MG PO TABS: 10.0000 mg | ORAL_TABLET | Freq: Every day | ORAL | 1 refills | Status: DC

## 2024-01-16 NOTE — Progress Notes (Signed)
 Assessment & Plan:   Michelle Sosa was seen today for hypertension.  Diagnoses and all orders for this visit:  Primary hypertension Instruction: bring bloop pressure monitoring device to next office visit.  -     CMP14+EGFR -     amLODipine  (NORVASC ) 10 MG tablet; Take 1 tablet (10 mg total) by mouth daily. -     hydrochlorothiazide  (MICROZIDE ) 12.5 MG capsule; Take 1 capsule (12.5 mg total) by mouth daily. -     irbesartan  (AVAPRO ) 300 MG tablet; Take 1 tablet (300 mg total) by mouth daily. Continue all antihypertensives as prescribed.  Reminded to bring in blood pressure log for follow  up appointment.  RECOMMENDATIONS: DASH/Mediterranean Diets are healthier choices for HTN.    Increased urinary frequency -     Urine Culture -     Urinalysis, Complete  Abnormal TSH -     Thyroid  Panel With TSH  Abnormal CBC -     CBC with Differential/Platelet    Patient has been counseled on age-appropriate routine health concerns for screening and prevention. These are reviewed and up-to-date. Referrals have been placed accordingly. Immunizations are up-to-date or declined.    Subjective:   Chief Complaint  Patient presents with   Hypertension    Michelle Sosa 78 y.o. female presents to office today for follow up to HTN   She has a PMH of stroke (residual weakness/uses a cane to assist with mobility), HTN, Osteoporosis, hyperlipidemia, Right Breast CA, Hepatitis C   HTN Blood pressure is elevated today. She reports normal readings at home. Currently taking amlodipine  and irbesartan  as prescribed.  BP Readings from Last 3 Encounters:  01/16/24 (!) 148/73  10/16/23 135/72  10/10/23 122/60     Notes increased urinary frequency. Denies flank pain, dysuria or pelvic pressure. Associated symptoms include urinary hesitancy.      Review of Systems  Constitutional:  Negative for fever, malaise/fatigue and weight loss.  HENT: Negative.  Negative for nosebleeds.   Eyes: Negative.   Negative for blurred vision, double vision and photophobia.  Respiratory: Negative.  Negative for cough and shortness of breath.   Cardiovascular: Negative.  Negative for chest pain, palpitations and leg swelling.  Gastrointestinal: Negative.  Negative for heartburn, nausea and vomiting.  Genitourinary:  Positive for frequency and urgency. Negative for dysuria, flank pain and hematuria.       SEE HPI  Musculoskeletal: Negative.  Negative for myalgias.  Neurological: Negative.  Negative for dizziness, focal weakness, seizures and headaches.  Psychiatric/Behavioral: Negative.  Negative for suicidal ideas.     Past Medical History:  Diagnosis Date   Allergy    Penicillin, sulfa , codeine   Cancer (HCC) 1986   Breast  had right mastectomy & chemo   Hepatitis C    Hyperlipidemia ?   Hypertension    Personal history of chemotherapy    Stroke [facility])    Thyroid  disease ?   Hypothyroidism    Past Surgical History:  Procedure Laterality Date   BREAST SURGERY  1986   Right mastectomy   FRACTURE SURGERY     Right leg left knee left arm   KNEE SURGERY  2005   LEG SURGERY     right leg, a rod.   MASTECTOMY Right    WRIST SURGERY  2008    Family History  Problem Relation Age of Onset   Hyperlipidemia Mother    Heart disease Father    Hypertension Father    Hypothyroidism Sister    Breast  cancer Neg Hx    Colon cancer Neg Hx    Stomach cancer Neg Hx    Esophageal cancer Neg Hx    Rectal cancer Neg Hx     Social History Reviewed with no changes to be made today.   Outpatient Medications Prior to Visit  Medication Sig Dispense Refill   ASPIRIN  81 PO Take 1 tablet by mouth daily.      Calcium  Carbonate-Vitamin D3 600-400 MG-UNIT TABS Take 1 tablet by mouth 2 (two) times daily.      Multiple Vitamins-Minerals (CENTRUM SILVER 50+WOMEN PO) Take 1 tablet by mouth daily.      Omega-3 Fatty Acids (OMEGA-3 FISH OIL PO) Take 3 tablets by mouth daily.     thiamine (VITAMIN B-1) 100 MG  tablet Take 100 mg by mouth daily.     vitamin B-12 (CYANOCOBALAMIN) 100 MCG tablet Take 100 mcg by mouth daily.     amLODipine  (NORVASC ) 10 MG tablet TAKE 1 TABLET BY MOUTH DAILY 90 tablet 0   hydrochlorothiazide  (MICROZIDE ) 12.5 MG capsule TAKE ONE CAPSULE BY MOUTH DAILY 90 capsule 0   irbesartan  (AVAPRO ) 300 MG tablet TAKE 1 TABLET BY MOUTH DAILY 90 tablet 0   No facility-administered medications prior to visit.    Allergies  Allergen Reactions   Metoprolol Tartrate Other (See Comments)    Affects her mood/ lability   Cipro  [Ciprofloxacin  Hcl]     Joint and muscle pain   Codeine Other (See Comments)   Penicillins Other (See Comments)   Sulfa  Antibiotics     States she can take Bactrim  if she takes benadryl  prior to administration .         Objective:    BP (!) 148/73 (BP Location: Left Arm, Patient Position: Sitting, Cuff Size: Normal)   Pulse 62   Ht 5\' 3"  (1.6 m)   Wt 145 lb 9.6 oz (66 kg)   SpO2 98%   BMI 25.79 kg/m  Wt Readings from Last 3 Encounters:  01/16/24 145 lb 9.6 oz (66 kg)  10/16/23 143 lb 12.8 oz (65.2 kg)  10/10/23 144 lb (65.3 kg)    Physical Exam Vitals and nursing note reviewed.  Constitutional:      Appearance: She is well-developed.  HENT:     Head: Normocephalic and atraumatic.  Cardiovascular:     Rate and Rhythm: Normal rate and regular rhythm.     Heart sounds: Normal heart sounds. No murmur heard.    No friction rub. No gallop.  Pulmonary:     Effort: Pulmonary effort is normal. No tachypnea or respiratory distress.     Breath sounds: Normal breath sounds. No decreased breath sounds, wheezing, rhonchi or rales.  Chest:     Chest wall: No tenderness.  Abdominal:     General: Bowel sounds are normal.     Palpations: Abdomen is soft.  Musculoskeletal:        General: Normal range of motion.     Cervical back: Normal range of motion.  Skin:    General: Skin is warm and dry.  Neurological:     Mental Status: She is alert and  oriented to person, place, and time.     Coordination: Coordination normal.  Psychiatric:        Behavior: Behavior normal. Behavior is cooperative.        Thought Content: Thought content normal.        Judgment: Judgment normal.          Patient has  been counseled extensively about nutrition and exercise as well as the importance of adherence with medications and regular follow-up. The patient was given clear instructions to go to ER or return to medical center if symptoms don't improve, worsen or new problems develop. The patient verbalized understanding.   Follow-up: Return in about 3 months (around 04/22/2024).   Collins Dean, FNP-BC [facility] and [facility] [place], [place] [phone]   01/16/2024, 11:32 AM

## 2024-01-17 ENCOUNTER — Ambulatory Visit: Payer: Self-pay | Admitting: Nurse Practitioner

## 2024-01-17 ENCOUNTER — Other Ambulatory Visit: Payer: Self-pay | Admitting: Nurse Practitioner

## 2024-01-17 DIAGNOSIS — N3000 Acute cystitis without hematuria: Secondary | ICD-10-CM

## 2024-01-17 LAB — CBC WITH DIFFERENTIAL/PLATELET
Basophils Absolute: 0.1 10*3/uL (ref 0.0–0.2)
Basos: 1 %
EOS (ABSOLUTE): 0.8 10*3/uL — ABNORMAL HIGH (ref 0.0–0.4)
Eos: 8 %
Hematocrit: 43.2 % (ref 34.0–46.6)
Hemoglobin: 14.4 g/dL (ref 11.1–15.9)
Immature Grans (Abs): 0 10*3/uL (ref 0.0–0.1)
Immature Granulocytes: 0 %
Lymphocytes Absolute: 2.4 10*3/uL (ref 0.7–3.1)
Lymphs: 25 %
MCH: 32.6 pg (ref 26.6–33.0)
MCHC: 33.3 g/dL (ref 31.5–35.7)
MCV: 98 fL — ABNORMAL HIGH (ref 79–97)
Monocytes Absolute: 0.7 10*3/uL (ref 0.1–0.9)
Monocytes: 8 %
Neutrophils Absolute: 5.7 10*3/uL (ref 1.4–7.0)
Neutrophils: 58 %
Platelets: 349 10*3/uL (ref 150–450)
RBC: 4.42 x10E6/uL (ref 3.77–5.28)
RDW: 11.8 % (ref 11.7–15.4)
WBC: 9.7 10*3/uL (ref 3.4–10.8)

## 2024-01-17 LAB — THYROID PANEL WITH TSH
Free Thyroxine Index: 3 (ref 1.2–4.9)
T3 Uptake Ratio: 25 % (ref 24–39)
T4, Total: 11.9 ug/dL (ref 4.5–12.0)
TSH: 6.17 u[IU]/mL — ABNORMAL HIGH (ref 0.450–4.500)

## 2024-01-17 LAB — CMP14+EGFR
ALT: 32 IU/L (ref 0–32)
AST: 30 IU/L (ref 0–40)
Albumin: 4.3 g/dL (ref 3.8–4.8)
Alkaline Phosphatase: 81 IU/L (ref 44–121)
BUN/Creatinine Ratio: 21 (ref 12–28)
BUN: 14 mg/dL (ref 8–27)
Bilirubin Total: 0.4 mg/dL (ref 0.0–1.2)
CO2: 22 mmol/L (ref 20–29)
Calcium: 10.1 mg/dL (ref 8.7–10.3)
Chloride: 97 mmol/L (ref 96–106)
Creatinine, Ser: 0.66 mg/dL (ref 0.57–1.00)
Globulin, Total: 2.8 g/dL (ref 1.5–4.5)
Glucose: 91 mg/dL (ref 70–99)
Potassium: 4.5 mmol/L (ref 3.5–5.2)
Sodium: 134 mmol/L (ref 134–144)
Total Protein: 7.1 g/dL (ref 6.0–8.5)
eGFR: 91 mL/min/{1.73_m2} (ref >59)

## 2024-01-17 LAB — URINALYSIS, COMPLETE
Bilirubin, UA: NEGATIVE
Glucose, UA: NEGATIVE
Ketones, UA: NEGATIVE
Nitrite, UA: POSITIVE — AB
Protein,UA: NEGATIVE
RBC, UA: NEGATIVE
Specific Gravity, UA: 1.013 (ref 1.005–1.030)
Urobilinogen, Ur: 0.2 mg/dL (ref 0.2–1.0)
pH, UA: 6.5 (ref 5.0–7.5)

## 2024-01-17 LAB — MICROSCOPIC EXAMINATION
Casts: NONE SEEN /LPF
RBC, Urine: NONE SEEN /HPF (ref 0–2)
WBC, UA: >30 /HPF — AB (ref 0–5)

## 2024-01-17 MED ORDER — SULFAMETHOXAZOLE-TRIMETHOPRIM 800-160 MG PO TABS: 1.0000 | ORAL_TABLET | Freq: Two times a day (BID) | ORAL | 0 refills | Status: AC

## 2024-01-19 LAB — URINE CULTURE

## 2024-01-29 DIAGNOSIS — C50911 Malignant neoplasm of unspecified site of right female breast: Secondary | ICD-10-CM | POA: Diagnosis not present

## 2024-02-28 ENCOUNTER — Other Ambulatory Visit: Payer: Self-pay | Admitting: Pharmacist

## 2024-02-28 NOTE — Progress Notes (Signed)
 Pharmacy Quality Measure Review  This patient is appearing on a report for being at risk of failing the adherence measure for hypertension (ACEi/ARB) medications this calendar year.   Medication: irbesartan  Last fill date: 12/07/2023 for 90 day supply  Refills are up to date. Reminder set for refill next week.   Herlene Fleeta Morris, PharmD, JAQUELINE, CPP Clinical Pharmacist [facility] & [facility] [phone]

## 2024-03-06 ENCOUNTER — Other Ambulatory Visit: Payer: Self-pay | Admitting: Pharmacist

## 2024-03-06 NOTE — Progress Notes (Signed)
 Pharmacy Quality Measure Review  This patient reviewed today for adherence measure for hypertension (ACEi/ARB) medications this calendar year.   Medication: irbesartan  Last fill date: 03/04/2024 for 90 day supply  Refills are up to date. Reminder set for refill in 3 months.   Herlene Fleeta Morris, PharmD, JAQUELINE, CPP Clinical Pharmacist [facility] & [facility] [phone]

## 2024-03-27 DIAGNOSIS — C50911 Malignant neoplasm of unspecified site of right female breast: Secondary | ICD-10-CM | POA: Diagnosis not present

## 2024-04-19 ENCOUNTER — Ambulatory Visit
Admission: RE | Admit: 2024-04-19 | Discharge: 2024-04-19 | Disposition: A | Discharge status: Home / Self Care | Source: Ambulatory Visit | Attending: Nurse Practitioner | Admitting: Nurse Practitioner

## 2024-04-19 DIAGNOSIS — Z1231 Encounter for screening mammogram for malignant neoplasm of breast: Secondary | ICD-10-CM | POA: Diagnosis not present

## 2024-04-22 ENCOUNTER — Telehealth: Payer: Self-pay | Admitting: Nurse Practitioner

## 2024-04-22 NOTE — Telephone Encounter (Signed)
Pt confirmed appt 9/8

## 2024-04-23 ENCOUNTER — Encounter: Payer: Self-pay | Admitting: Nurse Practitioner

## 2024-04-23 ENCOUNTER — Ambulatory Visit: Attending: Nurse Practitioner | Admitting: Nurse Practitioner

## 2024-04-23 VITALS — BP 160/75 | HR 75 | Resp 18 | Ht 63.0 in | Wt 146.8 lb

## 2024-04-23 DIAGNOSIS — E038 Other specified hypothyroidism: Secondary | ICD-10-CM

## 2024-04-23 DIAGNOSIS — R7989 Other specified abnormal findings of blood chemistry: Secondary | ICD-10-CM | POA: Diagnosis not present

## 2024-04-23 DIAGNOSIS — E7800 Pure hypercholesterolemia, unspecified: Secondary | ICD-10-CM | POA: Diagnosis not present

## 2024-04-23 DIAGNOSIS — R350 Frequency of micturition: Secondary | ICD-10-CM

## 2024-04-23 DIAGNOSIS — I10 Essential (primary) hypertension: Secondary | ICD-10-CM

## 2024-04-23 NOTE — Progress Notes (Signed)
 Assessment & Plan:  Michelle Sosa was seen today for hypertension.  Diagnoses and all orders for this visit:  Primary hypertension  Frequency of urination -     Urinalysis, Complete -     Urine Culture; Future -     Urine Culture  Subclinical hypothyroidism -     Thyroid  Panel With TSH  Hypercholesteremia -     Lipid panel  Abnormal CBC -     Vitamin B12    Patient has been counseled on age-appropriate routine health concerns for screening and prevention. These are reviewed and up-to-date. Referrals have been placed accordingly. Immunizations are up-to-date or declined.    Subjective:   Chief Complaint  Patient presents with   Hypertension    Michelle Sosa 78 y.o. female presents to office today for follow-up to hypertension.   She has a PMH of stroke (residual weakness/uses a cane to assist with mobility), HTN, Osteoporosis (declines bisphosphonate mgmt), hyperlipidemia, Right Breast CA, Hepatitis C     HTN Blood pressure is elevated today. She reports normal readings at home. Currently taking amlodipine  and irbesartan  as prescribed. She had a virtual visit in the past and home device showed normal readings.  BP Readings from Last 3 Encounters:  04/23/24 (!) 160/75  01/16/24 (!) 148/73  10/16/23 135/72    Osteoporosis She is currently taking calcium  with D3. Declines fosamax  and Prolia. Bone density scheduled for tomorrow.    Hypercholesteremia She declines statin or Zetia . Taking omega 3 fish oil 3 tablets daily.    She has subclinical hypothyroidism. Not currently managed with levothyroxine.   She is experiencing increased urination specifically at night.  Review of Systems  Constitutional:  Negative for fever, malaise/fatigue and weight loss.  HENT: Negative.  Negative for nosebleeds.   Eyes: Negative.  Negative for blurred vision, double vision and photophobia.  Respiratory: Negative.  Negative for cough and shortness of breath.   Cardiovascular:  Negative.  Negative for chest pain, palpitations and leg swelling.  Gastrointestinal: Negative.  Negative for heartburn, nausea and vomiting.  Genitourinary:  Positive for frequency.  Musculoskeletal: Negative.  Negative for myalgias.  Neurological:  Positive for weakness. Negative for dizziness, focal weakness, seizures and headaches.  Psychiatric/Behavioral: Negative.  Negative for suicidal ideas.     Past Medical History:  Diagnosis Date   Allergy    Penicillin, sulfa , codeine   Cancer (HCC) 1986   Breast  had right mastectomy & chemo   Hepatitis C    Hyperlipidemia ?   Hypertension    Personal history of chemotherapy    Stroke [facility])    Thyroid  disease ?   Hypothyroidism    Past Surgical History:  Procedure Laterality Date   BREAST SURGERY  1986   Right mastectomy   FRACTURE SURGERY     Right leg left knee left arm   KNEE SURGERY  2005   LEG SURGERY     right leg, a rod.   MASTECTOMY Right    WRIST SURGERY  2008    Family History  Problem Relation Age of Onset   Hyperlipidemia Mother    Heart disease Father    Hypertension Father    Hypothyroidism Sister    Breast cancer Neg Hx    Colon cancer Neg Hx    Stomach cancer Neg Hx    Esophageal cancer Neg Hx    Rectal cancer Neg Hx     Social History Reviewed with no changes to be made today.   Outpatient Medications  Prior to Visit  Medication Sig Dispense Refill   amLODipine  (NORVASC ) 10 MG tablet Take 1 tablet (10 mg total) by mouth daily. 90 tablet 1   ASPIRIN  81 PO Take 1 tablet by mouth daily.      Calcium  Carbonate-Vitamin D3 600-400 MG-UNIT TABS Take 1 tablet by mouth 2 (two) times daily.      hydrochlorothiazide  (MICROZIDE ) 12.5 MG capsule Take 1 capsule (12.5 mg total) by mouth daily. 90 capsule 1   irbesartan  (AVAPRO ) 300 MG tablet Take 1 tablet (300 mg total) by mouth daily. 90 tablet 1   Multiple Vitamins-Minerals (CENTRUM SILVER 50+WOMEN PO) Take 1 tablet by mouth daily.      Omega-3 Fatty Acids  (OMEGA-3 FISH OIL PO) Take 3 tablets by mouth daily.     thiamine (VITAMIN B-1) 100 MG tablet Take 100 mg by mouth daily.     vitamin B-12 (CYANOCOBALAMIN) 100 MCG tablet Take 100 mcg by mouth daily.     No facility-administered medications prior to visit.    Allergies  Allergen Reactions   Metoprolol Tartrate Other (See Comments)    Affects her mood/ lability   Cipro  [Ciprofloxacin  Hcl]     Joint and muscle pain   Codeine Other (See Comments)   Penicillins Other (See Comments)   Sulfa  Antibiotics     States she can take Bactrim  if she takes benadryl  prior to administration .         Objective:    BP (!) 160/75 (BP Location: Left Arm, Patient Position: Sitting, Cuff Size: Normal)   Pulse 75   Resp 18   Ht 5' 3 (1.6 m)   Wt 146 lb 12.8 oz (66.6 kg)   SpO2 96%   BMI 26.00 kg/m  Wt Readings from Last 3 Encounters:  04/23/24 146 lb 12.8 oz (66.6 kg)  01/16/24 145 lb 9.6 oz (66 kg)  10/16/23 143 lb 12.8 oz (65.2 kg)    Physical Exam Vitals and nursing note reviewed.  Constitutional:      Appearance: She is well-developed.  HENT:     Head: Normocephalic and atraumatic.  Cardiovascular:     Rate and Rhythm: Normal rate and regular rhythm.     Heart sounds: Normal heart sounds. No murmur heard.    No friction rub. No gallop.  Pulmonary:     Effort: Pulmonary effort is normal. No tachypnea or respiratory distress.     Breath sounds: Normal breath sounds. No decreased breath sounds, wheezing, rhonchi or rales.  Chest:     Chest wall: No tenderness.  Musculoskeletal:        General: Normal range of motion.     Cervical back: Normal range of motion.  Skin:    General: Skin is warm and dry.  Neurological:     Mental Status: She is alert and oriented to person, place, and time.     Coordination: Coordination normal.  Psychiatric:        Behavior: Behavior normal. Behavior is cooperative.        Thought Content: Thought content normal.        Judgment: Judgment normal.           Patient has been counseled extensively about nutrition and exercise as well as the importance of adherence with medications and regular follow-up. The patient was given clear instructions to go to ER or return to medical center if symptoms don't improve, worsen or new problems develop. The patient verbalized understanding.   Follow-up: Return in about  3 months (around 08/02/2024).   Haze LELON Servant, FNP-BC [facility] and [facility] [place], [place] [phone]   04/23/2024, 11:12 AM

## 2024-04-24 ENCOUNTER — Ambulatory Visit (HOSPITAL_BASED_OUTPATIENT_CLINIC_OR_DEPARTMENT_OTHER)
Admission: RE | Admit: 2024-04-24 | Discharge: 2024-04-24 | Disposition: A | Discharge status: Home / Self Care | Source: Ambulatory Visit | Attending: Nurse Practitioner | Admitting: Nurse Practitioner

## 2024-04-24 DIAGNOSIS — M810 Age-related osteoporosis without current pathological fracture: Secondary | ICD-10-CM | POA: Diagnosis not present

## 2024-04-24 DIAGNOSIS — Z780 Asymptomatic menopausal state: Secondary | ICD-10-CM | POA: Diagnosis not present

## 2024-04-24 LAB — LIPID PANEL
Chol/HDL Ratio: 3.7 ratio (ref 0.0–4.4)
Cholesterol, Total: 221 mg/dL — ABNORMAL HIGH (ref 100–199)
HDL: 60 mg/dL (ref >39)
LDL Chol Calc (NIH): 138 mg/dL — ABNORMAL HIGH (ref 0–99)
Triglycerides: 131 mg/dL (ref 0–149)
VLDL Cholesterol Cal: 23 mg/dL (ref 5–40)

## 2024-04-24 LAB — URINALYSIS, COMPLETE
Bilirubin, UA: NEGATIVE
Glucose, UA: NEGATIVE
Ketones, UA: NEGATIVE
Nitrite, UA: NEGATIVE
Protein,UA: NEGATIVE
RBC, UA: NEGATIVE
Specific Gravity, UA: 1.012 (ref 1.005–1.030)
Urobilinogen, Ur: 0.2 mg/dL (ref 0.2–1.0)
pH, UA: 7 (ref 5.0–7.5)

## 2024-04-24 LAB — VITAMIN B12: Vitamin B-12: 1165 pg/mL (ref 232–1245)

## 2024-04-24 LAB — THYROID PANEL WITH TSH
Free Thyroxine Index: 3.3 (ref 1.2–4.9)
T3 Uptake Ratio: 25 % (ref 24–39)
T4, Total: 13 ug/dL — ABNORMAL HIGH (ref 4.5–12.0)
TSH: 5.97 u[IU]/mL — ABNORMAL HIGH (ref 0.450–4.500)

## 2024-04-24 LAB — MICROSCOPIC EXAMINATION
Bacteria, UA: NONE SEEN
Casts: NONE SEEN /LPF
Epithelial Cells (non renal): NONE SEEN /HPF (ref 0–10)
RBC, Urine: NONE SEEN /HPF (ref 0–2)

## 2024-04-25 ENCOUNTER — Ambulatory Visit: Payer: Self-pay | Admitting: Nurse Practitioner

## 2024-04-25 LAB — URINE CULTURE

## 2024-04-30 ENCOUNTER — Ambulatory Visit: Payer: Self-pay | Admitting: Nurse Practitioner

## 2024-06-05 ENCOUNTER — Other Ambulatory Visit: Payer: Self-pay | Admitting: Pharmacist

## 2024-06-05 NOTE — Progress Notes (Signed)
 Pharmacy Quality Measure Review  This patient reviewed today for adherence measure for hypertension (ACEi/ARB) medications this calendar year.   Medication: irbesartan  Last fill date: 05/31/2024 for 90 day supply  Refills are up to date. No further action needed at this time.   Herlene Fleeta Morris, PharmD, JAQUELINE, CPP Clinical Pharmacist [facility] & [facility] [phone]

## 2024-06-17 ENCOUNTER — Other Ambulatory Visit

## 2024-07-31 ENCOUNTER — Encounter: Payer: Self-pay | Admitting: Nurse Practitioner

## 2024-07-31 ENCOUNTER — Ambulatory Visit: Attending: Nurse Practitioner | Admitting: Nurse Practitioner

## 2024-07-31 VITALS — BP 135/72 | HR 71 | Ht 63.0 in | Wt 148.6 lb

## 2024-07-31 DIAGNOSIS — I10 Essential (primary) hypertension: Secondary | ICD-10-CM

## 2024-07-31 DIAGNOSIS — E038 Other specified hypothyroidism: Secondary | ICD-10-CM

## 2024-07-31 DIAGNOSIS — E785 Hyperlipidemia, unspecified: Secondary | ICD-10-CM

## 2024-07-31 DIAGNOSIS — M810 Age-related osteoporosis without current pathological fracture: Secondary | ICD-10-CM | POA: Diagnosis not present

## 2024-07-31 DIAGNOSIS — Z853 Personal history of malignant neoplasm of breast: Secondary | ICD-10-CM

## 2024-07-31 MED ORDER — HYDROCHLOROTHIAZIDE 12.5 MG PO CAPS: 12.5000 mg | ORAL_CAPSULE | Freq: Every day | ORAL | 1 refills | Status: AC

## 2024-07-31 MED ORDER — AMLODIPINE BESYLATE 10 MG PO TABS: 10.0000 mg | ORAL_TABLET | Freq: Every day | ORAL | 1 refills | Status: AC

## 2024-07-31 MED ORDER — IRBESARTAN 300 MG PO TABS: 300.0000 mg | ORAL_TABLET | Freq: Every day | ORAL | 1 refills | Status: AC

## 2024-07-31 NOTE — Progress Notes (Signed)
 Assessment & Plan:  Michelle Sosa was seen today for hypertension.  Diagnoses and all orders for this visit:  Primary hypertension -     amLODipine  (NORVASC ) 10 MG tablet; Take 1 tablet (10 mg total) by mouth daily. -     hydrochlorothiazide  (MICROZIDE ) 12.5 MG capsule; Take 1 capsule (12.5 mg total) by mouth daily. -     irbesartan  (AVAPRO ) 300 MG tablet; Take 1 tablet (300 mg total) by mouth daily. -     CMP14+EGFR Continue all antihypertensives as prescribed.  Reminded to bring in blood pressure log for follow  up appointment.  RECOMMENDATIONS: DASH/Mediterranean Diets are healthier choices for HTN.    History of breast cancer Completed therapy   Age-related osteoporosis without current pathological fracture Declines antiresorptives and anabolic treatment therapy  Other specified hypothyroidism -     Thyroid  Panel With TSH Start levothyroxine for TSH >7  Dyslipidemia -     Lipid panel May need to start Zetia  based on ASCVD    Patient has been counseled on age-appropriate routine health concerns for screening and prevention. These are reviewed and up-to-date. Referrals have been placed accordingly. Immunizations are up-to-date or declined.    Subjective:   Chief Complaint  Patient presents with   Hypertension    Michelle Sosa 78 y.o. female presents to office today   She has a PMH of stroke (residual weakness/uses a cane to assist with mobility), HTN, Osteoporosis (declines bisphosphonate mgmt), hyperlipidemia, Right Breast CA, Hepatitis C    HTN Blood pressure is elevated today. She reports normal readings at home. Currently taking amlodipine  and irbesartan  as prescribed. She had a virtual visit in the past and home device showed normal readings.  BP Readings from Last 3 Encounters:  07/31/24 135/72  04/23/24 (!) 160/75  01/16/24 (!) 148/73    Osteoporosis She is currently taking calcium  with D3. Declines fosamax  and Prolia. Bone density scheduled for tomorrow.      Review of Systems  Constitutional:  Negative for fever, malaise/fatigue and weight loss.  HENT: Negative.  Negative for nosebleeds.   Eyes: Negative.  Negative for blurred vision, double vision and photophobia.  Respiratory: Negative.  Negative for cough and shortness of breath.   Cardiovascular: Negative.  Negative for chest pain, palpitations and leg swelling.  Gastrointestinal: Negative.  Negative for heartburn, nausea and vomiting.  Musculoskeletal: Negative.  Negative for myalgias.  Neurological: Negative.  Negative for dizziness, focal weakness, seizures and headaches.  Psychiatric/Behavioral: Negative.  Negative for suicidal ideas.     Past Medical History:  Diagnosis Date   Allergy    Penicillin, sulfa , codeine   Cancer (HCC) 1986   Breast  had right mastectomy & chemo   Hepatitis C    Hyperlipidemia ?   Hypertension    Personal history of chemotherapy    Stroke [facility])    Thyroid  disease ?   Hypothyroidism    Past Surgical History:  Procedure Laterality Date   BREAST SURGERY  1986   Right mastectomy   FRACTURE SURGERY     Right leg left knee left arm   KNEE SURGERY  2005   LEG SURGERY     right leg, a rod.   MASTECTOMY Right    WRIST SURGERY  2008    Family History  Problem Relation Age of Onset   Hyperlipidemia Mother    Heart disease Father    Hypertension Father    Hypothyroidism Sister    Breast cancer Neg Hx    Colon  cancer Neg Hx    Stomach cancer Neg Hx    Esophageal cancer Neg Hx    Rectal cancer Neg Hx     Social History Reviewed with no changes to be made today.   Outpatient Medications Prior to Visit  Medication Sig Dispense Refill   ASPIRIN  81 PO Take 1 tablet by mouth daily.      Calcium  Carbonate-Vitamin D3 600-400 MG-UNIT TABS Take 1 tablet by mouth 2 (two) times daily.      Multiple Vitamins-Minerals (CENTRUM SILVER 50+WOMEN PO) Take 1 tablet by mouth daily.      Omega-3 Fatty Acids (OMEGA-3 FISH OIL PO) Take 3 tablets by  mouth daily.     thiamine (VITAMIN B-1) 100 MG tablet Take 100 mg by mouth daily.     vitamin B-12 (CYANOCOBALAMIN ) 100 MCG tablet Take 100 mcg by mouth daily.     amLODipine  (NORVASC ) 10 MG tablet Take 1 tablet (10 mg total) by mouth daily. 90 tablet 1   hydrochlorothiazide  (MICROZIDE ) 12.5 MG capsule Take 1 capsule (12.5 mg total) by mouth daily. 90 capsule 1   irbesartan  (AVAPRO ) 300 MG tablet Take 1 tablet (300 mg total) by mouth daily. 90 tablet 1   No facility-administered medications prior to visit.    Allergies[1]     Objective:    BP 135/72 (BP Location: Left Arm, Patient Position: Sitting, Cuff Size: Normal)   Pulse 71   Ht 5' 3 (1.6 m)   Wt 148 lb 9.6 oz (67.4 kg)   SpO2 100%   BMI 26.32 kg/m  Wt Readings from Last 3 Encounters:  07/31/24 148 lb 9.6 oz (67.4 kg)  04/23/24 146 lb 12.8 oz (66.6 kg)  01/16/24 145 lb 9.6 oz (66 kg)    Physical Exam Vitals and nursing note reviewed.  Constitutional:      Appearance: She is well-developed.  HENT:     Head: Normocephalic and atraumatic.  Cardiovascular:     Rate and Rhythm: Normal rate and regular rhythm.     Heart sounds: Normal heart sounds. No murmur heard.    No friction rub. No gallop.  Pulmonary:     Effort: Pulmonary effort is normal. No tachypnea or respiratory distress.     Breath sounds: Normal breath sounds. No decreased breath sounds, wheezing, rhonchi or rales.  Chest:     Chest wall: No tenderness.  Musculoskeletal:        General: Normal range of motion.     Cervical back: Normal range of motion.  Skin:    General: Skin is warm and dry.  Neurological:     Mental Status: She is alert and oriented to person, place, and time.     Coordination: Coordination normal.  Psychiatric:        Behavior: Behavior normal. Behavior is cooperative.        Thought Content: Thought content normal.        Judgment: Judgment normal.          Patient has been counseled extensively about nutrition and  exercise as well as the importance of adherence with medications and regular follow-up. The patient was given clear instructions to go to ER or return to medical center if symptoms don't improve, worsen or new problems develop. The patient verbalized understanding.   Follow-up: Return in about 3 months (around 11/11/2024).   Haze LELON Servant, FNP-BC [facility] [place], [place] [phone]   07/31/2024, 10:05 AM     [1]  Allergies Allergen Reactions   Metoprolol Tartrate Other (See Comments)    Affects her mood/ lability   Cipro  [Ciprofloxacin  Hcl]     Joint and muscle pain   Codeine Other (See Comments)   Penicillins Other (See Comments)   Sulfa  Antibiotics     States she can take Bactrim  if she takes benadryl  prior to administration .

## 2024-08-01 LAB — CMP14+EGFR
ALT: 23 IU/L (ref 0–32)
AST: 24 IU/L (ref 0–40)
Albumin: 4.3 g/dL (ref 3.8–4.8)
Alkaline Phosphatase: 86 IU/L (ref 49–135)
BUN/Creatinine Ratio: 19 (ref 12–28)
BUN: 13 mg/dL (ref 8–27)
Bilirubin Total: 0.4 mg/dL (ref 0.0–1.2)
CO2: 25 mmol/L (ref 20–29)
Calcium: 10.5 mg/dL — ABNORMAL HIGH (ref 8.7–10.3)
Chloride: 101 mmol/L (ref 96–106)
Creatinine, Ser: 0.68 mg/dL (ref 0.57–1.00)
Globulin, Total: 3.5 g/dL (ref 1.5–4.5)
Glucose: 97 mg/dL (ref 70–99)
Potassium: 4.4 mmol/L (ref 3.5–5.2)
Sodium: 141 mmol/L (ref 134–144)
Total Protein: 7.8 g/dL (ref 6.0–8.5)
eGFR: 90 mL/min/1.73 (ref >59)

## 2024-08-01 LAB — LIPID PANEL
Chol/HDL Ratio: 4.1 ratio (ref 0.0–4.4)
Cholesterol, Total: 227 mg/dL — ABNORMAL HIGH (ref 100–199)
HDL: 56 mg/dL (ref >39)
LDL Chol Calc (NIH): 146 mg/dL — ABNORMAL HIGH (ref 0–99)
Triglycerides: 141 mg/dL (ref 0–149)
VLDL Cholesterol Cal: 25 mg/dL (ref 5–40)

## 2024-08-01 LAB — THYROID PANEL WITH TSH
Free Thyroxine Index: 3.1 (ref 1.2–4.9)
T3 Uptake Ratio: 25 % (ref 24–39)
T4, Total: 12.4 ug/dL — ABNORMAL HIGH (ref 4.5–12.0)
TSH: 6.67 u[IU]/mL — ABNORMAL HIGH (ref 0.450–4.500)

## 2024-08-03 ENCOUNTER — Ambulatory Visit: Payer: Self-pay | Admitting: Nurse Practitioner

## 2024-08-21 NOTE — Progress Notes (Signed)
 Michelle Sosa                                          MRN: [id number]   08/21/2024   The [facility] Quality Team Specialist reviewed this patient medical record for the purposes of chart review for care gap closure. The following were reviewed: abstraction for care gap closure-controlling blood pressure.    [facility] Quality Team

## 2024-10-29 ENCOUNTER — Ambulatory Visit: Payer: Self-pay | Admitting: Nurse Practitioner
